# Patient Record
Sex: Male | Born: 1940
Health system: Southern US, Community
[De-identification: ages and names within clinical notes are randomized; demographics above are authoritative.]

## PROBLEM LIST (undated history)

## (undated) DIAGNOSIS — C61 Malignant neoplasm of prostate: Secondary | ICD-10-CM

## (undated) DIAGNOSIS — D494 Neoplasm of unspecified behavior of bladder: Secondary | ICD-10-CM

## (undated) DIAGNOSIS — K219 Gastro-esophageal reflux disease without esophagitis: Secondary | ICD-10-CM

## (undated) DIAGNOSIS — R42 Dizziness and giddiness: Secondary | ICD-10-CM

## (undated) DIAGNOSIS — C801 Malignant (primary) neoplasm, unspecified: Secondary | ICD-10-CM

## (undated) DIAGNOSIS — Z87442 Personal history of urinary calculi: Secondary | ICD-10-CM

## (undated) DIAGNOSIS — C439 Malignant melanoma of skin, unspecified: Secondary | ICD-10-CM

## (undated) DIAGNOSIS — N200 Calculus of kidney: Secondary | ICD-10-CM

## (undated) DIAGNOSIS — M1990 Unspecified osteoarthritis, unspecified site: Secondary | ICD-10-CM

## (undated) DIAGNOSIS — I5189 Other ill-defined heart diseases: Secondary | ICD-10-CM

## (undated) DIAGNOSIS — L570 Actinic keratosis: Secondary | ICD-10-CM

## (undated) DIAGNOSIS — I00 Rheumatic fever without heart involvement: Secondary | ICD-10-CM

## (undated) DIAGNOSIS — I2510 Atherosclerotic heart disease of native coronary artery without angina pectoris: Secondary | ICD-10-CM

## (undated) DIAGNOSIS — Z86018 Personal history of other benign neoplasm: Secondary | ICD-10-CM

## (undated) DIAGNOSIS — I10 Essential (primary) hypertension: Secondary | ICD-10-CM

## (undated) DIAGNOSIS — G4730 Sleep apnea, unspecified: Secondary | ICD-10-CM

## (undated) DIAGNOSIS — K5790 Diverticulosis of intestine, part unspecified, without perforation or abscess without bleeding: Secondary | ICD-10-CM

## (undated) DIAGNOSIS — Z9889 Other specified postprocedural states: Secondary | ICD-10-CM

## (undated) DIAGNOSIS — G4733 Obstructive sleep apnea (adult) (pediatric): Secondary | ICD-10-CM

## (undated) DIAGNOSIS — Z7982 Long term (current) use of aspirin: Secondary | ICD-10-CM

## (undated) DIAGNOSIS — I700 Atherosclerosis of aorta: Secondary | ICD-10-CM

## (undated) DIAGNOSIS — R531 Weakness: Secondary | ICD-10-CM

## (undated) DIAGNOSIS — E785 Hyperlipidemia, unspecified: Secondary | ICD-10-CM

## (undated) HISTORY — PX: MELANOMA EXCISION: SHX5266

## (undated) HISTORY — PX: CHOLECYSTECTOMY: SHX55

## (undated) HISTORY — DX: Actinic keratosis: L57.0

---

## 2001-12-21 HISTORY — PX: PROSTATECTOMY: SHX69

## 2003-12-22 HISTORY — PX: BRAIN SURGERY: SHX531

## 2004-02-19 DIAGNOSIS — Z86018 Personal history of other benign neoplasm: Secondary | ICD-10-CM

## 2004-02-19 HISTORY — PX: CRANIOTOMY: SHX93

## 2004-02-19 HISTORY — DX: Personal history of other benign neoplasm: Z86.018

## 2004-06-13 HISTORY — PX: CRANIOTOMY FOR REPAIR DURAL / CSF LEAK: SUR338

## 2006-11-10 ENCOUNTER — Emergency Department: Payer: Self-pay | Admitting: Emergency Medicine

## 2008-07-10 ENCOUNTER — Emergency Department: Payer: Self-pay | Admitting: Emergency Medicine

## 2008-08-23 DIAGNOSIS — D239 Other benign neoplasm of skin, unspecified: Secondary | ICD-10-CM

## 2008-08-23 HISTORY — DX: Other benign neoplasm of skin, unspecified: D23.9

## 2009-08-21 DIAGNOSIS — C4491 Basal cell carcinoma of skin, unspecified: Secondary | ICD-10-CM

## 2009-08-21 HISTORY — DX: Basal cell carcinoma of skin, unspecified: C44.91

## 2009-10-19 ENCOUNTER — Emergency Department: Payer: Self-pay | Admitting: Emergency Medicine

## 2009-11-11 ENCOUNTER — Emergency Department: Payer: Self-pay | Admitting: Emergency Medicine

## 2009-11-21 ENCOUNTER — Ambulatory Visit: Payer: Self-pay | Admitting: Surgery

## 2010-12-21 HISTORY — PX: BLADDER SURGERY: SHX569

## 2011-01-02 ENCOUNTER — Emergency Department: Payer: Self-pay | Admitting: Unknown Physician Specialty

## 2011-02-02 ENCOUNTER — Ambulatory Visit: Payer: Self-pay | Admitting: Urology

## 2011-02-10 ENCOUNTER — Ambulatory Visit: Payer: Self-pay | Admitting: Urology

## 2011-06-08 ENCOUNTER — Ambulatory Visit: Payer: Self-pay | Admitting: Urology

## 2012-10-18 ENCOUNTER — Ambulatory Visit: Payer: Self-pay | Admitting: Surgery

## 2012-11-01 ENCOUNTER — Ambulatory Visit: Payer: Self-pay | Admitting: Surgery

## 2013-04-10 ENCOUNTER — Ambulatory Visit: Payer: Self-pay | Admitting: Gastroenterology

## 2014-02-14 ENCOUNTER — Emergency Department: Payer: Self-pay | Admitting: Emergency Medicine

## 2014-02-14 LAB — CBC
HCT: 47.2 % (ref 40.0–52.0)
HGB: 16.1 g/dL (ref 13.0–18.0)
MCH: 30.1 pg (ref 26.0–34.0)
MCHC: 34.1 g/dL (ref 32.0–36.0)
MCV: 88 fL (ref 80–100)
Platelet: 351 10*3/uL (ref 150–440)
RBC: 5.33 10*6/uL (ref 4.40–5.90)
RDW: 14 % (ref 11.5–14.5)
WBC: 17.6 10*3/uL — ABNORMAL HIGH (ref 3.8–10.6)

## 2014-02-14 LAB — COMPREHENSIVE METABOLIC PANEL
ALBUMIN: 3.6 g/dL (ref 3.4–5.0)
ALK PHOS: 60 U/L
Anion Gap: 5 — ABNORMAL LOW (ref 7–16)
BUN: 24 mg/dL — ABNORMAL HIGH (ref 7–18)
Bilirubin,Total: 0.3 mg/dL (ref 0.2–1.0)
CHLORIDE: 105 mmol/L (ref 98–107)
CREATININE: 1.25 mg/dL (ref 0.60–1.30)
Calcium, Total: 8.8 mg/dL (ref 8.5–10.1)
Co2: 29 mmol/L (ref 21–32)
EGFR (African American): >60
GFR CALC NON AF AMER: 58 — AB
Glucose: 100 mg/dL — ABNORMAL HIGH (ref 65–99)
Osmolality: 282 (ref 275–301)
Potassium: 3.4 mmol/L — ABNORMAL LOW (ref 3.5–5.1)
SGOT(AST): 32 U/L (ref 15–37)
SGPT (ALT): 40 U/L (ref 12–78)
SODIUM: 139 mmol/L (ref 136–145)
TOTAL PROTEIN: 8 g/dL (ref 6.4–8.2)

## 2014-02-14 LAB — URINALYSIS, COMPLETE: Specific Gravity: 1.026 (ref 1.003–1.030)

## 2015-01-25 DIAGNOSIS — H40003 Preglaucoma, unspecified, bilateral: Secondary | ICD-10-CM | POA: Diagnosis not present

## 2015-02-25 DIAGNOSIS — Z8546 Personal history of malignant neoplasm of prostate: Secondary | ICD-10-CM | POA: Diagnosis not present

## 2015-02-25 DIAGNOSIS — D333 Benign neoplasm of cranial nerves: Secondary | ICD-10-CM | POA: Diagnosis not present

## 2015-02-25 DIAGNOSIS — I10 Essential (primary) hypertension: Secondary | ICD-10-CM | POA: Diagnosis not present

## 2015-03-04 DIAGNOSIS — C61 Malignant neoplasm of prostate: Secondary | ICD-10-CM | POA: Diagnosis not present

## 2015-03-04 DIAGNOSIS — Z85820 Personal history of malignant melanoma of skin: Secondary | ICD-10-CM | POA: Insufficient documentation

## 2015-03-04 DIAGNOSIS — D333 Benign neoplasm of cranial nerves: Secondary | ICD-10-CM | POA: Diagnosis not present

## 2015-03-04 DIAGNOSIS — I10 Essential (primary) hypertension: Secondary | ICD-10-CM | POA: Diagnosis not present

## 2015-03-08 DIAGNOSIS — E042 Nontoxic multinodular goiter: Secondary | ICD-10-CM | POA: Diagnosis not present

## 2015-04-09 NOTE — Op Note (Signed)
PATIENT NAME:  Gregory [name], Gregory [name] MR#:  [id number] DATE OF BIRTH:  10/24/1941  DATE OF PROCEDURE:  11/01/2012  PREOPERATIVE DIAGNOSIS: Umbilical hernia.   POSTOPERATIVE DIAGNOSIS: Umbilical hernia.   PROCEDURE: Umbilical hernia repair.   SURGEON: Rochel Brome, M.D.   ANESTHESIA: General.   INDICATIONS: This 74 year old male has bulging at the navel and the patient was concerned about possible incarceration. He had physical findings of diastases recti, but also a localized hernia which is just above and slightly to the left of the umbilicus. This was some 3 cm in dimension. It appeared large enough to admit small bowel and repair was recommended for definitive treatment.   DESCRIPTION OF PROCEDURE: The patient was placed on the operating table in the supine position under general anesthesia. The periumbilical skin was clipped and then prepared with ChloraPrep and draped in a sterile manner. A transversely oriented suprapubic incision was made some 3.5 cm in length, carried down through subcutaneous tissues to encounter an umbilical hernia sac which was dissected free from surrounding structures and dissected down into a fascial ring defect. The sac itself was some 4 cm in dimension and was dissected free from the fascial ring defect and was inverted. The properitoneal fat was dissected away from the fascia circumferentially. The fascia itself was somewhat thin. There was a small defect just below this which was dissected so that some properitoneal fat was removed from a smaller defect. Next, an Atrium mesh was cut to create an oval shape of some 2 x 3 cm and was placed into the properitoneal plane, oriented transversely and sutured to the fascia below the smaller defect, closing the smaller defect with 0 Surgilon suture. Also, was sutured to the fascia on the cephalad side and did use a tiny pledget of the same mesh. Next, the repair was carried out further with a transversely oriented suture line of  interrupted 0 Surgilon figure-of-eight sutures incorporating each suture into the mesh. The repair looked good. Hemostasis was intact. Subcutaneous tissues were closed with running 4-0 chromic. The skin was closed with running 5-0 Monocryl   subcuticular suture and Dermabond. The patient tolerated surgery satisfactorily and was then prepared for transfer to the recovery room.   ____________________________ Lenna Sciara. Rochel Brome, MD jws:ap D: 11/01/2012 10:44:45 ET T: 11/01/2012 11:37:34 ET JOB#: [id number]  cc: Loreli Dollar, MD, <Dictator> Loreli Dollar MD ELECTRONICALLY SIGNED 11/02/2012 19:57

## 2015-04-27 DIAGNOSIS — R1032 Left lower quadrant pain: Secondary | ICD-10-CM | POA: Diagnosis not present

## 2015-05-02 DIAGNOSIS — R310 Gross hematuria: Secondary | ICD-10-CM | POA: Diagnosis not present

## 2015-05-02 DIAGNOSIS — C679 Malignant neoplasm of bladder, unspecified: Secondary | ICD-10-CM | POA: Diagnosis not present

## 2015-05-23 DIAGNOSIS — E042 Nontoxic multinodular goiter: Secondary | ICD-10-CM | POA: Diagnosis not present

## 2015-06-11 DIAGNOSIS — E042 Nontoxic multinodular goiter: Secondary | ICD-10-CM | POA: Diagnosis not present

## 2015-06-12 ENCOUNTER — Other Ambulatory Visit: Payer: Self-pay

## 2015-06-12 DIAGNOSIS — N3281 Overactive bladder: Secondary | ICD-10-CM

## 2015-06-12 MED ORDER — FESOTERODINE FUMARATE ER 8 MG PO TB24: 8.0000 mg | ORAL_TABLET | Freq: Every day | ORAL | Status: DC

## 2015-06-12 NOTE — Progress Notes (Signed)
Pt called stating he attempted to request a refill of toviaz 8mg  via pharmacy. Pharmacy told him they received he was no longer our pt. Per our records pt is a current pt. Per Mikey Kirschner 8mg  could be reordered. Medication was sent to pharmacy. Pt made aware. Cw,lpn

## 2015-06-13 ENCOUNTER — Other Ambulatory Visit: Payer: Self-pay | Admitting: *Deleted

## 2015-06-13 DIAGNOSIS — L570 Actinic keratosis: Secondary | ICD-10-CM | POA: Diagnosis not present

## 2015-06-13 DIAGNOSIS — Z85820 Personal history of malignant melanoma of skin: Secondary | ICD-10-CM | POA: Diagnosis not present

## 2015-06-13 DIAGNOSIS — D1800 Hemangioma unspecified site: Secondary | ICD-10-CM | POA: Diagnosis not present

## 2015-06-13 DIAGNOSIS — D179 Benign lipomatous neoplasm, unspecified: Secondary | ICD-10-CM | POA: Diagnosis not present

## 2015-06-13 DIAGNOSIS — N3281 Overactive bladder: Secondary | ICD-10-CM

## 2015-06-13 DIAGNOSIS — Z85828 Personal history of other malignant neoplasm of skin: Secondary | ICD-10-CM | POA: Diagnosis not present

## 2015-06-13 DIAGNOSIS — L821 Other seborrheic keratosis: Secondary | ICD-10-CM | POA: Diagnosis not present

## 2015-06-13 DIAGNOSIS — D485 Neoplasm of uncertain behavior of skin: Secondary | ICD-10-CM | POA: Diagnosis not present

## 2015-06-13 DIAGNOSIS — L578 Other skin changes due to chronic exposure to nonionizing radiation: Secondary | ICD-10-CM | POA: Diagnosis not present

## 2015-06-13 MED ORDER — FESOTERODINE FUMARATE ER 8 MG PO TB24: 8.0000 mg | ORAL_TABLET | Freq: Every day | ORAL | Status: DC

## 2015-09-27 DIAGNOSIS — Z23 Encounter for immunization: Secondary | ICD-10-CM | POA: Diagnosis not present

## 2015-10-14 DIAGNOSIS — I10 Essential (primary) hypertension: Secondary | ICD-10-CM | POA: Diagnosis not present

## 2015-10-14 DIAGNOSIS — D333 Benign neoplasm of cranial nerves: Secondary | ICD-10-CM | POA: Diagnosis not present

## 2015-10-14 DIAGNOSIS — Z125 Encounter for screening for malignant neoplasm of prostate: Secondary | ICD-10-CM | POA: Diagnosis not present

## 2015-10-14 DIAGNOSIS — T7840XA Allergy, unspecified, initial encounter: Secondary | ICD-10-CM | POA: Diagnosis not present

## 2015-10-21 DIAGNOSIS — I10 Essential (primary) hypertension: Secondary | ICD-10-CM | POA: Diagnosis not present

## 2015-10-21 DIAGNOSIS — T7840XA Allergy, unspecified, initial encounter: Secondary | ICD-10-CM | POA: Diagnosis not present

## 2015-10-21 DIAGNOSIS — Z0000 Encounter for general adult medical examination without abnormal findings: Secondary | ICD-10-CM | POA: Diagnosis not present

## 2015-10-21 DIAGNOSIS — C61 Malignant neoplasm of prostate: Secondary | ICD-10-CM | POA: Diagnosis not present

## 2015-10-21 DIAGNOSIS — J342 Deviated nasal septum: Secondary | ICD-10-CM | POA: Diagnosis not present

## 2015-10-21 DIAGNOSIS — M1612 Unilateral primary osteoarthritis, left hip: Secondary | ICD-10-CM | POA: Diagnosis not present

## 2015-12-04 DIAGNOSIS — Z85820 Personal history of malignant melanoma of skin: Secondary | ICD-10-CM | POA: Diagnosis not present

## 2015-12-04 DIAGNOSIS — L578 Other skin changes due to chronic exposure to nonionizing radiation: Secondary | ICD-10-CM | POA: Diagnosis not present

## 2015-12-04 DIAGNOSIS — D1800 Hemangioma unspecified site: Secondary | ICD-10-CM | POA: Diagnosis not present

## 2015-12-04 DIAGNOSIS — Z85828 Personal history of other malignant neoplasm of skin: Secondary | ICD-10-CM | POA: Diagnosis not present

## 2015-12-04 DIAGNOSIS — L570 Actinic keratosis: Secondary | ICD-10-CM | POA: Diagnosis not present

## 2015-12-04 DIAGNOSIS — Z1283 Encounter for screening for malignant neoplasm of skin: Secondary | ICD-10-CM | POA: Diagnosis not present

## 2015-12-04 DIAGNOSIS — L820 Inflamed seborrheic keratosis: Secondary | ICD-10-CM | POA: Diagnosis not present

## 2015-12-04 DIAGNOSIS — D485 Neoplasm of uncertain behavior of skin: Secondary | ICD-10-CM | POA: Diagnosis not present

## 2015-12-17 DIAGNOSIS — H04123 Dry eye syndrome of bilateral lacrimal glands: Secondary | ICD-10-CM | POA: Diagnosis not present

## 2015-12-24 DIAGNOSIS — H04123 Dry eye syndrome of bilateral lacrimal glands: Secondary | ICD-10-CM | POA: Diagnosis not present

## 2015-12-25 DIAGNOSIS — E042 Nontoxic multinodular goiter: Secondary | ICD-10-CM | POA: Diagnosis not present

## 2016-01-01 DIAGNOSIS — E042 Nontoxic multinodular goiter: Secondary | ICD-10-CM | POA: Diagnosis not present

## 2016-01-09 ENCOUNTER — Other Ambulatory Visit: Payer: Self-pay | Admitting: Urology

## 2016-01-09 DIAGNOSIS — N3281 Overactive bladder: Secondary | ICD-10-CM

## 2016-02-22 ENCOUNTER — Observation Stay
Admission: EM | Admit: 2016-02-22 | Discharge: 2016-02-25 | Disposition: A | Discharge status: Home / Self Care | Payer: Commercial Managed Care - HMO | Attending: Urology | Admitting: Urology

## 2016-02-22 ENCOUNTER — Encounter: Payer: Self-pay | Admitting: Emergency Medicine

## 2016-02-22 ENCOUNTER — Emergency Department: Payer: Commercial Managed Care - HMO

## 2016-02-22 DIAGNOSIS — D72829 Elevated white blood cell count, unspecified: Secondary | ICD-10-CM | POA: Insufficient documentation

## 2016-02-22 DIAGNOSIS — Z1639 Resistance to other specified antimicrobial drug: Secondary | ICD-10-CM | POA: Diagnosis not present

## 2016-02-22 DIAGNOSIS — B964 Proteus (mirabilis) (morganii) as the cause of diseases classified elsewhere: Secondary | ICD-10-CM | POA: Insufficient documentation

## 2016-02-22 DIAGNOSIS — K7689 Other specified diseases of liver: Secondary | ICD-10-CM | POA: Insufficient documentation

## 2016-02-22 DIAGNOSIS — N202 Calculus of kidney with calculus of ureter: Secondary | ICD-10-CM | POA: Diagnosis not present

## 2016-02-22 DIAGNOSIS — Z79899 Other long term (current) drug therapy: Secondary | ICD-10-CM | POA: Insufficient documentation

## 2016-02-22 DIAGNOSIS — Z888 Allergy status to other drugs, medicaments and biological substances status: Secondary | ICD-10-CM | POA: Insufficient documentation

## 2016-02-22 DIAGNOSIS — Z87442 Personal history of urinary calculi: Secondary | ICD-10-CM | POA: Diagnosis not present

## 2016-02-22 DIAGNOSIS — N200 Calculus of kidney: Secondary | ICD-10-CM | POA: Insufficient documentation

## 2016-02-22 DIAGNOSIS — R109 Unspecified abdominal pain: Secondary | ICD-10-CM | POA: Diagnosis not present

## 2016-02-22 DIAGNOSIS — K5730 Diverticulosis of large intestine without perforation or abscess without bleeding: Secondary | ICD-10-CM | POA: Diagnosis not present

## 2016-02-22 DIAGNOSIS — I10 Essential (primary) hypertension: Secondary | ICD-10-CM | POA: Diagnosis not present

## 2016-02-22 DIAGNOSIS — N179 Acute kidney failure, unspecified: Secondary | ICD-10-CM | POA: Diagnosis not present

## 2016-02-22 DIAGNOSIS — R509 Fever, unspecified: Secondary | ICD-10-CM | POA: Insufficient documentation

## 2016-02-22 DIAGNOSIS — N390 Urinary tract infection, site not specified: Secondary | ICD-10-CM | POA: Insufficient documentation

## 2016-02-22 DIAGNOSIS — Z91041 Radiographic dye allergy status: Secondary | ICD-10-CM | POA: Insufficient documentation

## 2016-02-22 DIAGNOSIS — K429 Umbilical hernia without obstruction or gangrene: Secondary | ICD-10-CM | POA: Insufficient documentation

## 2016-02-22 DIAGNOSIS — Z8546 Personal history of malignant neoplasm of prostate: Secondary | ICD-10-CM | POA: Insufficient documentation

## 2016-02-22 DIAGNOSIS — N201 Calculus of ureter: Secondary | ICD-10-CM | POA: Diagnosis present

## 2016-02-22 HISTORY — DX: Calculus of kidney: N20.0

## 2016-02-22 HISTORY — DX: Essential (primary) hypertension: I10

## 2016-02-22 LAB — CBC WITH DIFFERENTIAL/PLATELET
Basophils Absolute: 0.1 10*3/uL (ref 0–0.1)
Basophils Relative: 1 %
EOS ABS: 0.1 10*3/uL (ref 0–0.7)
EOS PCT: 1 %
HCT: 48.4 % (ref 40.0–52.0)
Hemoglobin: 16.4 g/dL (ref 13.0–18.0)
LYMPHS ABS: 1.6 10*3/uL (ref 1.0–3.6)
Lymphocytes Relative: 11 %
MCH: 30.4 pg (ref 26.0–34.0)
MCHC: 33.9 g/dL (ref 32.0–36.0)
MCV: 89.5 fL (ref 80.0–100.0)
MONO ABS: 0.9 10*3/uL (ref 0.2–1.0)
Monocytes Relative: 6 %
Neutro Abs: 11.5 10*3/uL — ABNORMAL HIGH (ref 1.4–6.5)
Neutrophils Relative %: 81 %
PLATELETS: 237 10*3/uL (ref 150–440)
RBC: 5.41 MIL/uL (ref 4.40–5.90)
RDW: 14 % (ref 11.5–14.5)
WBC: 14.1 10*3/uL — AB (ref 3.8–10.6)

## 2016-02-22 LAB — URINALYSIS COMPLETE WITH MICROSCOPIC (ARMC ONLY)
Bilirubin Urine: NEGATIVE
Glucose, UA: NEGATIVE mg/dL
Ketones, ur: NEGATIVE mg/dL
Nitrite: NEGATIVE
PH: 7 (ref 5.0–8.0)
Protein, ur: 100 mg/dL — AB
SPECIFIC GRAVITY, URINE: 1.017 (ref 1.005–1.030)

## 2016-02-22 LAB — BASIC METABOLIC PANEL
Anion gap: 8 (ref 5–15)
BUN: 23 mg/dL — AB (ref 6–20)
CALCIUM: 9.1 mg/dL (ref 8.9–10.3)
CO2: 28 mmol/L (ref 22–32)
CREATININE: 1.56 mg/dL — AB (ref 0.61–1.24)
Chloride: 104 mmol/L (ref 101–111)
GFR calc Af Amer: 49 mL/min — ABNORMAL LOW (ref >60)
GFR, EST NON AFRICAN AMERICAN: 42 mL/min — AB (ref >60)
GLUCOSE: 155 mg/dL — AB (ref 65–99)
Potassium: 3.7 mmol/L (ref 3.5–5.1)
SODIUM: 140 mmol/L (ref 135–145)

## 2016-02-22 MED ORDER — OXYCODONE HCL 5 MG PO TABS
5.0000 mg | ORAL_TABLET | ORAL | Status: DC | PRN
  Administered 2016-02-22: 10 mg via ORAL
  Filled 2016-02-22: qty 2

## 2016-02-22 MED ORDER — FESOTERODINE FUMARATE ER 4 MG PO TB24
8.0000 mg | ORAL_TABLET | Freq: Every day | ORAL | Status: DC
  Administered 2016-02-24 – 2016-02-25 (×2): 8 mg via ORAL
  Filled 2016-02-22: qty 2
  Filled 2016-02-22: qty 1
  Filled 2016-02-22: qty 2

## 2016-02-22 MED ORDER — ATENOLOL 25 MG PO TABS
25.0000 mg | ORAL_TABLET | Freq: Every day | ORAL | Status: DC
  Administered 2016-02-23 – 2016-02-25 (×3): 25 mg via ORAL
  Filled 2016-02-22 (×3): qty 1

## 2016-02-22 MED ORDER — MORPHINE SULFATE (PF) 4 MG/ML IV SOLN
6.0000 mg | Freq: Once | INTRAVENOUS | Status: AC
  Administered 2016-02-22: 6 mg via INTRAVENOUS
  Filled 2016-02-22: qty 1

## 2016-02-22 MED ORDER — LOSARTAN POTASSIUM-HCTZ 100-25 MG PO TABS: 1.0000 | ORAL_TABLET | ORAL | Status: DC

## 2016-02-22 MED ORDER — MORPHINE SULFATE (PF) 2 MG/ML IV SOLN
2.0000 mg | Freq: Once | INTRAVENOUS | Status: AC
  Administered 2016-02-22: 2 mg via INTRAVENOUS
  Filled 2016-02-22: qty 1

## 2016-02-22 MED ORDER — ONDANSETRON HCL 4 MG/2ML IJ SOLN
4.0000 mg | INTRAMUSCULAR | Status: DC | PRN
  Administered 2016-02-22 – 2016-02-23 (×3): 4 mg via INTRAVENOUS
  Filled 2016-02-22 (×3): qty 2

## 2016-02-22 MED ORDER — HYDROMORPHONE HCL 1 MG/ML IJ SOLN
0.5000 mg | INTRAMUSCULAR | Status: DC | PRN
  Administered 2016-02-22 (×2): 0.5 mg via INTRAVENOUS
  Filled 2016-02-22 (×2): qty 1

## 2016-02-22 MED ORDER — DOCUSATE SODIUM 100 MG PO CAPS
100.0000 mg | ORAL_CAPSULE | Freq: Two times a day (BID) | ORAL | Status: DC
  Administered 2016-02-22 – 2016-02-25 (×5): 100 mg via ORAL
  Filled 2016-02-22 (×5): qty 1

## 2016-02-22 MED ORDER — DEXTROSE 5 % IV SOLN
1.0000 g | Freq: Once | INTRAVENOUS | Status: AC
  Administered 2016-02-22: 1 g via INTRAVENOUS
  Filled 2016-02-22: qty 10

## 2016-02-22 MED ORDER — ONDANSETRON HCL 4 MG/2ML IJ SOLN
4.0000 mg | Freq: Once | INTRAMUSCULAR | Status: AC
  Administered 2016-02-22 (×2): 4 mg via INTRAVENOUS
  Filled 2016-02-22: qty 2

## 2016-02-22 MED ORDER — LACTATED RINGERS IV SOLN
INTRAVENOUS | Status: DC
  Administered 2016-02-22 – 2016-02-25 (×8): via INTRAVENOUS

## 2016-02-22 MED ORDER — ONDANSETRON HCL 4 MG/2ML IJ SOLN
INTRAMUSCULAR | Status: AC
  Administered 2016-02-22: 4 mg via INTRAVENOUS
  Filled 2016-02-22: qty 2

## 2016-02-22 MED ORDER — HYDROCHLOROTHIAZIDE 25 MG PO TABS
25.0000 mg | ORAL_TABLET | ORAL | Status: DC
  Administered 2016-02-24 – 2016-02-25 (×2): 25 mg via ORAL
  Filled 2016-02-22 (×3): qty 1

## 2016-02-22 MED ORDER — SODIUM CHLORIDE 0.9 % IV BOLUS (SEPSIS)
1000.0000 mL | Freq: Once | INTRAVENOUS | Status: AC
Start: 2016-02-22 — End: 2016-02-22
  Administered 2016-02-22: 1000 mL via INTRAVENOUS

## 2016-02-22 MED ORDER — TAMSULOSIN HCL 0.4 MG PO CAPS
0.4000 mg | ORAL_CAPSULE | Freq: Every day | ORAL | Status: DC
  Administered 2016-02-22 – 2016-02-25 (×3): 0.4 mg via ORAL
  Filled 2016-02-22 (×3): qty 1

## 2016-02-22 MED ORDER — LOSARTAN POTASSIUM 50 MG PO TABS
100.0000 mg | ORAL_TABLET | ORAL | Status: DC
  Administered 2016-02-24 – 2016-02-25 (×2): 100 mg via ORAL
  Filled 2016-02-22 (×3): qty 2

## 2016-02-22 MED ORDER — MORPHINE SULFATE (PF) 4 MG/ML IV SOLN
INTRAVENOUS | Status: AC
  Administered 2016-02-22: 6 mg via INTRAVENOUS
  Filled 2016-02-22: qty 1

## 2016-02-22 NOTE — H&P (Addendum)
Gregory [name] is an 75 y.o. male.   Chief Complaint: left ureteral stone HPI:   75 y.o. male with a history of kidney stones who presents with flank pain.  The pain started at 10 am and progressed.  This was located at the left flank and then he developed some radiation of the pain anteriorly.  He states his pain intensity worsened and had associated nausea but no vomiting.  He denies any fever or rigors at home. He has had 6 stones in the past and has not required intervention for these. His last stone episode was 15 to 20 years ago.  He has a history of prostate cancer treated in 2003 with radical prostatectomy. No radiation.  He also has a history of a benign bladder lesion noted in 2011.  He is not sure what it was specifically.    Past Medical History  Diagnosis Date  . Kidney stones   . Hypertension     History reviewed. No pertinent past surgical history.  History reviewed. No pertinent family history. Social History:  reports that he has never smoked. He does not have any smokeless tobacco history on file. He reports that he does not drink alcohol. His drug history is not on file.  Allergies:  Allergies  Allergen Reactions  . Iodine Anaphylaxis  . Other     Other reaction(s): Other (See Comments) Uncoded Allergy. Allergen: SHELLFISH, Other Reaction: FACIAL/THROAT SWELLING    Medications Prior to Admission  Medication Sig Dispense Refill  . atenolol (TENORMIN) 25 MG tablet Take 25 mg by mouth daily.    . fluorometholone (FML) 0.1 % ophthalmic suspension Place 1 drop into both eyes daily as needed. For dry/itchy eyes.    Marland Kitchen losartan-hydrochlorothiazide (HYZAAR) 100-25 MG tablet Take 1 tablet by mouth every morning.    . TOVIAZ 8 MG TB24 tablet TAKE ONE TABLET BY MOUTH EVERY DAY 30 each 3    Results for orders placed or performed during the hospital encounter of 02/22/16 (from the past 48 hour(s))  Urinalysis complete, with microscopic ([facility] only)     Status: Abnormal    Collection Time: 02/22/16 12:00 PM  Result Value Ref Range   Color, Urine YELLOW (A) YELLOW   APPearance CLOUDY (A) CLEAR   Glucose, UA NEGATIVE NEGATIVE mg/dL   Bilirubin Urine NEGATIVE NEGATIVE   Ketones, ur NEGATIVE NEGATIVE mg/dL   Specific Gravity, Urine 1.017 1.005 - 1.030   Hgb urine dipstick 3+ (A) NEGATIVE   pH 7.0 5.0 - 8.0   Protein, ur 100 (A) NEGATIVE mg/dL   Nitrite NEGATIVE NEGATIVE   Leukocytes, UA 3+ (A) NEGATIVE   RBC / HPF TOO NUMEROUS TO COUNT 0 - 5 RBC/hpf   WBC, UA TOO NUMEROUS TO COUNT 0 - 5 WBC/hpf   Bacteria, UA MANY (A) NONE SEEN   Squamous Epithelial / LPF 0-5 (A) NONE SEEN   WBC Clumps PRESENT    Mucous PRESENT    Hyaline Casts, UA PRESENT   CBC with Differential     Status: Abnormal   Collection Time: 02/22/16 12:00 PM  Result Value Ref Range   WBC 14.1 (H) 3.8 - 10.6 K/uL   RBC 5.41 4.40 - 5.90 MIL/uL   Hemoglobin 16.4 13.0 - 18.0 g/dL   HCT 48.4 40.0 - 52.0 %   MCV 89.5 80.0 - 100.0 fL   MCH 30.4 26.0 - 34.0 pg   MCHC 33.9 32.0 - 36.0 g/dL   RDW 14.0 11.5 - 14.5 %  Platelets 237 150 - 440 K/uL   Neutrophils Relative % 81 %   Neutro Abs 11.5 (H) 1.4 - 6.5 K/uL   Lymphocytes Relative 11 %   Lymphs Abs 1.6 1.0 - 3.6 K/uL   Monocytes Relative 6 %   Monocytes Absolute 0.9 0.2 - 1.0 K/uL   Eosinophils Relative 1 %   Eosinophils Absolute 0.1 0 - 0.7 K/uL   Basophils Relative 1 %   Basophils Absolute 0.1 0 - 0.1 K/uL  Basic metabolic panel     Status: Abnormal   Collection Time: 02/22/16 12:00 PM  Result Value Ref Range   Sodium 140 135 - 145 mmol/L   Potassium 3.7 3.5 - 5.1 mmol/L   Chloride 104 101 - 111 mmol/L   CO2 28 22 - 32 mmol/L   Glucose, Bld 155 (H) 65 - 99 mg/dL   BUN 23 (H) 6 - 20 mg/dL   Creatinine, Ser 1.56 (H) 0.61 - 1.24 mg/dL   Calcium 9.1 8.9 - 10.3 mg/dL   GFR calc non Af Amer 42 (L) >60 mL/min   GFR calc Af Amer 49 (L) >60 mL/min    Comment: (NOTE) The eGFR has been calculated using the CKD EPI equation. This  calculation has not been validated in all clinical situations. eGFR's persistently <60 mL/min signify possible Chronic Kidney Disease.    Anion gap 8 5 - 15   Ct Renal Stone Study  02/22/2016  CLINICAL DATA:  Acute left-sided flank pain since 10 o'clock this morning. EXAM: CT ABDOMEN AND PELVIS WITHOUT CONTRAST TECHNIQUE: Multidetector CT imaging of the abdomen and pelvis was performed following the standard protocol without IV contrast. COMPARISON:  02/14/2014; 07/10/2008 FINDINGS: The lack of intravenous contrast limits the ability to evaluate solid abdominal organs. There is a punctate (approximately 0.4 cm) stone within the superior aspect of the left ureter (representative axial image 44, series 2, coronal image 80, series 5) which results an very mild upstream ureterectasis, pelvicaliectasis and slightly asymmetric left-sided perinephric stranding. Note is made of an additional punctate (approximately 3 mm) nonobstructing stone within the interpolar aspect of the left kidney (image 34, series 2). No definite right-sided renal stones. No stones are seen along the expected course of the right ureter. Note is made of a right-sided extrarenal pelvis. No evidence of right-sided urinary obstruction. _____________________________________________________________ Normal hepatic contour. Approximately 4 cm hypo attenuating lesion within the dome of the left lobe of the liver (image 11, series 2) as well as the approximately 1.2 cm hypo attenuating lesion within the dome of the right lobe of the liver (image 12) and an approximately 0.8 cm hypoattenuating lesion also within the right lobe of the liver (image 15, series 2) are incompletely characterized without intravenous contrast though appear morphologically unchanged since 01/2014 examination and thus favored to represent hepatic cysts. Post cholecystectomy. No ascites. Normal noncontrast appearance of the bilateral adrenal glands, pancreas and spleen. Scattered  colonic diverticulosis without evidence of diverticulitis on this noncontrast examination. Normal noncontrast appearance of the terminal ileum and appendix. No pneumoperitoneum, pneumatosis or portal venous gas. Scattered atherosclerotic plaque within a normal caliber abdominal aorta. Post prostatectomy. Normal noncontrast appearance of the urinary bladder. No free fluid in the pelvic cul-de-sac. Limited visualization of lower thorax demonstrates minimal dependent subpleural ground-glass atelectasis, right greater than left. No focal airspace opacities. No pleural effusion. Normal heart size.  No pericardial effusion. Mild (< 25%) compression deformity involving the superior endplate of the P10 vertebral body is grossly unchanged since the  01/2014 examination. No acute or aggressive osseous abnormalities. Stigmata of DISH within the caudal aspect of the thoracic spine. Small (approximately 2.0 x 2.5 cm mesenteric fat containing periumbilical hernia appears grossly unchanged since the 01/2014 examination. Regional soft tissues appear otherwise normal. IMPRESSION: 1. Punctate (approximately 4 mm) stone within the superior aspect of the left ureter results in very mild upstream ureterectasis and pelvicaliectasis. 2. Additional punctate (approximately 3 mm) nonobstructing left-sided renal stone. 3. No evidence of right-sided nephrolithiasis urinary obstruction. 4. Colonic diverticulosis without evidence of diverticulitis. Electronically Signed   By: Sandi Mariscal M.D.   On: 02/22/2016 13:08    Review of Systems  Constitutional: Negative for fever, chills, weight loss and malaise/fatigue.  HENT: Negative for ear pain and hearing loss.   Eyes: Negative for blurred vision.  Respiratory: Negative for cough, hemoptysis, wheezing and stridor.   Cardiovascular: Negative for chest pain, palpitations and orthopnea.  Gastrointestinal: Positive for nausea. Negative for heartburn, vomiting, abdominal pain and diarrhea.   Genitourinary: Positive for urgency, frequency and flank pain. Negative for dysuria and hematuria.  Musculoskeletal: Negative for myalgias, back pain and neck pain.  Skin: Negative for itching and rash.  Neurological: Negative for dizziness, seizures and headaches.  Endo/Heme/Allergies: Bruises/bleeds easily.  Psychiatric/Behavioral: Negative for depression and suicidal ideas.    BP 161/80 mmHg  Pulse 108  Temp(Src) 97.7 F (36.5 C) (Oral)  Resp 18  Ht 5' 8"  (1.727 m)  Wt 92.534 kg (204 lb)  BMI 31.03 kg/m2  SpO2 99%   General: well-developed male, in no acute distress HEENT: PERRL, EOMI, OP moist without lesions Neck: supple, trachea midline; no thyromegaly Chest: normal to palpation Lungs: clear bilaterally without wheeze or retractions Cardiac: RRR without murmurs, gallops, rubs; distal pulses 2+ Abd: Soft, nontender, nondistended, positive bowel sounds.  No organomegaly. He has an infrapubic incision that is well healed.   Ext: No clubbing, cyanosis, edema Neuro: CN grossly intact. Moves all extremities Derm: no significant rashes or nodules Lymph: no cervical or supraclavicular nodes    Assessment/Plan  1) Left ureteral stone  75 y.o. male with a 4 mm left ureteral stone in the mid ureter. He has intractable pain and a mildly elevated WBC. His UA is suggestive of a possible infection but has negative nitrite. I discussed with him the options regarding management of an acute stone episode including observation and ureteral stenting.  Given he has now improved pain control and he has not had fevers, he wishes to forego surgical intervention.    We discussed the parameters that would require that we proceed to the OR for ureteral stenting and that includes fevers, chills, intractable pain, vomiting.  We will monitor him overnight and make a final decision tomorrow. If his WBC is improved and his pain is controlled then will proceed with discharge and close outpatient  followup.   Will start flomax, will strain urine  2) AKI -   AKI likely due to dehydration will treat with IV fluids.  3) Hepatic Cysts  He has incidental cysts in his liver and will have his PCP follow up on these.    I spent >60 minutes in face to face discussion with the patient and his wife.    Pau Banh J Madden-Fuentes 02/22/2016, 5:23 PM

## 2016-02-22 NOTE — ED Provider Notes (Signed)
[facility] Emergency Department Provider Note    ____________________________________________  Time seen: ~1215  I have reviewed the triage vital signs and the nursing notes.   HISTORY  Chief Complaint Flank Pain   History limited by: Not Limited   HPI Gregory [name] is a 75 y.o. male who presents to the emergency department today because of concerns for left flank pain. It started suddenly. It started roughly 2 hours ago. The pain is severe. He describes it as sharp. It does not radiate. He did have some associated nausea and vomiting. He states he has a history of kidney stones and this reminds him of previous kidney stone pain. He did not notice any change in urination however. No change in defecation. No fevers.     Past Medical History  Diagnosis Date  . Kidney stones   . Hypertension     There are no active problems to display for this patient.   History reviewed. No pertinent past surgical history.  Current Outpatient Rx  Name  Route  Sig  Dispense  Refill  . TOVIAZ 8 MG TB24 tablet      TAKE ONE TABLET BY MOUTH EVERY DAY   30 each   3     Allergies Iodine  History reviewed. No pertinent family history.  Social History Social History  Substance Use Topics  . Smoking status: Never Smoker   . Smokeless tobacco: None  . Alcohol Use: No    Review of Systems  Constitutional: Negative for fever. Cardiovascular: Negative for chest pain. Respiratory: Negative for shortness of breath. Gastrointestinal: Positive for left flank pain, nausea and vomiting Genitourinary: Negative for dysuria. Musculoskeletal: Negative for back pain. Skin: Negative for rash. Neurological: Negative for headaches, focal weakness or numbness.  10-point ROS otherwise negative.  ____________________________________________   PHYSICAL EXAM:  VITAL SIGNS: ED Triage Vitals  Enc Vitals Group     BP 02/22/16 1151 177/95 mmHg     Pulse Rate  02/22/16 1151 68     Resp 02/22/16 1151 20     Temp 02/22/16 1151 98.2 F (36.8 C)     Temp Source 02/22/16 1151 Oral     SpO2 02/22/16 1151 100 %     Weight 02/22/16 1151 204 lb (92.534 kg)     Height 02/22/16 1151 5\' 8"  (1.727 m)     Head Cir --      Peak Flow --      Pain Score 02/22/16 1151 7   Constitutional: Alert and oriented. Appears uncomfortable. Eyes: Conjunctivae are normal. PERRL. Normal extraocular movements. ENT   Head: Normocephalic and atraumatic.   Nose: No congestion/rhinnorhea.   Mouth/Throat: Mucous membranes are moist.   Neck: No stridor. Hematological/Lymphatic/Immunilogical: No cervical lymphadenopathy. Cardiovascular: Normal rate, regular rhythm.  No murmurs, rubs, or gallops. Respiratory: Normal respiratory effort without tachypnea nor retractions. Breath sounds are clear and equal bilaterally. No wheezes/rales/rhonchi. Gastrointestinal: Soft and nontender. No distention.  Genitourinary: Deferred Musculoskeletal: Normal range of motion in all extremities. No joint effusions.  No lower extremity tenderness nor edema. Neurologic:  Normal speech and language. No gross focal neurologic deficits are appreciated.  Skin:  Skin is warm, dry and intact. No rash noted. Psychiatric: Mood and affect are normal. Speech and behavior are normal. Patient exhibits appropriate insight and judgment.  ____________________________________________    LABS (pertinent positives/negatives)  Labs Reviewed  URINALYSIS COMPLETEWITH MICROSCOPIC ([place] ONLY) - Abnormal; Notable for the following:    Color, Urine YELLOW (*)  APPearance CLOUDY (*)    Hgb urine dipstick 3+ (*)    Protein, ur 100 (*)    Leukocytes, UA 3+ (*)    Bacteria, UA MANY (*)    Squamous Epithelial / LPF 0-5 (*)    All other components within normal limits  CBC WITH DIFFERENTIAL/PLATELET - Abnormal; Notable for the following:    WBC 14.1 (*)    Neutro Abs 11.5 (*)    All other components  within normal limits  BASIC METABOLIC PANEL - Abnormal; Notable for the following:    Glucose, Bld 155 (*)    BUN 23 (*)    Creatinine, Ser 1.56 (*)    GFR calc non Af Amer 42 (*)    GFR calc Af Amer 49 (*)    All other components within normal limits     ____________________________________________   EKG  None  ____________________________________________    RADIOLOGY  CT renal IMPRESSION: 1. Punctate (approximately 4 mm) stone within the superior aspect of the left ureter results in very mild upstream ureterectasis and pelvicaliectasis. 2. Additional punctate (approximately 3 mm) nonobstructing left-sided renal stone. 3. No evidence of right-sided nephrolithiasis urinary obstruction. 4. Colonic diverticulosis without evidence of diverticulitis.   ____________________________________________   PROCEDURES  Procedure(s) performed: None  Critical Care performed: No  ____________________________________________   INITIAL IMPRESSION / ASSESSMENT AND PLAN / ED COURSE  Pertinent labs & imaging results that were available during my care of the patient were reviewed by me and considered in my medical decision making (see chart for details).  Patient presented to the emergency department today because of concerns for left flank pain. Urine was concerning for both too numerous to count red and white blood cells. Because of this a CT renal study was obtained which did show a kidney stone in the left ureter. Given my concern for infections patient was written for antibiotics. Additionally I discussed with urology who will plan on admitting patient for further care.  ____________________________________________   FINAL CLINICAL IMPRESSION(S) / ED DIAGNOSES  Final diagnoses:  Left flank pain  Kidney stone     Nance Pear, MD 02/22/16 1559

## 2016-02-22 NOTE — Care Management Obs Status (Signed)
[place] NOTIFICATION   Patient Details  Name: Gregory [name] MRN: [id number] Date of Birth: Nov 26, 1941   Medicare Observation Status Notification Given:  Yes (ureteral stone)    Ival Bible, RN 02/22/2016, 2:28 PM

## 2016-02-22 NOTE — ED Notes (Signed)
Pt to ed with c/o acute onset of left flank pain that started about 1 1/2 hours ago.  Hx of kidney stones.

## 2016-02-23 ENCOUNTER — Encounter
Admission: EM | Disposition: A | Discharge status: Home / Self Care | Payer: Self-pay | Source: Home / Self Care | Attending: Emergency Medicine

## 2016-02-23 ENCOUNTER — Encounter: Payer: Self-pay | Admitting: Anesthesiology

## 2016-02-23 ENCOUNTER — Observation Stay: Payer: Commercial Managed Care - HMO | Admitting: Anesthesiology

## 2016-02-23 DIAGNOSIS — N201 Calculus of ureter: Secondary | ICD-10-CM | POA: Diagnosis not present

## 2016-02-23 DIAGNOSIS — I10 Essential (primary) hypertension: Secondary | ICD-10-CM | POA: Diagnosis not present

## 2016-02-23 HISTORY — PX: CYSTOSCOPY WITH STENT PLACEMENT: SHX5790

## 2016-02-23 LAB — BASIC METABOLIC PANEL
ANION GAP: 10 (ref 5–15)
BUN: 27 mg/dL — ABNORMAL HIGH (ref 6–20)
CALCIUM: 8.3 mg/dL — AB (ref 8.9–10.3)
CHLORIDE: 103 mmol/L (ref 101–111)
CO2: 27 mmol/L (ref 22–32)
CREATININE: 2.09 mg/dL — AB (ref 0.61–1.24)
GFR calc Af Amer: 34 mL/min — ABNORMAL LOW (ref >60)
GFR calc non Af Amer: 30 mL/min — ABNORMAL LOW (ref >60)
GLUCOSE: 124 mg/dL — AB (ref 65–99)
Potassium: 3.4 mmol/L — ABNORMAL LOW (ref 3.5–5.1)
Sodium: 140 mmol/L (ref 135–145)

## 2016-02-23 LAB — CBC
HCT: 41.7 % (ref 40.0–52.0)
HEMOGLOBIN: 14.4 g/dL (ref 13.0–18.0)
MCH: 30.9 pg (ref 26.0–34.0)
MCHC: 34.7 g/dL (ref 32.0–36.0)
MCV: 89.2 fL (ref 80.0–100.0)
Platelets: 141 10*3/uL — ABNORMAL LOW (ref 150–440)
RBC: 4.67 MIL/uL (ref 4.40–5.90)
RDW: 14.2 % (ref 11.5–14.5)
WBC: 19 10*3/uL — ABNORMAL HIGH (ref 3.8–10.6)

## 2016-02-23 SURGERY — REMOVAL, STENT, URETER, CYSTOSCOPIC
Anesthesia: General | Laterality: Left

## 2016-02-23 SURGERY — CYSTOSCOPY, WITH STENT INSERTION
Anesthesia: General | Laterality: Left | Wound class: Clean Contaminated

## 2016-02-23 SURGERY — CYSTOSCOPY, WITH STENT INSERTION
Anesthesia: Choice | Laterality: Left

## 2016-02-23 MED ORDER — OXYCODONE HCL 5 MG PO TABS: 5.0000 mg | ORAL_TABLET | Freq: Once | ORAL | Status: DC | PRN

## 2016-02-23 MED ORDER — FENTANYL CITRATE (PF) 100 MCG/2ML IJ SOLN: 25.0000 ug | INTRAMUSCULAR | Status: DC | PRN

## 2016-02-23 MED ORDER — ONDANSETRON HCL 4 MG/2ML IJ SOLN
INTRAMUSCULAR | Status: DC | PRN
  Administered 2016-02-23: 4 mg via INTRAVENOUS

## 2016-02-23 MED ORDER — DEXTROSE 5 % IV SOLN
1.0000 g | INTRAVENOUS | Status: DC
  Administered 2016-02-23 – 2016-02-24 (×2): 1 g via INTRAVENOUS
  Filled 2016-02-23 (×3): qty 10

## 2016-02-23 MED ORDER — PHENYLEPHRINE HCL 10 MG/ML IJ SOLN
INTRAMUSCULAR | Status: DC | PRN
  Administered 2016-02-23: 200 ug via INTRAVENOUS
  Administered 2016-02-23 (×2): 100 ug via INTRAVENOUS
  Administered 2016-02-23: 150 ug via INTRAVENOUS

## 2016-02-23 MED ORDER — CEFAZOLIN SODIUM 1 G IJ SOLR
INTRAMUSCULAR | Status: DC | PRN
  Administered 2016-02-23: 2 g via INTRAMUSCULAR

## 2016-02-23 MED ORDER — OXYCODONE HCL 5 MG/5ML PO SOLN: 5.0000 mg | Freq: Once | ORAL | Status: DC | PRN

## 2016-02-23 MED ORDER — DEXAMETHASONE SODIUM PHOSPHATE 10 MG/ML IJ SOLN
INTRAMUSCULAR | Status: DC | PRN
  Administered 2016-02-23: 10 mg via INTRAVENOUS

## 2016-02-23 SURGICAL SUPPLY — 23 items
BAG DRAIN CYSTO-URO LG1000N (MISCELLANEOUS) ×3 IMPLANT
BAG URO DRAIN 2000ML W/SPOUT (MISCELLANEOUS) ×3 IMPLANT
CATH FOL 2WAY LX 16X5 (CATHETERS) ×3 IMPLANT
CATH URETL 5X70 OPEN END (CATHETERS) ×3 IMPLANT
CONRAY 43 FOR UROLOGY 50M (MISCELLANEOUS) IMPLANT
GLOVE BIO SURGEON STRL SZ7 (GLOVE) ×12 IMPLANT
GLOVE BIO SURGEON STRL SZ7.5 (GLOVE) IMPLANT
GOWN STRL REUS W/ TWL LRG LVL4 (GOWN DISPOSABLE) ×2 IMPLANT
GOWN STRL REUS W/TWL LRG LVL4 (GOWN DISPOSABLE) ×4
GUIDEWIRE STR ZIPWIRE 035X150 (MISCELLANEOUS) ×3 IMPLANT
IV NS 1000ML (IV SOLUTION) ×4
IV NS 1000ML BAXH (IV SOLUTION) ×2 IMPLANT
KIT RM TURNOVER CYSTO AR (KITS) ×3 IMPLANT
PACK CYSTO AR (MISCELLANEOUS) ×3 IMPLANT
PREP PVP WINGED SPONGE (MISCELLANEOUS) IMPLANT
SET CYSTO W/LG BORE CLAMP LF (SET/KITS/TRAYS/PACK) ×3 IMPLANT
SOL .9 NS 3000ML IRR  AL (IV SOLUTION)
SOL .9 NS 3000ML IRR UROMATIC (IV SOLUTION) IMPLANT
SOL PREP PVP 2OZ (MISCELLANEOUS) ×3
SOLUTION PREP PVP 2OZ (MISCELLANEOUS) ×1 IMPLANT
STENT URET 6FRX24 CONTOUR (STENTS) IMPLANT
STENT URET 6FRX26 CONTOUR (STENTS) ×3 IMPLANT
WATER STERILE IRR 1000ML POUR (IV SOLUTION) ×3 IMPLANT

## 2016-02-23 NOTE — Anesthesia Procedure Notes (Signed)
Procedure Name: LMA Insertion Date/Time: 02/23/2016 11:01 AM Performed by: Jonna Clark Pre-anesthesia Checklist: Patient identified, Patient being monitored, Timeout performed, Emergency Drugs available and Suction available Patient Re-evaluated:Patient Re-evaluated prior to inductionOxygen Delivery Method: Circle system utilized Preoxygenation: Pre-oxygenation with 100% oxygen Intubation Type: IV induction Ventilation: Mask ventilation without difficulty LMA: LMA inserted LMA Size: 4.0 Tube type: Oral Number of attempts: 1 Placement Confirmation: positive ETCO2 and breath sounds checked- equal and bilateral Tube secured with: Tape Dental Injury: Teeth and Oropharynx as per pre-operative assessment

## 2016-02-23 NOTE — Transfer of Care (Signed)
Immediate Anesthesia Transfer of Care Note  Patient: Gregory [name]  Procedure(s) Performed: Procedure(s): CYSTOSCOPY WITH STENT PLACEMENT (Left)  Patient Location: PACU  Anesthesia Type:General  Level of Consciousness: patient cooperative and lethargic  Airway & Oxygen Therapy: Patient Spontanous Breathing and Patient connected to face mask oxygen  Post-op Assessment: Report given to RN and Post -op Vital signs reviewed and stable  Post vital signs: Reviewed and stable  Last Vitals:  Filed Vitals:   02/23/16 1136 02/23/16 1138  BP: 90/57 90/57  Pulse: 81   Temp:  36.9 C  Resp: 11 16    Complications: No apparent anesthesia complications

## 2016-02-23 NOTE — Op Note (Signed)
PREOPERATIVE DIAGNOSIS:  Left Ureteral stone   POSTOPERATIVE DIAGNOSIS:  Left ureteral stone   SURGERY DATE: 02/23/2016  PROCEDURE:  1) Cystourethroscopy  2) Left ureteral stent placement 3) Fluoroscopy   ATTENDING SURGEON:  . Delmer Islam Madden-Fuentes, MD   ANESTHESIA:  * No anesthesia type entered *   ESTIMATED BLOOD LOSS:  minimal  SPECIMENS:  None.   DRAINS:  A 6-French x 26 cm  double-J ureteral stent.   INDICATIONS FOR PROCEDURE:   This is a 75 y.o. male who was admitted yesterday with a left ureteral stone and pain.  His WBC was borderline and thus was kept for pain control and monitoring. This AM, his blood work revealed a rise in creatinine and rise in WBC.  The decision was made to proceed to the OR for stent placement  We discussed the risks associated with surgery including bleeding, infection, damage to surrounding structures, need for secondary procedures. He is aware there is a risk of heart attack, stroke, DVTs, and even death.     DESCRIPTION OF PROCEDURE IN DETAIL:  After informed consent was obtained the patient was brought to the operative theater and placed on the operating room table in the supine position.  Anesthesia was induced.  The patient was then placed in the dorsal lithotomy position. The patient was prepped and draped in the usual sterile fashion.  The patient was given appropriate antibiotics within 1 hour at the start of the case.  Bilateral sequential compression devices were placed on his legs for DVT prophylaxis.  A surgical time-out occurred with all in the room in agreement with the surgery to be performed and the site of surgery.     A 23-French rigid cystoscope was placed in the patient's bladder via his urethra.  The urethra was noted to be normal with no stricture or scarring.  Upon entry into the bladder it was evaluated, and found to be normal.  The patient's left ureteral orifice was identified, and a 0.038 Sensor wire was used to cannulate  it.  A 5 french open ended ureteral catheter was then advanced into the ureter over the wire. The wire was removed and an urine sample collected.  A sensor guidewire was then advanced through the ureteral catheter and placed up the ureter and advanced to the level of the renal pelvis under fluoroscopic guidance.    Next a 6-French x 26 cm double-J ureteral stent was placed over the wire and advanced to the level of the renal pelvis under fluoroscopic guidance.  When the stent was noted to be within the correct position the Sensor wire was removed, and a good coil was seen within the renal pelvis by fluoroscopy as well as within the bladder both by fluoroscopy and by direct vision with the use of the cystoscope.  The patient's bladder was then drained, the cystoscope was removed.  The patient tolerated the procedure well, and postoperatively was transported to the PACU in stable condition.

## 2016-02-23 NOTE — Progress Notes (Signed)
Subjective: Gregory [name] is a 75 y.o. male patient admitted for left ureteral stone.  His UA was positive for a possible infection and thus he was admitted for observation.  He did well overnight without fevers. He's been urinating. His major complaint is dry mouth and being hungry. He was kept NPO in case he needed to go to the operating room today for a left ureteral stent placement.   Objective: Filed Vitals:   02/22/16 2051 02/23/16 0616  BP: 112/69 123/63  Pulse: 98 99  Temp: 97.8 F (36.6 C) 98.1 F (36.7 C)  Resp: 16 16    General: well-developed male, in no acute distress HEENT: Atraumatic, moist membranes Neck: supple, trachea midline Lungs: moving air without restriction and without use of accessory muscles Cardiac: Regular rate Abd: Soft, nontender, nondistended Ext: No edema Neuro: CN grossly intact. Moves all extremities   Lab Results  Component Value Date   CREATININE 2.09* 02/23/2016   CREATININE 1.56* 02/22/2016   CREATININE 1.25 02/14/2014   CBC Latest Ref Rng 02/23/2016 02/22/2016 02/14/2014  WBC 3.8 - 10.6 K/uL 19.0(H) 14.1(H) 17.6(H)  Hemoglobin 13.0 - 18.0 g/dL 14.4 16.4 16.1  Hematocrit 40.0 - 52.0 % 41.7 48.4 47.2  Platelets 150 - 440 K/uL 141(L) 237 351    Allergies: Allergies  Allergen Reactions  . Iodine Anaphylaxis  . Other     Other reaction(s): Other (See Comments) Uncoded Allergy. Allergen: SHELLFISH, Other Reaction: FACIAL/THROAT SWELLING     Assessment/Plan: Left ureteral stone.   He has had a rise in his creatinine and his WBC is now 19k. We discussed at length that this is concerning for infection behind his ureteral stone.  Thus, we will need to proceed with a left ureteral stent placement. I have discussed this at length his him and his wife. They both understand the importance of proceeding to ensure that he does not develop worsening infection and possible sepsis.  We discussed the risks associated with surgery including  bleeding, infection, damage to surrounding structures, need for secondary procedures. He is aware there is a risk of heart attack, stroke, DVTs, and even death.    I have obtained informed consent from him.    Aviva Signs, MD 02/23/2016

## 2016-02-23 NOTE — Anesthesia Postprocedure Evaluation (Signed)
Anesthesia Post Note  Patient: Terrie Kollin Shultis  Procedure(s) Performed: Procedure(s) (LRB): CYSTOSCOPY WITH STENT PLACEMENT (Left)  Patient location during evaluation: PACU Anesthesia Type: General Level of consciousness: awake and alert Pain management: pain level controlled Vital Signs Assessment: post-procedure vital signs reviewed and stable Respiratory status: spontaneous breathing, nonlabored ventilation, respiratory function stable and patient connected to nasal cannula oxygen Cardiovascular status: blood pressure returned to baseline and stable Postop Assessment: no signs of nausea or vomiting Anesthetic complications: no    Last Vitals:  Filed Vitals:   02/23/16 1206 02/23/16 1217  BP:    Pulse: 80 80  Temp:  36.8 C  Resp: 12 12    Last Pain:  Filed Vitals:   02/23/16 1226  PainSc: 0-No pain                 Precious Haws Raziah Funnell

## 2016-02-23 NOTE — OR Nursing (Signed)
Urine appears bloody no clotts noted

## 2016-02-23 NOTE — Anesthesia Preprocedure Evaluation (Signed)
Anesthesia Evaluation  Patient identified by MRN, date of birth, ID band Patient awake    Reviewed: Allergy & Precautions, H&P , NPO status , Patient's Chart, lab work & pertinent test results  History of Anesthesia Complications Negative for: history of anesthetic complications  Airway Mallampati: III  TM Distance: >3 FB Neck ROM: limited    Dental  (+) Poor Dentition, Chipped   Pulmonary neg pulmonary ROS, neg shortness of breath,    Pulmonary exam normal breath sounds clear to auscultation       Cardiovascular Exercise Tolerance: Good hypertension, (-) angina(-) Past MI and (-) DOE Normal cardiovascular exam Rhythm:regular Rate:Normal     Neuro/Psych negative neurological ROS  negative psych ROS   GI/Hepatic negative GI ROS, Neg liver ROS, neg GERD  ,  Endo/Other  negative endocrine ROS  Renal/GU Renal diseasenegative Renal ROS  negative genitourinary   Musculoskeletal   Abdominal   Peds  Hematology negative hematology ROS (+)   Anesthesia Other Findings Past Medical History:   Kidney stones                                                Hypertension                                                History reviewed. No pertinent surgical history.  BMI    Body Mass Index   31.02 kg/m 2    Signs and symptoms suggestive of sleep apnea    Reproductive/Obstetrics negative OB ROS                             Anesthesia Physical Anesthesia Plan  ASA: III  Anesthesia Plan: General LMA   Post-op Pain Management:    Induction:   Airway Management Planned:   Additional Equipment:   Intra-op Plan:   Post-operative Plan:   Informed Consent: I have reviewed the patients History and Physical, chart, labs and discussed the procedure including the risks, benefits and alternatives for the proposed anesthesia with the patient or authorized representative who has indicated his/her  understanding and acceptance.   Dental Advisory Given  Plan Discussed with: Anesthesiologist, CRNA and Surgeon  Anesthesia Plan Comments:         Anesthesia Quick Evaluation

## 2016-02-23 NOTE — Brief Op Note (Signed)
02/22/2016 - 02/23/2016  11:31 AM  PATIENT:  Gregory [name]  75 y.o. male  PRE-OPERATIVE DIAGNOSIS:  left ureteral stone  POST-OPERATIVE DIAGNOSIS: left ureteral stone  PROCEDURE:  Procedure(s): CYSTOSCOPY WITH STENT PLACEMENT (Left)  SURGEON:  Surgeon(s) and Role:    * Dereck Leep, MD - Primary  PHYSICIAN ASSISTANT:   ANESTHESIA:   general  EBL:  Total I/O In: 627 [I.V.:627] Out: -   BLOOD ADMINISTERED:none  DRAINS: Urinary Catheter (Foley)    SPECIMEN:  Source of Specimen:  urine  DISPOSITION OF SPECIMEN:  N/A  COUNTS:  YES  TOURNIQUET:  * No tourniquets in log *  DICTATION: .Dragon Dictation  PLAN OF CARE: Admit to inpatient   PATIENT DISPOSITION:  PACU - hemodynamically stable.   Delay start of Pharmacological VTE agent (>24hrs) due to surgical blood loss or risk of bleeding: no

## 2016-02-24 ENCOUNTER — Encounter: Payer: Self-pay | Admitting: Urology

## 2016-02-24 DIAGNOSIS — N200 Calculus of kidney: Secondary | ICD-10-CM | POA: Diagnosis not present

## 2016-02-24 DIAGNOSIS — N201 Calculus of ureter: Secondary | ICD-10-CM | POA: Diagnosis not present

## 2016-02-24 DIAGNOSIS — R109 Unspecified abdominal pain: Secondary | ICD-10-CM

## 2016-02-24 LAB — URINE CULTURE: Culture: NO GROWTH

## 2016-02-24 LAB — CBC
HCT: 37.4 % — ABNORMAL LOW (ref 40.0–52.0)
HEMOGLOBIN: 12.7 g/dL — AB (ref 13.0–18.0)
MCH: 30.3 pg (ref 26.0–34.0)
MCHC: 34 g/dL (ref 32.0–36.0)
MCV: 89.2 fL (ref 80.0–100.0)
PLATELETS: 149 10*3/uL — AB (ref 150–440)
RBC: 4.19 MIL/uL — AB (ref 4.40–5.90)
RDW: 14.9 % — ABNORMAL HIGH (ref 11.5–14.5)
WBC: 25.2 10*3/uL — ABNORMAL HIGH (ref 3.8–10.6)

## 2016-02-24 LAB — BASIC METABOLIC PANEL
Anion gap: 8 (ref 5–15)
BUN: 33 mg/dL — AB (ref 6–20)
CHLORIDE: 103 mmol/L (ref 101–111)
CO2: 26 mmol/L (ref 22–32)
Calcium: 7.9 mg/dL — ABNORMAL LOW (ref 8.9–10.3)
Creatinine, Ser: 1.84 mg/dL — ABNORMAL HIGH (ref 0.61–1.24)
GFR calc Af Amer: 40 mL/min — ABNORMAL LOW (ref >60)
GFR calc non Af Amer: 35 mL/min — ABNORMAL LOW (ref >60)
GLUCOSE: 120 mg/dL — AB (ref 65–99)
POTASSIUM: 3.6 mmol/L (ref 3.5–5.1)
Sodium: 137 mmol/L (ref 135–145)

## 2016-02-24 MED ORDER — TAMSULOSIN HCL 0.4 MG PO CAPS: 0.4000 mg | ORAL_CAPSULE | Freq: Every day | ORAL | Status: DC

## 2016-02-24 MED ORDER — OXYCODONE HCL 5 MG PO TABS: 5.0000 mg | ORAL_TABLET | ORAL | Status: DC | PRN

## 2016-02-24 MED ORDER — DOCUSATE SODIUM 100 MG PO CAPS: 100.0000 mg | ORAL_CAPSULE | Freq: Two times a day (BID) | ORAL | Status: DC

## 2016-02-24 MED ORDER — AMOXICILLIN-POT CLAVULANATE 875-125 MG PO TABS: 1.0000 | ORAL_TABLET | Freq: Two times a day (BID) | ORAL | Status: DC

## 2016-02-24 NOTE — Progress Notes (Signed)
Urology Follow Up  Subjective: No fevers, chills.  WBC rising.  UCx growing proteus sensitive to ceftriaxone.  Foley remains in place.  No fevers.  Tolerating diet.    Anti-infectives: Anti-infectives    Start     Dose/Rate Route Frequency Ordered Stop   02/23/16 1600  cefTRIAXone (ROCEPHIN) 1 g in dextrose 5 % 50 mL IVPB     1 g 100 mL/hr over 30 Minutes Intravenous Every 24 hours 02/23/16 1236     02/22/16 1230  cefTRIAXone (ROCEPHIN) 1 g in dextrose 5 % 50 mL IVPB     1 g 100 mL/hr over 30 Minutes Intravenous  Once 02/22/16 1229 02/22/16 1503      Current Facility-Administered Medications  Medication Dose Route Frequency Provider Last Rate Last Dose  . atenolol (TENORMIN) tablet 25 mg  25 mg Oral Daily Ramiro J Madden-Fuentes, MD   25 mg at 02/24/16 1005  . cefTRIAXone (ROCEPHIN) 1 g in dextrose 5 % 50 mL IVPB  1 g Intravenous Q24H Ramiro J Madden-Fuentes, MD   1 g at 02/23/16 1622  . docusate sodium (COLACE) capsule 100 mg  100 mg Oral BID Delmer Islam Madden-Fuentes, MD   100 mg at 02/24/16 1005  . fesoterodine (TOVIAZ) tablet 8 mg  8 mg Oral Daily Ramiro J Madden-Fuentes, MD   8 mg at 02/24/16 1005  . hydrochlorothiazide (HYDRODIURIL) tablet 25 mg  25 mg Oral Q24H Charlett Nose, RPH   25 mg at 02/24/16 [id number]  . HYDROmorphone (DILAUDID) injection 0.5 mg  0.5 mg Intravenous Q3H PRN Delmer Islam Madden-Fuentes, MD   0.5 mg at 02/22/16 2138  . lactated ringers infusion   Intravenous Continuous Delmer Islam Madden-Fuentes, MD 125 mL/hr at 02/24/16 1005    . losartan (COZAAR) tablet 100 mg  100 mg Oral Q24H Charlett Nose, RPH   100 mg at 02/24/16 [id number]  . ondansetron (ZOFRAN) injection 4 mg  4 mg Intravenous Q4H PRN Delmer Islam Madden-Fuentes, MD   4 mg at 02/23/16 0936  . oxyCODONE (Oxy IR/ROXICODONE) immediate release tablet 5-10 mg  5-10 mg Oral Q4H PRN Delmer Islam Madden-Fuentes, MD   10 mg at 02/22/16 1922  . tamsulosin (FLOMAX) capsule 0.4 mg  0.4 mg Oral Daily Ramiro J Madden-Fuentes, MD    0.4 mg at 02/24/16 1005     Objective: Vital signs in last 24 hours: Temp:  [97.6 F (36.4 C)-98.8 F (37.1 C)] 98.8 F (37.1 C) (03/06 1256) Pulse Rate:  [65-80] 73 (03/06 1256) Resp:  [15-20] 15 (03/06 1256) BP: (104-125)/(58-63) 125/63 mmHg (03/06 1256) SpO2:  [93 %-95 %] 95 % (03/06 1256)  Intake/Output from previous day: 03/05 0701 - 03/06 0700 In: 2119.6 [P.O.:240; I.V.:1829.6; IV Piggyback:50] Out: 1180 [Urine:1180] Intake/Output this shift: Total I/O In: 1124 [I.V.:1124] Out: 600 [Urine:600]   Physical Exam  Constitutional: He is oriented to person, place, and time and well-developed, well-nourished, and in no distress.  HENT:  Head: Normocephalic and atraumatic.  Neck: Normal range of motion. Neck supple.  Cardiovascular: Normal rate.   Pulmonary/Chest: Effort normal. No respiratory distress.  Abdominal: Soft. Bowel sounds are normal. He exhibits no distension. There is no tenderness.  Genitourinary: Penis normal.  Foley in place draining clear yellow urine  Musculoskeletal: Normal range of motion. He exhibits no edema.  Neurological: He is alert and oriented to person, place, and time.  Skin: Skin is warm and dry.  Psychiatric: Affect normal.    Lab Results:   Recent Labs  02/23/16 0536 02/24/16 0540  WBC 19.0* 25.2*  HGB 14.4 12.7*  HCT 41.7 37.4*  PLT 141* 149*   BMET  Recent Labs  02/23/16 0536 02/24/16 0540  NA 140 137  K 3.4* 3.6  CL 103 103  CO2 27 26  GLUCOSE 124* 120*  BUN 27* 33*  CREATININE 2.09* 1.84*  CALCIUM 8.3* 7.9*    Studies/Results:   2d ago    Specimen Description URINE, RANDOM   Special Requests NONE   Culture >=100,000 COLONIES/mL PROTEUS MIRABILIS   Report Status 02/24/2016 FINAL   Organism ID, Bacteria PROTEUS MIRABILIS   Resulting Agency [facility]    Culture & Susceptibility      PROTEUS MIRABILIS     Antibiotic Sensitivity Microscan Status    AMPICILLIN Sensitive <=2 SENSITIVE Final     Method: MIC    AMPICILLIN/SULBACTAM Sensitive <=2 SENSITIVE Final    Method: MIC    CEFAZOLIN Sensitive <=4 SENSITIVE Final    Method: MIC    CEFTRIAXONE Sensitive <=1 SENSITIVE Final    Method: MIC    CIPROFLOXACIN Sensitive <=0.25 SENSITIVE Final    Method: MIC    GENTAMICIN Sensitive <=1 SENSITIVE Final    Method: MIC    IMIPENEM Sensitive 2 SENSITIVE Final    Method: MIC    NITROFURANTOIN Resistant 128 RESISTANT Final    Method: MIC    PIP/TAZO Sensitive <=4 SENSITIVE Final    Method: MIC    TRIMETH/SULFA Sensitive <=20 SENSITIVE Final    Method: MIC            Assessment: s/p Procedure(s): CYSTOSCOPY WITH STENT PLACEMENT  Plan: Continue ceftriaxone D/c Foley Recheck WBC in AM Regular diet  Likely d/c home in AM      [place] 02/24/2016

## 2016-02-24 NOTE — Discharge Instructions (Signed)
You have a ureteral stent in place.  This is a tube that extends from your kidney to your bladder.  This may cause urinary bleeding, burning with urination, and urinary frequency.  Please call our office or present to the ED if you develop fevers >101 or pain which is not able to be controlled with oral pain medications.  You may be given either Flomax and/ or ditropan to help with bladder spasms and stent pain in addition to pain medications.    Your stent will eventually need to be removed and cannot remain in place.  You will need to follow up to discuss definitive stone procedure, stent removal.    [facility] [address], [place],  [address] [phone]

## 2016-02-24 NOTE — Progress Notes (Signed)
A/O.  VSS.  No complaints of pain.  Foley was removed this afternoon and patient has voided since then.  Tolerating his diet well.  WBC actually went up.  Continuing with the IV abx.  We have encouraged the patient to ambulate out in the hallway, but he keeps putting it off.  He states he has been ambulating in the room.  Plan os for discharge tomorrow.

## 2016-02-25 DIAGNOSIS — R109 Unspecified abdominal pain: Secondary | ICD-10-CM | POA: Diagnosis not present

## 2016-02-25 DIAGNOSIS — N201 Calculus of ureter: Secondary | ICD-10-CM | POA: Diagnosis not present

## 2016-02-25 DIAGNOSIS — N200 Calculus of kidney: Secondary | ICD-10-CM | POA: Diagnosis not present

## 2016-02-25 LAB — CBC
HEMATOCRIT: 39 % — AB (ref 40.0–52.0)
HEMOGLOBIN: 13.2 g/dL (ref 13.0–18.0)
MCH: 30.2 pg (ref 26.0–34.0)
MCHC: 33.8 g/dL (ref 32.0–36.0)
MCV: 89.4 fL (ref 80.0–100.0)
Platelets: 140 10*3/uL — ABNORMAL LOW (ref 150–440)
RBC: 4.36 MIL/uL — ABNORMAL LOW (ref 4.40–5.90)
RDW: 14.6 % — ABNORMAL HIGH (ref 11.5–14.5)
WBC: 13.9 10*3/uL — ABNORMAL HIGH (ref 3.8–10.6)

## 2016-02-25 NOTE — Discharge Summary (Signed)
Date of admission: 02/22/2016  Date of discharge: 02/25/2016  Admission diagnosis: Left ureteral stone                                       Fevers                                       Leukocytosis                                       UTI  Discharge diagnosis: same as above  Secondary diagnoses:  Patient Active Problem List   Diagnosis Date Noted  . Left ureteral stone 02/22/2016  . Kidney stone   . Left flank pain     History and Physical: For full details, please see admission history and physical. Briefly, Gregory [name] is a 75 y.o. year old patient with obstructing left ureteral stone who is admitted for IV antibiotics and observation. Unfortunately, his white count continued to rise therefore is taken to the operating room for left ureteral stent placement.Marland Kitchen   Hospital Course: Following stent placement, patient remained on IV antibiotics. His white count rose on postoperative day 1 to 25.2 and was kept overnight for continued observation. His Foley catheter was removed. His white count trended downwards on the severity to knees discharged in stable condition.   Laboratory values:   Recent Labs  02/23/16 0536 02/24/16 0540 02/25/16 0438  WBC 19.0* 25.2* 13.9*  HGB 14.4 12.7* 13.2  HCT 41.7 37.4* 39.0*    Recent Labs  02/22/16 1200 02/23/16 0536 02/24/16 0540  NA 140 140 137  K 3.7 3.4* 3.6  CL 104 103 103  CO2 28 27 26   GLUCOSE 155* 124* 120*  BUN 23* 27* 33*  CREATININE 1.56* 2.09* 1.84*  CALCIUM 9.1 8.3* 7.9*   No results for input(s): LABPT, INR in the last 72 hours. No results for input(s): LABURIN in the last 72 hours. Results for orders placed or performed during the hospital encounter of 02/22/16  Urine culture     Status: None   Collection Time: 02/22/16 12:00 PM  Result Value Ref Range Status   Specimen Description URINE, RANDOM  Final   Special Requests NONE  Final   Culture >=100,000 COLONIES/mL PROTEUS MIRABILIS  Final   Report Status  02/24/2016 FINAL  Final   Organism ID, Bacteria PROTEUS MIRABILIS  Final      Susceptibility   Proteus mirabilis - MIC*    AMPICILLIN <=2 SENSITIVE Sensitive     CEFAZOLIN <=4 SENSITIVE Sensitive     CEFTRIAXONE <=1 SENSITIVE Sensitive     CIPROFLOXACIN <=0.25 SENSITIVE Sensitive     GENTAMICIN <=1 SENSITIVE Sensitive     IMIPENEM 2 SENSITIVE Sensitive     NITROFURANTOIN 128 RESISTANT Resistant     TRIMETH/SULFA <=20 SENSITIVE Sensitive     AMPICILLIN/SULBACTAM <=2 SENSITIVE Sensitive     PIP/TAZO <=4 SENSITIVE Sensitive     * >=100,000 COLONIES/mL PROTEUS MIRABILIS  Urine culture     Status: None   Collection Time: 02/23/16 11:12 AM  Result Value Ref Range Status   Specimen Description URINE, RANDOM  Final   Special Requests NONE  Final   Culture NO GROWTH 1 DAY  Final   Report Status 02/24/2016 FINAL  Final   Filed Vitals:   02/24/16 2032 02/25/16 0630  BP: 144/74 140/72  Pulse: 76 83  Temp: 98.8 F (37.1 C) 98.7 F (37.1 C)  Resp: 18 17    Physical Exam  Constitutional: He is oriented to person, place, and time. He appears well-developed and well-nourished.  HENT:  Head: Normocephalic and atraumatic.  Neck: Normal range of motion. Neck supple.  Cardiovascular: Normal rate.   Abdominal: Soft. Bowel sounds are normal. He exhibits no distension. There is no tenderness.  Genitourinary: Penis normal.  Musculoskeletal: Normal range of motion.  Neurological: He is alert and oriented to person, place, and time.  Skin: Skin is warm and dry.     Disposition: Home  Discharge instruction:  You have a ureteral stent in place.  This is a tube that extends from your kidney to your bladder.  This may cause urinary bleeding, burning with urination, and urinary frequency.  Please call our office or present to the ED if you develop fevers >101 or pain which is not able to be controlled with oral pain medications.  You may be given either Flomax and/ or ditropan to help with bladder  spasms and stent pain in addition to pain medications.    [facility] [address], [place], [place] [address] [phone]   Discharge medications:   Medication List    TAKE these medications        amoxicillin-clavulanate 875-125 MG tablet  Commonly known as:  AUGMENTIN  Take 1 tablet by mouth 2 (two) times daily.     atenolol 25 MG tablet  Commonly known as:  TENORMIN  Take 25 mg by mouth daily.     docusate sodium 100 MG capsule  Commonly known as:  COLACE  Take 1 capsule (100 mg total) by mouth 2 (two) times daily.     fluorometholone 0.1 % ophthalmic suspension  Commonly known as:  FML  Place 1 drop into both eyes daily as needed. For dry/itchy eyes.     losartan-hydrochlorothiazide 100-25 MG tablet  Commonly known as:  HYZAAR  Take 1 tablet by mouth every morning.     oxyCODONE 5 MG immediate release tablet  Commonly known as:  Oxy IR/ROXICODONE  Take 1-2 tablets (5-10 mg total) by mouth every 4 (four) hours as needed (1 tablet for pain 4-7/10, 2 tablets for pain 8-10/10).     tamsulosin 0.4 MG Caps capsule  Commonly known as:  FLOMAX  Take 1 capsule (0.4 mg total) by mouth daily.     TOVIAZ 8 MG Tb24 tablet  Generic drug:  fesoterodine  TAKE ONE TABLET BY MOUTH EVERY DAY        Followup:      Follow-up Information    Follow up with Hollice Espy, MD In 1 week.   Specialty:  Urology   Contact information:   [address] [place] [address] [phone]

## 2016-02-25 NOTE — Progress Notes (Signed)
Patient discharge teaching given, including activity, diet, follow-up appoints, and medications. Patient verbalized understanding of all discharge instructions. IV access was d/c'd. Vitals are stable. Skin is intact except as charted in most recent assessments. Pt to be escorted out by volunteer, to be driven home by family.  Farrie Sann E Hobbs  

## 2016-03-06 ENCOUNTER — Telehealth: Payer: Self-pay | Admitting: Radiology

## 2016-03-06 ENCOUNTER — Ambulatory Visit (INDEPENDENT_AMBULATORY_CARE_PROVIDER_SITE_OTHER): Payer: Commercial Managed Care - HMO | Admitting: Urology

## 2016-03-06 ENCOUNTER — Encounter: Payer: Self-pay | Admitting: Urology

## 2016-03-06 VITALS — BP 155/87 | HR 76 | Temp 97.7°F | Resp 16 | Ht 68.0 in | Wt 201.0 lb

## 2016-03-06 DIAGNOSIS — Z8546 Personal history of malignant neoplasm of prostate: Secondary | ICD-10-CM | POA: Diagnosis not present

## 2016-03-06 DIAGNOSIS — N393 Stress incontinence (female) (male): Secondary | ICD-10-CM

## 2016-03-06 DIAGNOSIS — Z01818 Encounter for other preprocedural examination: Secondary | ICD-10-CM | POA: Diagnosis not present

## 2016-03-06 DIAGNOSIS — N201 Calculus of ureter: Secondary | ICD-10-CM | POA: Diagnosis not present

## 2016-03-06 NOTE — Telephone Encounter (Signed)
Notified pt of surgery scheduled 03/16/16, pre-admit testing appt on 3/20 @9 :00 and to call Friday prior to surgery for arrival to [facility]. Pt voices understanding.

## 2016-03-06 NOTE — Progress Notes (Deleted)
03/06/2016 4:01 PM   Gregory [name] 03/12/1941 [id number]  Referring provider: Tracie Harrier, MD [address] [facility] [place], [place] [address]  Chief Complaint  Patient presents with  . Pre-op Exam  . Hospitalization Follow-up    HPI: ***     PMH: Past Medical History  Diagnosis Date  . Kidney stones   . Hypertension     Surgical History: Past Surgical History  Procedure Laterality Date  . Cystoscopy with stent placement Left 02/23/2016    Procedure: CYSTOSCOPY WITH STENT PLACEMENT;  Surgeon: Dereck Leep, MD;  Location: [facility] ORS;  Service: Urology;  Laterality: Left;  . Cholecystectomy    . Prostatectomy    . Melanoma excision      Home Medications:    Medication List       This list is accurate as of: 03/06/16  4:01 PM.  Always use your most recent med list.               atenolol 25 MG tablet  Commonly known as:  TENORMIN  Take 25 mg by mouth daily.     docusate sodium 100 MG capsule  Commonly known as:  COLACE  Take 1 capsule (100 mg total) by mouth 2 (two) times daily.     fluorometholone 0.1 % ophthalmic suspension  Commonly known as:  FML  Place 1 drop into both eyes daily as needed. For dry/itchy eyes.     losartan-hydrochlorothiazide 100-25 MG tablet  Commonly known as:  HYZAAR  Take 1 tablet by mouth every morning.     oxyCODONE 5 MG immediate release tablet  Commonly known as:  Oxy IR/ROXICODONE  Take 1-2 tablets (5-10 mg total) by mouth every 4 (four) hours as needed (1 tablet for pain 4-7/10, 2 tablets for pain 8-10/10).     tamsulosin 0.4 MG Caps capsule  Commonly known as:  FLOMAX  Take 1 capsule (0.4 mg total) by mouth daily.     TOVIAZ 8 MG Tb24 tablet  Generic drug:  fesoterodine  TAKE ONE TABLET BY MOUTH EVERY DAY        Allergies:  Allergies  Allergen Reactions  . Iodine Anaphylaxis  . Other     Other reaction(s): Other (See Comments) Uncoded Allergy. Allergen: SHELLFISH,  Other Reaction: FACIAL/THROAT SWELLING    Family History: History reviewed. No pertinent family history.  Social History:  reports that he has never smoked. He has never used smokeless tobacco. He reports that he does not drink alcohol or use illicit drugs.  ROS: UROLOGY Frequent Urination?: Yes Hard to postpone urination?: Yes Burning/pain with urination?: No Get up at night to urinate?: Yes Leakage of urine?: No Urine stream starts and stops?: No Trouble starting stream?: No Do you have to strain to urinate?: No Blood in urine?: No Urinary tract infection?: No Sexually transmitted disease?: No Injury to kidneys or bladder?: No Painful intercourse?: No Weak stream?: No Erection problems?: No Penile pain?: No  Gastrointestinal Nausea?: No Vomiting?: No Indigestion/heartburn?: No Diarrhea?: No Constipation?: No  Constitutional Fever: No Night sweats?: No Weight loss?: No Fatigue?: No  Skin Skin rash/lesions?: No Itching?: No  Eyes Blurred vision?: No Double vision?: No  Ears/Nose/Throat Sore throat?: No Sinus problems?: No  Hematologic/Lymphatic Swollen glands?: No Easy bruising?: No  Cardiovascular Leg swelling?: No Chest pain?: No  Respiratory Cough?: No Shortness of breath?: No  Endocrine Excessive thirst?: No  Musculoskeletal Back pain?: No Joint pain?: No  Neurological Headaches?: No Dizziness?: No  Psychologic Depression?: No Anxiety?: No  Physical Exam: BP 155/87 mmHg  Pulse 76  Temp(Src) 97.7 F (36.5 C) (Oral)  Resp 16  Ht 5\' 8"  (1.727 m)  Wt 201 lb (91.173 kg)  BMI 30.57 kg/m2  SpO2 98%  Constitutional:  Alert and oriented, No acute distress. HEENT: [place] AT, moist mucus membranes.  Trachea midline, no masses. Cardiovascular: No clubbing, cyanosis, or edema. Respiratory: Normal respiratory effort, no increased work of breathing. GI: Abdomen is soft, nontender, nondistended, no abdominal masses GU: No CVA tenderness.  *** Skin: No rashes, bruises or suspicious lesions. Lymph: No cervical or inguinal adenopathy. Neurologic: Grossly intact, no focal deficits, moving all 4 extremities. Psychiatric: Normal mood and affect.  Laboratory Data: Lab Results  Component Value Date   WBC 13.9* 02/25/2016   HGB 13.2 02/25/2016   HCT 39.0* 02/25/2016   MCV 89.4 02/25/2016   PLT 140* 02/25/2016    Lab Results  Component Value Date   CREATININE 1.84* 02/24/2016    No results found for: PSA  No results found for: TESTOSTERONE  No results found for: HGBA1C  Urinalysis    Component Value Date/Time   COLORURINE YELLOW* 02/22/2016 1200   COLORURINE RED 02/14/2014 1933   APPEARANCEUR CLOUDY* 02/22/2016 1200   APPEARANCEUR BLOODY 02/14/2014 1933   LABSPEC 1.017 02/22/2016 1200   LABSPEC 1.026 02/14/2014 1933   PHURINE 7.0 02/22/2016 1200   PHURINE see comment 02/14/2014 1933   GLUCOSEU NEGATIVE 02/22/2016 1200   GLUCOSEU see comment 02/14/2014 1933   HGBUR 3+* 02/22/2016 1200   HGBUR see comment 02/14/2014 1933   BILIRUBINUR NEGATIVE 02/22/2016 1200   BILIRUBINUR see comment 02/14/2014 1933   KETONESUR NEGATIVE 02/22/2016 1200   KETONESUR see comment 02/14/2014 1933   PROTEINUR 100* 02/22/2016 1200   PROTEINUR see comment 02/14/2014 1933   NITRITE NEGATIVE 02/22/2016 1200   NITRITE SEE COMMENT 02/14/2014 1933   LEUKOCYTESUR 3+* 02/22/2016 1200   LEUKOCYTESUR see comment 02/14/2014 1933    Pertinent Imaging: ***  Assessment & Plan:  ***  1. Pre-operative exam *** - Urinalysis, Complete - Urine culture   No Follow-up on file.  Hollice Espy, MD  [facility] [address], [place], [place] [address] [phone]

## 2016-03-06 NOTE — Progress Notes (Signed)
03/06/2016 4:01 PM   Gregory [name] [birth year] [id number]  Referring provider: Tracie Harrier, MD [address] [facility] [place], [place] [address]  Chief Complaint  Patient presents with  . Pre-op Exam  . Hospitalization Follow-up    HPI:  75 year old male recently discharged from the hospital following left ureteral stent placement for an obstructing ureteral stone. He was initially admitted for medical expulsive therapy, however, his white count continued to rise and therefore ultimately underwent stent placement in the setting of urinary tract infection. He has since completed his course of antibiotics and is feeling much better. He returns to the office today to discuss definitive management of his stone.  He also has multiple other urologic issues addressed today as below.  Left ureteral stone  4 mm left midureteral stone, leukocytosis, positive urine culture growing Proteus sensitive to most antibiotics except nitrofurantoin. S/p ureteral stent on 02/23/16.   3 mm L nonobstructing stone, no stones on right. 6 other previous remote stone episodes which has not required surgical intervention. His last episode was approximately 20 years ago.  History of benign bladder growth  TURBT in 2012, PUNLMP Cystoscopy annually, last 11 months ago  History of prostate cancer S/p open radical prostatectomy 2013 PSA 09/2015 0.01, stable  SUI Rare leakage  PMH: Past Medical History  Diagnosis Date  . Kidney stones   . Hypertension     Surgical History: Past Surgical History  Procedure Laterality Date  . Cystoscopy with stent placement Left 02/23/2016    Procedure: CYSTOSCOPY WITH STENT PLACEMENT;  Surgeon: Dereck Leep, MD;  Location: [facility] ORS;  Service: Urology;  Laterality: Left;  . Cholecystectomy    . Prostatectomy    . Melanoma excision      Home Medications:    Medication List       This list is accurate as of: 03/06/16  4:01 PM.   Always use your most recent med list.               atenolol 25 MG tablet  Commonly known as:  TENORMIN  Take 25 mg by mouth daily.     docusate sodium 100 MG capsule  Commonly known as:  COLACE  Take 1 capsule (100 mg total) by mouth 2 (two) times daily.     fluorometholone 0.1 % ophthalmic suspension  Commonly known as:  FML  Place 1 drop into both eyes daily as needed. For dry/itchy eyes.     losartan-hydrochlorothiazide 100-25 MG tablet  Commonly known as:  HYZAAR  Take 1 tablet by mouth every morning.     oxyCODONE 5 MG immediate release tablet  Commonly known as:  Oxy IR/ROXICODONE  Take 1-2 tablets (5-10 mg total) by mouth every 4 (four) hours as needed (1 tablet for pain 4-7/10, 2 tablets for pain 8-10/10).     tamsulosin 0.4 MG Caps capsule  Commonly known as:  FLOMAX  Take 1 capsule (0.4 mg total) by mouth daily.     TOVIAZ 8 MG Tb24 tablet  Generic drug:  fesoterodine  TAKE ONE TABLET BY MOUTH EVERY DAY        Allergies:  Allergies  Allergen Reactions  . Iodine Anaphylaxis  . Other     Other reaction(s): Other (See Comments) Uncoded Allergy. Allergen: SHELLFISH, Other Reaction: FACIAL/THROAT SWELLING    Family History: History reviewed. No pertinent family history.  Social History:  reports that he has never smoked. He has never used smokeless tobacco. He reports that  he does not drink alcohol or use illicit drugs.  ROS: UROLOGY Frequent Urination?: Yes Hard to postpone urination?: Yes Burning/pain with urination?: No Get up at night to urinate?: Yes Leakage of urine?: No Urine stream starts and stops?: No Trouble starting stream?: No Do you have to strain to urinate?: No Blood in urine?: No Urinary tract infection?: No Sexually transmitted disease?: No Injury to kidneys or bladder?: No Painful intercourse?: No Weak stream?: No Erection problems?: No Penile pain?: No  Gastrointestinal Nausea?: No Vomiting?: No Indigestion/heartburn?:  No Diarrhea?: No Constipation?: No  Constitutional Fever: No Night sweats?: No Weight loss?: No Fatigue?: No  Skin Skin rash/lesions?: No Itching?: No  Eyes Blurred vision?: No Double vision?: No  Ears/Nose/Throat Sore throat?: No Sinus problems?: No  Hematologic/Lymphatic Swollen glands?: No Easy bruising?: No  Cardiovascular Leg swelling?: No Chest pain?: No  Respiratory Cough?: No Shortness of breath?: No  Endocrine Excessive thirst?: No  Musculoskeletal Back pain?: No Joint pain?: No  Neurological Headaches?: No Dizziness?: No  Psychologic Depression?: No Anxiety?: No  Physical Exam: BP 155/87 mmHg  Pulse 76  Temp(Src) 97.7 F (36.5 C) (Oral)  Resp 16  Ht 5\' 8"  (1.727 m)  Wt 201 lb (91.173 kg)  BMI 30.57 kg/m2  SpO2 98%  Constitutional:  Alert and oriented, No acute distress. HEENT: [place] AT, moist mucus membranes.  Trachea midline, no masses. Cardiovascular: No clubbing, cyanosis, or edema. RRR. Respiratory: Normal respiratory effort, no increased work of breathing.  CTAB. GI: Abdomen is soft, nontender, nondistended, no abdominal masses GU: No CVA tenderness.  Skin: No rashes, bruises or suspicious lesions. Neurologic: Grossly intact, no focal deficits, moving all 4 extremities. Psychiatric: Normal mood and affect.  Laboratory Data: Lab Results  Component Value Date   WBC 13.9* 02/25/2016   HGB 13.2 02/25/2016   HCT 39.0* 02/25/2016   MCV 89.4 02/25/2016   PLT 140* 02/25/2016    Lab Results  Component Value Date   CREATININE 1.84* 02/24/2016    Urinalysis Results for orders placed or performed in visit on 03/06/16  Urine culture  Result Value Ref Range   Urine Culture, Routine Final report    Urine Culture result 1 No growth   Microscopic Examination  Result Value Ref Range   WBC, UA 0-5 0 -  5 /hpf   RBC, UA >30 (A) 0 -  2 /hpf   Epithelial Cells (non renal) 0-10 0 - 10 /hpf   Bacteria, UA None seen None seen/Few    Urinalysis, Complete  Result Value Ref Range   Specific Gravity, UA 1.025 1.005 - 1.030   pH, UA 5.5 5.0 - 7.5   Color, UA Yellow Yellow   Appearance Ur Cloudy (A) Clear   Leukocytes, UA Trace (A) Negative   Protein, UA Trace (A) Negative/Trace   Glucose, UA Negative Negative   Ketones, UA Negative Negative   RBC, UA 3+ (A) Negative   Bilirubin, UA Negative Negative   Urobilinogen, Ur 0.2 0.2 - 1.0 mg/dL   Nitrite, UA Negative Negative   Microscopic Examination See below:     Pertinent Imaging:    Study Result     CLINICAL DATA: Acute left-sided flank pain since 10 o'clock this morning.  EXAM: CT ABDOMEN AND PELVIS WITHOUT CONTRAST  TECHNIQUE: Multidetector CT imaging of the abdomen and pelvis was performed following the standard protocol without IV contrast.  COMPARISON: 02/14/2014; 07/10/2008  FINDINGS: The lack of intravenous contrast limits the ability to evaluate solid abdominal organs.  There is a punctate (approximately 0.4 cm) stone within the superior aspect of the left ureter (representative axial image 44, series 2, coronal image 80, series 5) which results an very mild upstream ureterectasis, pelvicaliectasis and slightly asymmetric left-sided perinephric stranding.  Note is made of an additional punctate (approximately 3 mm) nonobstructing stone within the interpolar aspect of the left kidney (image 34, series 2).  No definite right-sided renal stones. No stones are seen along the expected course of the right ureter. Note is made of a right-sided extrarenal pelvis. No evidence of right-sided urinary obstruction.  _____________________________________________________________  Normal hepatic contour. Approximately 4 cm hypo attenuating lesion within the dome of the left lobe of the liver (image 11, series 2) as well as the approximately 1.2 cm hypo attenuating lesion within the dome of the right lobe of the liver (image 12) and  an approximately 0.8 cm hypoattenuating lesion also within the right lobe of the liver (image 15, series 2) are incompletely characterized without intravenous contrast though appear morphologically unchanged since 01/2014 examination and thus favored to represent hepatic cysts. Post cholecystectomy. No ascites.  Normal noncontrast appearance of the bilateral adrenal glands, pancreas and spleen.  Scattered colonic diverticulosis without evidence of diverticulitis on this noncontrast examination. Normal noncontrast appearance of the terminal ileum and appendix. No pneumoperitoneum, pneumatosis or portal venous gas.  Scattered atherosclerotic plaque within a normal caliber abdominal aorta. Post prostatectomy. Normal noncontrast appearance of the urinary bladder. No free fluid in the pelvic cul-de-sac.  Limited visualization of lower thorax demonstrates minimal dependent subpleural ground-glass atelectasis, right greater than left. No focal airspace opacities. No pleural effusion.  Normal heart size. No pericardial effusion.  Mild (< 25%) compression deformity involving the superior endplate of the [id number] vertebral body is grossly unchanged since the 01/2014 examination. No acute or aggressive osseous abnormalities. Stigmata of DISH within the caudal aspect of the thoracic spine.  Small (approximately 2.0 x 2.5 cm mesenteric fat containing periumbilical hernia appears grossly unchanged since the 01/2014 examination. Regional soft tissues appear otherwise normal.  IMPRESSION: 1. Punctate (approximately 4 mm) stone within the superior aspect of the left ureter results in very mild upstream ureterectasis and pelvicaliectasis. 2. Additional punctate (approximately 3 mm) nonobstructing left-sided renal stone. 3. No evidence of right-sided nephrolithiasis urinary obstruction. 4. Colonic diverticulosis without evidence of diverticulitis.   Electronically Signed  By: Sandi Mariscal M.D.  On: 02/22/2016 13:08     Assessment & Plan:    1. Left ureteral stone Discussed alternatives including left ESWL versus left ureteroscopy. Given the presence of a small nonobstructing stone in addition to his ureteral calculus, recommend ureteroscopy to treat both at once. We discussed the risks and benefits of both including bleeding, infection, damage to surrounding structures, efficacy with need for possible further intervention, and need for temporary ureteral stent. - Urinalysis, Complete - Urine culture  2. History of prostate cancer PSA remains stable at 0.01 as of October 2016. Continue to follow annually.  3. SUI (stress urinary incontinence), male Mild, minimal bother.  Schedule L URS, LL, stent exchange.    Hollice Espy, MD  [place] [address] [place], [place] [address] [phone]  I spent 30 min with this patient of which greater than 50% was spent in counseling and coordination of care with the patient.   Imaging was reviewed personally with the patient and all of his questions were answered in detail.  His wife was also present today at our discussion.

## 2016-03-07 LAB — URINALYSIS, COMPLETE
Bilirubin, UA: NEGATIVE
GLUCOSE, UA: NEGATIVE
Ketones, UA: NEGATIVE
Nitrite, UA: NEGATIVE
Specific Gravity, UA: 1.025 (ref 1.005–1.030)
UUROB: 0.2 mg/dL (ref 0.2–1.0)
pH, UA: 5.5 (ref 5.0–7.5)

## 2016-03-07 LAB — MICROSCOPIC EXAMINATION: BACTERIA UA: NONE SEEN

## 2016-03-08 LAB — URINE CULTURE: Organism ID, Bacteria: NO GROWTH

## 2016-03-09 ENCOUNTER — Encounter
Admission: RE | Admit: 2016-03-09 | Discharge: 2016-03-09 | Disposition: A | Discharge status: Home / Self Care | Payer: Commercial Managed Care - HMO | Source: Ambulatory Visit | Attending: Urology | Admitting: Urology

## 2016-03-09 DIAGNOSIS — I10 Essential (primary) hypertension: Secondary | ICD-10-CM | POA: Diagnosis not present

## 2016-03-09 DIAGNOSIS — Z01812 Encounter for preprocedural laboratory examination: Secondary | ICD-10-CM | POA: Insufficient documentation

## 2016-03-09 DIAGNOSIS — Z01810 Encounter for preprocedural cardiovascular examination: Secondary | ICD-10-CM | POA: Diagnosis not present

## 2016-03-09 DIAGNOSIS — N200 Calculus of kidney: Secondary | ICD-10-CM | POA: Diagnosis not present

## 2016-03-09 HISTORY — DX: Dizziness and giddiness: R42

## 2016-03-09 HISTORY — DX: Malignant (primary) neoplasm, unspecified: C80.1

## 2016-03-09 LAB — CBC
HCT: 43.4 % (ref 40.0–52.0)
HEMOGLOBIN: 14.8 g/dL (ref 13.0–18.0)
MCH: 30 pg (ref 26.0–34.0)
MCHC: 34.2 g/dL (ref 32.0–36.0)
MCV: 87.7 fL (ref 80.0–100.0)
Platelets: 277 10*3/uL (ref 150–440)
RBC: 4.95 MIL/uL (ref 4.40–5.90)
RDW: 14.4 % (ref 11.5–14.5)
WBC: 10.3 10*3/uL (ref 3.8–10.6)

## 2016-03-09 NOTE — Patient Instructions (Signed)
  Your procedure is scheduled [id number] 27, 2017 (Monday) Report to Day Surgery.[facility]) Second Floor To find out your arrival time please call [phone] between 1PM - 3PM on March 13, 2016 (Friday).  Remember: Instructions that are not followed completely may result in serious medical risk, up to and including death, or upon the discretion of your surgeon and anesthesiologist your surgery may need to be rescheduled.    __x__ 1. Do not eat food or drink liquids after midnight. No gum chewing or hard candies.     ____ 2. No Alcohol for 24 hours before or after surgery.   ____ 3. Bring all medications with you on the day of surgery if instructed.    __x__ 4. Notify your doctor if there is any change in your medical condition     (cold, fever, infections).     Do not wear jewelry, make-up, hairpins, clips or nail polish.  Do not wear lotions, powders, or perfumes. You may wear deodorant.  Do not shave 48 hours prior to surgery. Men may shave face and neck.  Do not bring valuables to the hospital.    [facility] is not responsible for any belongings or valuables.               Contacts, dentures or bridgework may not be worn into surgery.  Leave your suitcase in the car. After surgery it may be brought to your room.  For patients admitted to the hospital, discharge time is determined by your                treatment team.   Patients discharged the day of surgery will not be allowed to drive home.   Please read over the following fact sheets that you were given:   Surgical Site Infection Prevention   ____ Take these medicines the morning of surgery with A SIP OF WATER:    1. Atenolol  2.   3.   4.  5.  6.  ____ Fleet Enema (as directed)   ____ Use CHG Soap as directed  ____ Use inhalers on the day of surgery  ____ Stop metformin 2 days prior to surgery    ____ Take 1/2 of usual insulin dose the night before surgery and none on the morning of surgery.   __x__ Stop  Coumadin/Plavix/aspirin on (N/A)  __x__ Stop Anti-inflammatories on (NO NSAIDS) TYLENOL OK TO TAKE FOR PAIN IF NEEDED   ____ Stop supplements until after surgery.    ____ Bring C-Pap to the hospital.

## 2016-03-16 ENCOUNTER — Encounter
Admission: RE | Disposition: A | Discharge status: Home / Self Care | Payer: Self-pay | Source: Ambulatory Visit | Attending: Urology

## 2016-03-16 ENCOUNTER — Encounter: Payer: Self-pay | Admitting: *Deleted

## 2016-03-16 ENCOUNTER — Ambulatory Visit: Payer: Commercial Managed Care - HMO | Admitting: Anesthesiology

## 2016-03-16 ENCOUNTER — Ambulatory Visit
Admission: RE | Admit: 2016-03-16 | Discharge: 2016-03-16 | Disposition: A | Discharge status: Home / Self Care | Payer: Commercial Managed Care - HMO | Source: Ambulatory Visit | Attending: Urology | Admitting: Urology

## 2016-03-16 DIAGNOSIS — K5730 Diverticulosis of large intestine without perforation or abscess without bleeding: Secondary | ICD-10-CM | POA: Diagnosis not present

## 2016-03-16 DIAGNOSIS — N202 Calculus of kidney with calculus of ureter: Secondary | ICD-10-CM | POA: Diagnosis not present

## 2016-03-16 DIAGNOSIS — R109 Unspecified abdominal pain: Secondary | ICD-10-CM | POA: Diagnosis not present

## 2016-03-16 DIAGNOSIS — Z87440 Personal history of urinary (tract) infections: Secondary | ICD-10-CM | POA: Diagnosis not present

## 2016-03-16 DIAGNOSIS — Z9049 Acquired absence of other specified parts of digestive tract: Secondary | ICD-10-CM | POA: Diagnosis not present

## 2016-03-16 DIAGNOSIS — I10 Essential (primary) hypertension: Secondary | ICD-10-CM | POA: Diagnosis not present

## 2016-03-16 DIAGNOSIS — Z91041 Radiographic dye allergy status: Secondary | ICD-10-CM | POA: Diagnosis not present

## 2016-03-16 DIAGNOSIS — N390 Urinary tract infection, site not specified: Secondary | ICD-10-CM | POA: Diagnosis not present

## 2016-03-16 DIAGNOSIS — B964 Proteus (mirabilis) (morganii) as the cause of diseases classified elsewhere: Secondary | ICD-10-CM | POA: Insufficient documentation

## 2016-03-16 DIAGNOSIS — Z9079 Acquired absence of other genital organ(s): Secondary | ICD-10-CM | POA: Diagnosis not present

## 2016-03-16 DIAGNOSIS — D72829 Elevated white blood cell count, unspecified: Secondary | ICD-10-CM | POA: Insufficient documentation

## 2016-03-16 DIAGNOSIS — N201 Calculus of ureter: Secondary | ICD-10-CM

## 2016-03-16 DIAGNOSIS — Z87442 Personal history of urinary calculi: Secondary | ICD-10-CM | POA: Insufficient documentation

## 2016-03-16 DIAGNOSIS — Z888 Allergy status to other drugs, medicaments and biological substances status: Secondary | ICD-10-CM | POA: Diagnosis not present

## 2016-03-16 DIAGNOSIS — N200 Calculus of kidney: Secondary | ICD-10-CM | POA: Diagnosis not present

## 2016-03-16 DIAGNOSIS — Z79899 Other long term (current) drug therapy: Secondary | ICD-10-CM | POA: Diagnosis not present

## 2016-03-16 DIAGNOSIS — N393 Stress incontinence (female) (male): Secondary | ICD-10-CM | POA: Insufficient documentation

## 2016-03-16 DIAGNOSIS — Z8546 Personal history of malignant neoplasm of prostate: Secondary | ICD-10-CM | POA: Diagnosis not present

## 2016-03-16 HISTORY — PX: CYSTOSCOPY WITH RETROGRADE URETHROGRAM: SHX6309

## 2016-03-16 HISTORY — PX: CYSTOSCOPY WITH STENT PLACEMENT: SHX5790

## 2016-03-16 HISTORY — PX: STONE EXTRACTION WITH BASKET: SHX5318

## 2016-03-16 SURGERY — CYSTOSCOPY, WITH STENT INSERTION
Anesthesia: General

## 2016-03-16 MED ORDER — EPHEDRINE SULFATE 50 MG/ML IJ SOLN
INTRAMUSCULAR | Status: DC | PRN
  Administered 2016-03-16: 10 mg via INTRAVENOUS

## 2016-03-16 MED ORDER — SUGAMMADEX SODIUM 200 MG/2ML IV SOLN
INTRAVENOUS | Status: DC | PRN
  Administered 2016-03-16: 180 mg via INTRAVENOUS

## 2016-03-16 MED ORDER — FENTANYL CITRATE (PF) 100 MCG/2ML IJ SOLN: 25.0000 ug | INTRAMUSCULAR | Status: DC | PRN

## 2016-03-16 MED ORDER — SODIUM CHLORIDE 0.9 % IV SOLN
1.0000 g | Freq: Once | INTRAVENOUS | Status: AC
  Administered 2016-03-16: 1 g via INTRAVENOUS

## 2016-03-16 MED ORDER — ONDANSETRON HCL 4 MG/2ML IJ SOLN
4.0000 mg | Freq: Once | INTRAMUSCULAR | Status: DC | PRN
Start: 2016-03-16 — End: 2016-03-16

## 2016-03-16 MED ORDER — ROCURONIUM BROMIDE 100 MG/10ML IV SOLN
INTRAVENOUS | Status: DC | PRN
  Administered 2016-03-16: 40 mg via INTRAVENOUS

## 2016-03-16 MED ORDER — SODIUM CHLORIDE 0.9 % IV SOLN
INTRAVENOUS | Status: AC
  Administered 2016-03-16: 1 g via INTRAVENOUS
  Filled 2016-03-16: qty 1000

## 2016-03-16 MED ORDER — FAMOTIDINE 20 MG PO TABS
ORAL_TABLET | ORAL | Status: AC
  Administered 2016-03-16: 20 mg via ORAL
  Filled 2016-03-16: qty 1

## 2016-03-16 MED ORDER — FAMOTIDINE 20 MG PO TABS
20.0000 mg | ORAL_TABLET | Freq: Once | ORAL | Status: AC
  Administered 2016-03-16: 20 mg via ORAL

## 2016-03-16 MED ORDER — PROPOFOL 10 MG/ML IV BOLUS
INTRAVENOUS | Status: DC | PRN
  Administered 2016-03-16: 130 mg via INTRAVENOUS
  Administered 2016-03-16: 50 mg via INTRAVENOUS

## 2016-03-16 MED ORDER — OXYCODONE HCL 5 MG PO TABS: 5.0000 mg | ORAL_TABLET | ORAL | Status: DC | PRN

## 2016-03-16 MED ORDER — LIDOCAINE HCL (CARDIAC) 20 MG/ML IV SOLN
INTRAVENOUS | Status: DC | PRN
  Administered 2016-03-16: 100 mg via INTRAVENOUS

## 2016-03-16 MED ORDER — DEXAMETHASONE SODIUM PHOSPHATE 10 MG/ML IJ SOLN
INTRAMUSCULAR | Status: DC | PRN
  Administered 2016-03-16: 10 mg via INTRAVENOUS

## 2016-03-16 MED ORDER — LACTATED RINGERS IV SOLN
INTRAVENOUS | Status: DC
  Administered 2016-03-16: 12:00:00 via INTRAVENOUS

## 2016-03-16 MED ORDER — IOTHALAMATE MEGLUMINE 43 % IV SOLN
INTRAVENOUS | Status: DC | PRN
  Administered 2016-03-16: 15 mL

## 2016-03-16 MED ORDER — ONDANSETRON HCL 4 MG/2ML IJ SOLN
INTRAMUSCULAR | Status: DC | PRN
  Administered 2016-03-16: 4 mg via INTRAVENOUS

## 2016-03-16 MED ORDER — GENTAMICIN IN SALINE 1.6-0.9 MG/ML-% IV SOLN
80.0000 mg | Freq: Once | INTRAVENOUS | Status: AC
  Administered 2016-03-16: 80 mg via INTRAVENOUS
  Filled 2016-03-16: qty 50

## 2016-03-16 SURGICAL SUPPLY — 32 items
ADAPTER SCOPE UROLOK II (MISCELLANEOUS) IMPLANT
BAG DRAIN CYSTO-URO LG1000N (MISCELLANEOUS) ×5 IMPLANT
BASKET ZERO TIP 1.9FR (BASKET) ×5 IMPLANT
CATH FOL 2WAY LX 16X5 (CATHETERS) ×5 IMPLANT
CATH URETL 5X70 OPEN END (CATHETERS) ×5 IMPLANT
CNTNR SPEC 2.5X3XGRAD LEK (MISCELLANEOUS) ×3
CONRAY 43 FOR UROLOGY 50M (MISCELLANEOUS) ×5 IMPLANT
CONT SPEC 4OZ STER OR WHT (MISCELLANEOUS) ×2
CONTAINER SPEC 2.5X3XGRAD LEK (MISCELLANEOUS) ×3 IMPLANT
DRAPE UTILITY 15X26 TOWEL STRL (DRAPES) ×5 IMPLANT
GLOVE BIO SURGEON STRL SZ 6.5 (GLOVE) ×4 IMPLANT
GLOVE BIO SURGEONS STRL SZ 6.5 (GLOVE) ×1
GOWN STRL REUS W/ TWL LRG LVL4 (GOWN DISPOSABLE) ×6 IMPLANT
GOWN STRL REUS W/TWL LRG LVL4 (GOWN DISPOSABLE) ×4
HOLDER FOLEY CATH W/STRAP (MISCELLANEOUS) ×5 IMPLANT
INTRODUCER DILATOR DOUBLE (INTRODUCER) ×5 IMPLANT
KIT RM TURNOVER CYSTO AR (KITS) ×5 IMPLANT
LASER FIBER 200M SMARTSCOPE (Laser) IMPLANT
PACK CYSTO AR (MISCELLANEOUS) ×5 IMPLANT
PREP PVP WINGED SPONGE (MISCELLANEOUS) IMPLANT
PUMP SINGLE ACTION SAP (PUMP) IMPLANT
SENSORWIRE 0.038 NOT ANGLED (WIRE) ×15
SET CYSTO W/LG BORE CLAMP LF (SET/KITS/TRAYS/PACK) ×5 IMPLANT
SHEATH URETERAL 12FRX35CM (MISCELLANEOUS) IMPLANT
SOL .9 NS 3000ML IRR  AL (IV SOLUTION) ×2
SOL .9 NS 3000ML IRR UROMATIC (IV SOLUTION) ×3 IMPLANT
STENT URET 6FRX24 CONTOUR (STENTS) IMPLANT
STENT URET 6FRX26 CONTOUR (STENTS) ×5 IMPLANT
SURGILUBE 2OZ TUBE FLIPTOP (MISCELLANEOUS) ×5 IMPLANT
SYRINGE IRR TOOMEY STRL 70CC (SYRINGE) ×5 IMPLANT
WATER STERILE IRR 1000ML POUR (IV SOLUTION) ×5 IMPLANT
WIRE SENSOR 0.038 NOT ANGLED (WIRE) ×9 IMPLANT

## 2016-03-16 NOTE — Anesthesia Procedure Notes (Signed)
Procedure Name: Intubation Date/Time: 03/16/2016 1:12 PM Performed by: Alda Berthold Pre-anesthesia Checklist: Patient identified, Patient being monitored, Timeout performed, Emergency Drugs available and Suction available Patient Re-evaluated:Patient Re-evaluated prior to inductionOxygen Delivery Method: Circle system utilized Preoxygenation: Pre-oxygenation with 100% oxygen Intubation Type: IV induction Ventilation: Mask ventilation without difficulty Laryngoscope Size: Mac, 4 and McGraph Grade View: Grade III Tube type: Oral Tube size: 6.5 mm Number of attempts: 1 Placement Confirmation: ETT inserted through vocal cords under direct vision,  positive ETCO2 and breath sounds checked- equal and bilateral Secured at: 22 cm Tube secured with: Tape Dental Injury: Teeth and Oropharynx as per pre-operative assessment  Difficulty Due To: Difficult Airway- due to anterior larynx and Difficulty was unanticipated Comments: DVL x2 with Mac 4 blade, Able to only visualize epiglottis. DVL x1 with McGrath size 4 and passed 6.5 ETT with relative ease.

## 2016-03-16 NOTE — Discharge Instructions (Signed)
You have a ureteral stent in place.  This is a tube that extends from your kidney to your bladder.  This may cause urinary bleeding, burning with urination, and urinary frequency.  Please call our office or present to the ED if you develop fevers >101 or pain which is not able to be controlled with oral pain medications.  You may be given either Flomax and/ or ditropan to help with bladder spasms and stent pain in addition to pain medications.   ° °[facility] °[address], [address] °[place], [place] [address] °[phone] ° ° ° °AMBULATORY SURGERY  °DISCHARGE INSTRUCTIONS ° ° °1) The drugs that you were given will stay in your system until tomorrow so for the next 24 hours you should not: ° °A) Drive an automobile °B) Make any legal decisions °C) Drink any alcoholic beverage ° ° °2) You may resume regular meals tomorrow.  Today it is better to start with liquids and gradually work up to solid foods. ° °You may eat anything you prefer, but it is better to start with liquids, then soup and crackers, and gradually work up to solid foods. ° ° °3) Please notify your doctor immediately if you have any unusual bleeding, trouble breathing, redness and pain at the surgery site, drainage, fever, or pain not relieved by medication. ° ° ° °4) Additional Instructions: ° ° ° ° ° ° ° °Please contact your physician with any problems or Same Day Surgery at [phone], Monday through Friday 6 am to 4 pm, or [place] [facility] [place] Main number at [phone]. °

## 2016-03-16 NOTE — Op Note (Signed)
Date of procedure: 03/16/2016  Preoperative diagnosis:  1. Left ureteral stone 2. Left kidney stone 3. History of UTI/ pyelonephritis   Postoperative diagnosis:  1. Left kidney stones 2. History of UTI/ pyelonephritis  Procedure: 1. Cystoscopy 2. Left ureteroscopy 3. Left basket extraction of stone 4. Left ureteral stent exchange 5. Left retrograde pyelogram  Surgeon: Hollice Espy, MD  Anesthesia: General  Complications: None  Intraoperative findings: No ureteral stone identified.  The left upper pole stone extracted via basket.  EBL: Minimal  Specimens: Kidney stone  Drains: 6 x 26 French double-J ureteral stent on left  Indication: Gregory [name] is a 75 y.o. patient with .  After reviewing the management options for treatment, he elected to proceed with the above surgical procedure(s). We have discussed the potential benefits and risks of the procedure, side effects of the proposed treatment, the likelihood of the patient achieving the goals of the procedure, and any potential problems that might occur during the procedure or recuperation. Informed consent has been obtained.  Description of procedure:  The patient was taken to the operating room and general anesthesia was induced.  The patient was placed in the dorsal lithotomy position, prepped and draped in the usual sterile fashion, and preoperative antibiotics were administered. A preoperative time-out was performed.   At this point in time, a rigid 21 [place] scope was advanced per urethra into the bladder. Attention was turned to the left ureteral orifice from which a ureteral stent was seen emanating. The distal coil of the stent was grasped and brought to level of the urethral meatus. The stent was then cannulated using a sensor wire up to level of the kidney and the stent was removed leaving the wire place. The wire was snapped in place as a safety wire. A semirigid ureteroscope was then advanced alongside the  wire up to the level of the iliacs.  No stones were identified in the ureter was normal. A second wire was introduced through the scope up to level of the kidney and the scope was removed.  Of note, no obvious ureteral catheter indications were noted. Next, a flexible ureteroscope was advanced over the working wire up to the level of the kidney without difficulty. The wire was removed and at this point time, a formal pyeloscopy was performed. Each and every calyx was strictly visualized. A small approximately 3 mm stone was identified within the upper pole. This was seemingly adherent to the overlying mucosa and dislodged using the tip of the basket. Given its small size and spherical nature, the stone did not require fragmentation. The stone was grasped using 1.9 [place] to plus nitinol basket and the scope and stone was able to be extracted bringing down the length of the ureter without difficulty. The stone was then passed off the field as stone specimen. A dual lumen catheter was then used just within the distal ureter under fluoroscopic guidance to introduce a second wire again up to level of the kidney. The flexible ureteroscope was then advanced again easily up to level of the kidney. A second formal pyeloscopy was performed and there are no residual stones identified. There were no stones within the renal pelvis either. The scope was then backed to level of the UPJ and a retrograde pyelogram was performed. This revealed a full left collecting system presumably from the irrigation from ureteroscopy but no obvious filling defects. The roadmap which was created confirmed that each never calyx had been directly visualized. Finally, the scope was  backed down the length of the ureter. Within the proximal ureter, there was one small flap of mucosa and I traversed this area many times carefully inspecting the area to ensure that the stone was not embedded at this location. Finally, I was satisfied that there was no  residual stone fragment at this location. The scope was then backed down the entire length of the ureter inspecting along the way. There is no ureteral trauma other than that small mucosal flap within the proximal ureter and no additional stone fragments identified. Finally, the safety wire was backloaded over a rigid cystoscope and a 6 x 26 French double-J ureteral stent was advanced over the wire up to the level of the renal pelvis. The wire was partially withdrawn and the proximal coil hooked over the upper pole calyx. The wire was then fully withdrawn and a full coil was noted within the bladder. The string was not left in place. The bladder was drained and the scope was removed. The patient was cleaned and dried, repositioned the supine position, and taken to the PACU in stable condition  Plan: Patient will return to the office in 1 week for cystoscopy, stent removal.   Hollice Espy, M.D.

## 2016-03-16 NOTE — Interval H&P Note (Signed)
History and Physical Interval Note:  03/16/2016 12:46 PM  Gregory [name]  has presented today for surgery, with the diagnosis of LEFT URETERAL STONE  The various methods of treatment have been discussed with the patient and family. After consideration of risks, benefits and other options for treatment, the patient has consented to  Procedure(s): URETEROSCOPY WITH HOLMIUM LASER LITHOTRIPSY (N/A) CYSTOSCOPY WITH STENT PLACEMENT/EXCHANGE (Left) as a surgical intervention .  The patient's history has been reviewed, patient examined, no change in status, stable for surgery.  I have reviewed the patient's chart and labs.  Questions were answered to the patient's satisfaction.     Hollice Espy

## 2016-03-16 NOTE — H&P (View-Only) (Signed)
03/06/2016 4:01 PM   Gregory [name] Apr 30, 1941 [id number]  Referring provider: Tracie Harrier, MD [address] [facility] [place], [place] [address]  Chief Complaint  Patient presents with  . Pre-op Exam  . Hospitalization Follow-up    HPI:  75 year old male recently discharged from the hospital following left ureteral stent placement for an obstructing ureteral stone. He was initially admitted for medical expulsive therapy, however, his white count continued to rise and therefore ultimately underwent stent placement in the setting of urinary tract infection. He has since completed his course of antibiotics and is feeling much better. He returns to the office today to discuss definitive management of his stone.  He also has multiple other urologic issues addressed today as below.  Left ureteral stone  4 mm left midureteral stone, leukocytosis, positive urine culture growing Proteus sensitive to most antibiotics except nitrofurantoin. S/p ureteral stent on 02/23/16.   3 mm L nonobstructing stone, no stones on right. 6 other previous remote stone episodes which has not required surgical intervention. His last episode was approximately 20 years ago.  History of benign bladder growth  TURBT in 2012, PUNLMP Cystoscopy annually, last 11 months ago  History of prostate cancer S/p open radical prostatectomy 2013 PSA 09/2015 0.01, stable  SUI Rare leakage  PMH: Past Medical History  Diagnosis Date  . Kidney stones   . Hypertension     Surgical History: Past Surgical History  Procedure Laterality Date  . Cystoscopy with stent placement Left 02/23/2016    Procedure: CYSTOSCOPY WITH STENT PLACEMENT;  Surgeon: Dereck Leep, MD;  Location: [facility] ORS;  Service: Urology;  Laterality: Left;  . Cholecystectomy    . Prostatectomy    . Melanoma excision      Home Medications:    Medication List       This list is accurate as of: 03/06/16  4:01 PM.   Always use your most recent med list.               atenolol 25 MG tablet  Commonly known as:  TENORMIN  Take 25 mg by mouth daily.     docusate sodium 100 MG capsule  Commonly known as:  COLACE  Take 1 capsule (100 mg total) by mouth 2 (two) times daily.     fluorometholone 0.1 % ophthalmic suspension  Commonly known as:  FML  Place 1 drop into both eyes daily as needed. For dry/itchy eyes.     losartan-hydrochlorothiazide 100-25 MG tablet  Commonly known as:  HYZAAR  Take 1 tablet by mouth every morning.     oxyCODONE 5 MG immediate release tablet  Commonly known as:  Oxy IR/ROXICODONE  Take 1-2 tablets (5-10 mg total) by mouth every 4 (four) hours as needed (1 tablet for pain 4-7/10, 2 tablets for pain 8-10/10).     tamsulosin 0.4 MG Caps capsule  Commonly known as:  FLOMAX  Take 1 capsule (0.4 mg total) by mouth daily.     TOVIAZ 8 MG Tb24 tablet  Generic drug:  fesoterodine  TAKE ONE TABLET BY MOUTH EVERY DAY        Allergies:  Allergies  Allergen Reactions  . Iodine Anaphylaxis  . Other     Other reaction(s): Other (See Comments) Uncoded Allergy. Allergen: SHELLFISH, Other Reaction: FACIAL/THROAT SWELLING    Family History: History reviewed. No pertinent family history.  Social History:  reports that he has never smoked. He has never used smokeless tobacco. He reports that  he does not drink alcohol or use illicit drugs.  ROS: UROLOGY Frequent Urination?: Yes Hard to postpone urination?: Yes Burning/pain with urination?: No Get up at night to urinate?: Yes Leakage of urine?: No Urine stream starts and stops?: No Trouble starting stream?: No Do you have to strain to urinate?: No Blood in urine?: No Urinary tract infection?: No Sexually transmitted disease?: No Injury to kidneys or bladder?: No Painful intercourse?: No Weak stream?: No Erection problems?: No Penile pain?: No  Gastrointestinal Nausea?: No Vomiting?: No Indigestion/heartburn?:  No Diarrhea?: No Constipation?: No  Constitutional Fever: No Night sweats?: No Weight loss?: No Fatigue?: No  Skin Skin rash/lesions?: No Itching?: No  Eyes Blurred vision?: No Double vision?: No  Ears/Nose/Throat Sore throat?: No Sinus problems?: No  Hematologic/Lymphatic Swollen glands?: No Easy bruising?: No  Cardiovascular Leg swelling?: No Chest pain?: No  Respiratory Cough?: No Shortness of breath?: No  Endocrine Excessive thirst?: No  Musculoskeletal Back pain?: No Joint pain?: No  Neurological Headaches?: No Dizziness?: No  Psychologic Depression?: No Anxiety?: No  Physical Exam: BP 155/87 mmHg  Pulse 76  Temp(Src) 97.7 F (36.5 C) (Oral)  Resp 16  Ht 5\' 8"  (1.727 m)  Wt 201 lb (91.173 kg)  BMI 30.57 kg/m2  SpO2 98%  Constitutional:  Alert and oriented, No acute distress. HEENT: [place] AT, moist mucus membranes.  Trachea midline, no masses. Cardiovascular: No clubbing, cyanosis, or edema. RRR. Respiratory: Normal respiratory effort, no increased work of breathing.  CTAB. GI: Abdomen is soft, nontender, nondistended, no abdominal masses GU: No CVA tenderness.  Skin: No rashes, bruises or suspicious lesions. Neurologic: Grossly intact, no focal deficits, moving all 4 extremities. Psychiatric: Normal mood and affect.  Laboratory Data: Lab Results  Component Value Date   WBC 13.9* 02/25/2016   HGB 13.2 02/25/2016   HCT 39.0* 02/25/2016   MCV 89.4 02/25/2016   PLT 140* 02/25/2016    Lab Results  Component Value Date   CREATININE 1.84* 02/24/2016    Urinalysis Results for orders placed or performed in visit on 03/06/16  Urine culture  Result Value Ref Range   Urine Culture, Routine Final report    Urine Culture result 1 No growth   Microscopic Examination  Result Value Ref Range   WBC, UA 0-5 0 -  5 /hpf   RBC, UA >30 (A) 0 -  2 /hpf   Epithelial Cells (non renal) 0-10 0 - 10 /hpf   Bacteria, UA None seen None seen/Few    Urinalysis, Complete  Result Value Ref Range   Specific Gravity, UA 1.025 1.005 - 1.030   pH, UA 5.5 5.0 - 7.5   Color, UA Yellow Yellow   Appearance Ur Cloudy (A) Clear   Leukocytes, UA Trace (A) Negative   Protein, UA Trace (A) Negative/Trace   Glucose, UA Negative Negative   Ketones, UA Negative Negative   RBC, UA 3+ (A) Negative   Bilirubin, UA Negative Negative   Urobilinogen, Ur 0.2 0.2 - 1.0 mg/dL   Nitrite, UA Negative Negative   Microscopic Examination See below:     Pertinent Imaging:    Study Result     CLINICAL DATA: Acute left-sided flank pain since 10 o'clock this morning.  EXAM: CT ABDOMEN AND PELVIS WITHOUT CONTRAST  TECHNIQUE: Multidetector CT imaging of the abdomen and pelvis was performed following the standard protocol without IV contrast.  COMPARISON: 02/14/2014; 07/10/2008  FINDINGS: The lack of intravenous contrast limits the ability to evaluate solid abdominal organs.  There is a punctate (approximately 0.4 cm) stone within the superior aspect of the left ureter (representative axial image 44, series 2, coronal image 80, series 5) which results an very mild upstream ureterectasis, pelvicaliectasis and slightly asymmetric left-sided perinephric stranding.  Note is made of an additional punctate (approximately 3 mm) nonobstructing stone within the interpolar aspect of the left kidney (image 34, series 2).  No definite right-sided renal stones. No stones are seen along the expected course of the right ureter. Note is made of a right-sided extrarenal pelvis. No evidence of right-sided urinary obstruction.  _____________________________________________________________  Normal hepatic contour. Approximately 4 cm hypo attenuating lesion within the dome of the left lobe of the liver (image 11, series 2) as well as the approximately 1.2 cm hypo attenuating lesion within the dome of the right lobe of the liver (image 12) and  an approximately 0.8 cm hypoattenuating lesion also within the right lobe of the liver (image 15, series 2) are incompletely characterized without intravenous contrast though appear morphologically unchanged since 01/2014 examination and thus favored to represent hepatic cysts. Post cholecystectomy. No ascites.  Normal noncontrast appearance of the bilateral adrenal glands, pancreas and spleen.  Scattered colonic diverticulosis without evidence of diverticulitis on this noncontrast examination. Normal noncontrast appearance of the terminal ileum and appendix. No pneumoperitoneum, pneumatosis or portal venous gas.  Scattered atherosclerotic plaque within a normal caliber abdominal aorta. Post prostatectomy. Normal noncontrast appearance of the urinary bladder. No free fluid in the pelvic cul-de-sac.  Limited visualization of lower thorax demonstrates minimal dependent subpleural ground-glass atelectasis, right greater than left. No focal airspace opacities. No pleural effusion.  Normal heart size. No pericardial effusion.  Mild (< 25%) compression deformity involving the superior endplate of the [id number] vertebral body is grossly unchanged since the 01/2014 examination. No acute or aggressive osseous abnormalities. Stigmata of DISH within the caudal aspect of the thoracic spine.  Small (approximately 2.0 x 2.5 cm mesenteric fat containing periumbilical hernia appears grossly unchanged since the 01/2014 examination. Regional soft tissues appear otherwise normal.  IMPRESSION: 1. Punctate (approximately 4 mm) stone within the superior aspect of the left ureter results in very mild upstream ureterectasis and pelvicaliectasis. 2. Additional punctate (approximately 3 mm) nonobstructing left-sided renal stone. 3. No evidence of right-sided nephrolithiasis urinary obstruction. 4. Colonic diverticulosis without evidence of diverticulitis.   Electronically Signed  By: Sandi Mariscal M.D.  On: 02/22/2016 13:08     Assessment & Plan:    1. Left ureteral stone Discussed alternatives including left ESWL versus left ureteroscopy. Given the presence of a small nonobstructing stone in addition to his ureteral calculus, recommend ureteroscopy to treat both at once. We discussed the risks and benefits of both including bleeding, infection, damage to surrounding structures, efficacy with need for possible further intervention, and need for temporary ureteral stent. - Urinalysis, Complete - Urine culture  2. History of prostate cancer PSA remains stable at 0.01 as of October 2016. Continue to follow annually.  3. SUI (stress urinary incontinence), male Mild, minimal bother.  Schedule L URS, LL, stent exchange.    Hollice Espy, MD  [facility] [address] [place], [place] [address] [phone]  I spent 30 min with this patient of which greater than 50% was spent in counseling and coordination of care with the patient.   Imaging was reviewed personally with the patient and all of his questions were answered in detail.  His wife was also present today at our discussion.

## 2016-03-16 NOTE — Transfer of Care (Signed)
Immediate Anesthesia Transfer of Care Note  Patient: Gregory [name]  Procedure(s) Performed: Procedure(s): CYSTOSCOPY WITH STENT PLACEMENT/EXCHANGE (Left) CYSTOSCOPY WITH RETROGRADE URETHROGRAM STONE EXTRACTION WITH BASKET  Patient Location: PACU  Anesthesia Type:General  Level of Consciousness: awake, alert , oriented and patient cooperative  Airway & Oxygen Therapy: Patient Spontanous Breathing and Patient connected to nasal cannula oxygen  Post-op Assessment: Report given to RN, Post -op Vital signs reviewed and stable and Patient moving all extremities  Post vital signs: Reviewed and stable  Last Vitals:  Filed Vitals:   03/16/16 1144 03/16/16 1408  BP: 159/97 139/78  Pulse: 76 71  Temp: 36.5 C 36.3 C  Resp: 16 12    Complications: No apparent anesthesia complications

## 2016-03-16 NOTE — Anesthesia Preprocedure Evaluation (Signed)
Anesthesia Evaluation  Patient identified by MRN, date of birth, ID band Patient awake    Reviewed: Allergy & Precautions, H&P , NPO status , Patient's Chart, lab work & pertinent test results, reviewed documented beta blocker date and time   History of Anesthesia Complications Negative for: history of anesthetic complications  Airway Mallampati: III  TM Distance: >3 FB Neck ROM: limited    Dental  (+) Poor Dentition, Chipped   Pulmonary neg pulmonary ROS, neg shortness of breath,    Pulmonary exam normal breath sounds clear to auscultation       Cardiovascular Exercise Tolerance: Good hypertension, Pt. on medications and Pt. 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 negative neurological ROS  negative psych ROS   GI/Hepatic negative GI ROS, Neg liver ROS, neg GERD  ,  Endo/Other  negative endocrine ROS  Renal/GU Renal diseasenegative Renal ROSstones Bladder dysfunction  Hx of bladder surgery negative genitourinary   Musculoskeletal negative musculoskeletal ROS (+)   Abdominal   Peds negative pediatric ROS (+)  Hematology negative hematology ROS (+)   Anesthesia Other Findings CA prostate  Reproductive/Obstetrics negative OB ROS                             Anesthesia Physical  Anesthesia Plan  ASA: III  Anesthesia Plan: General   Post-op Pain Management:    Induction: Intravenous  Airway Management Planned: Oral ETT and LMA  Additional Equipment:   Intra-op Plan:   Post-operative Plan: Extubation in OR  Informed Consent: I have reviewed the patients History and Physical, chart, labs and discussed the procedure including the risks, benefits and alternatives for the proposed anesthesia with the patient or authorized representative who has  indicated his/her understanding and acceptance.   Dental advisory given  Plan Discussed with: CRNA and Surgeon  Anesthesia Plan Comments:        Anesthesia Quick Evaluation

## 2016-03-17 ENCOUNTER — Encounter: Payer: Self-pay | Admitting: Urology

## 2016-03-17 ENCOUNTER — Telehealth: Payer: Self-pay

## 2016-03-17 NOTE — Telephone Encounter (Signed)
Sent message back via my chart

## 2016-03-17 NOTE — Anesthesia Postprocedure Evaluation (Signed)
Anesthesia Post Note  Patient: Gregory [name]  Procedure(s) Performed: Procedure(s) (LRB): CYSTOSCOPY WITH STENT PLACEMENT/EXCHANGE (Left) CYSTOSCOPY WITH RETROGRADE URETHROGRAM STONE EXTRACTION WITH BASKET  Patient location during evaluation: PACU Anesthesia Type: General Level of consciousness: awake and alert and oriented Pain management: pain level controlled Vital Signs Assessment: post-procedure vital signs reviewed and stable Respiratory status: spontaneous breathing Cardiovascular status: blood pressure returned to baseline Anesthetic complications: no    Last Vitals:  Filed Vitals:   03/16/16 1457 03/16/16 1512  BP: 149/87 153/82  Pulse: 69 72  Temp:  35.1 C  Resp: 12 14    Last Pain:  Filed Vitals:   03/17/16 0833  PainSc: 0-No pain                 Markavious Micco

## 2016-03-17 NOTE — Telephone Encounter (Signed)
No abx.  Hollice Espy, MD

## 2016-03-17 NOTE — Telephone Encounter (Signed)
You performed procedure on me yesterday (0000000) and I was uncertain if I should be taking any antibiotic(s) as none were prescribed.     Sent via [employer]

## 2016-03-23 DIAGNOSIS — H2513 Age-related nuclear cataract, bilateral: Secondary | ICD-10-CM | POA: Diagnosis not present

## 2016-03-23 LAB — STONE ANALYSIS
Ca Oxalate,Monohydr.: 97 %
Ca phos cry stone ql IR: 3 %
STONE WEIGHT KSTONE: 22 mg

## 2016-03-24 ENCOUNTER — Encounter: Payer: Self-pay | Admitting: Urology

## 2016-03-24 ENCOUNTER — Ambulatory Visit (INDEPENDENT_AMBULATORY_CARE_PROVIDER_SITE_OTHER): Payer: Commercial Managed Care - HMO | Admitting: Urology

## 2016-03-24 VITALS — BP 167/65 | HR 78 | Ht 68.0 in | Wt 200.0 lb

## 2016-03-24 DIAGNOSIS — I10 Essential (primary) hypertension: Secondary | ICD-10-CM | POA: Insufficient documentation

## 2016-03-24 DIAGNOSIS — D333 Benign neoplasm of cranial nerves: Secondary | ICD-10-CM | POA: Insufficient documentation

## 2016-03-24 DIAGNOSIS — Z87448 Personal history of other diseases of urinary system: Secondary | ICD-10-CM | POA: Diagnosis not present

## 2016-03-24 DIAGNOSIS — N201 Calculus of ureter: Secondary | ICD-10-CM

## 2016-03-24 DIAGNOSIS — Z86018 Personal history of other benign neoplasm: Secondary | ICD-10-CM

## 2016-03-24 DIAGNOSIS — Z8546 Personal history of malignant neoplasm of prostate: Secondary | ICD-10-CM | POA: Diagnosis not present

## 2016-03-24 DIAGNOSIS — T7840XA Allergy, unspecified, initial encounter: Secondary | ICD-10-CM | POA: Insufficient documentation

## 2016-03-24 MED ORDER — CIPROFLOXACIN HCL 500 MG PO TABS
500.0000 mg | ORAL_TABLET | Freq: Once | ORAL | Status: AC
  Administered 2016-03-24: 500 mg via ORAL

## 2016-03-24 MED ORDER — LIDOCAINE HCL 2 % EX GEL
1.0000 "application " | Freq: Once | CUTANEOUS | Status: AC
  Administered 2016-03-24: 1 via URETHRAL

## 2016-03-24 NOTE — Progress Notes (Signed)
   03/24/2016  HPI: 75 year old male who underwent left ureteroscopy and basket extraction of stone on 03/16/2016. He returns today for cystoscopy, stent removal.   Cystoscopy/ Stent removal procedure  Patient identification was confirmed, informed consent was obtained, and patient was prepped using Betadine solution.  Lidocaine jelly was administered per urethral meatus.    Preoperative abx where received prior to procedure.    Procedure: - Flexible cystoscope introduced, without any difficulty.   - Thorough search of the bladder revealed:    normal urethral meatus  Stent seen emanating from left ureteral orifice, grasped with stent graspers, and removed in entirety.     Post-Procedure: - Patient tolerated the procedure well  Plan:  1. Left kidney stones S/p URS, stent removal F/u in 4 weeks with RUS prior  2. History of benign bladder growth  TURBT in 2012, PUNLMP Cystoscopy annually, last in OR on 02/2016, NED Due for cysto 02/2017  3. History of prostate cancer S/p open radical prostatectomy 2013 PSA 09/2015 0.01, stable Repeat in 6 months

## 2016-03-25 LAB — MICROSCOPIC EXAMINATION
BACTERIA UA: NONE SEEN
RBC, UA: >30 /hpf — AB (ref 0–?)
WBC, UA: NONE SEEN /hpf (ref 0–?)

## 2016-03-25 LAB — URINALYSIS, COMPLETE
Bilirubin, UA: NEGATIVE
Glucose, UA: NEGATIVE
Ketones, UA: NEGATIVE
LEUKOCYTES UA: NEGATIVE
Nitrite, UA: NEGATIVE
PH UA: 5 (ref 5.0–7.5)
Protein, UA: NEGATIVE
SPEC GRAV UA: 1.025 (ref 1.005–1.030)
Urobilinogen, Ur: 0.2 mg/dL (ref 0.2–1.0)

## 2016-03-26 DIAGNOSIS — J069 Acute upper respiratory infection, unspecified: Secondary | ICD-10-CM | POA: Diagnosis not present

## 2016-04-13 DIAGNOSIS — J342 Deviated nasal septum: Secondary | ICD-10-CM | POA: Diagnosis not present

## 2016-04-13 DIAGNOSIS — T7840XA Allergy, unspecified, initial encounter: Secondary | ICD-10-CM | POA: Diagnosis not present

## 2016-04-13 DIAGNOSIS — C61 Malignant neoplasm of prostate: Secondary | ICD-10-CM | POA: Diagnosis not present

## 2016-04-13 DIAGNOSIS — M1612 Unilateral primary osteoarthritis, left hip: Secondary | ICD-10-CM | POA: Diagnosis not present

## 2016-04-13 DIAGNOSIS — I10 Essential (primary) hypertension: Secondary | ICD-10-CM | POA: Diagnosis not present

## 2016-04-15 ENCOUNTER — Ambulatory Visit
Admission: RE | Admit: 2016-04-15 | Discharge: 2016-04-15 | Disposition: A | Discharge status: Home / Self Care | Payer: Commercial Managed Care - HMO | Source: Ambulatory Visit | Attending: Urology | Admitting: Urology

## 2016-04-15 DIAGNOSIS — N201 Calculus of ureter: Secondary | ICD-10-CM | POA: Diagnosis not present

## 2016-04-20 DIAGNOSIS — N200 Calculus of kidney: Secondary | ICD-10-CM | POA: Insufficient documentation

## 2016-04-20 DIAGNOSIS — I10 Essential (primary) hypertension: Secondary | ICD-10-CM | POA: Diagnosis not present

## 2016-04-20 DIAGNOSIS — J309 Allergic rhinitis, unspecified: Secondary | ICD-10-CM | POA: Diagnosis not present

## 2016-04-20 DIAGNOSIS — C61 Malignant neoplasm of prostate: Secondary | ICD-10-CM | POA: Diagnosis not present

## 2016-04-20 DIAGNOSIS — D333 Benign neoplasm of cranial nerves: Secondary | ICD-10-CM | POA: Diagnosis not present

## 2016-04-22 ENCOUNTER — Ambulatory Visit (INDEPENDENT_AMBULATORY_CARE_PROVIDER_SITE_OTHER): Payer: Commercial Managed Care - HMO | Admitting: Urology

## 2016-04-22 ENCOUNTER — Encounter: Payer: Self-pay | Admitting: Urology

## 2016-04-22 VITALS — BP 143/79 | HR 67 | Ht 67.5 in | Wt 198.8 lb

## 2016-04-22 DIAGNOSIS — N3281 Overactive bladder: Secondary | ICD-10-CM

## 2016-04-22 DIAGNOSIS — Z86018 Personal history of other benign neoplasm: Secondary | ICD-10-CM

## 2016-04-22 DIAGNOSIS — Z87448 Personal history of other diseases of urinary system: Secondary | ICD-10-CM

## 2016-04-22 DIAGNOSIS — Z8546 Personal history of malignant neoplasm of prostate: Secondary | ICD-10-CM | POA: Diagnosis not present

## 2016-04-22 DIAGNOSIS — Z87442 Personal history of urinary calculi: Secondary | ICD-10-CM | POA: Diagnosis not present

## 2016-04-22 DIAGNOSIS — N393 Stress incontinence (female) (male): Secondary | ICD-10-CM | POA: Diagnosis not present

## 2016-04-22 MED ORDER — FESOTERODINE FUMARATE ER 8 MG PO TB24: 8.0000 mg | ORAL_TABLET | Freq: Every day | ORAL | Status: DC

## 2016-04-22 NOTE — Progress Notes (Signed)
04/22/2016 3:53 PM   Gregory [name] Dec 12, 1941 [id number]  Referring provider: Tracie Harrier, MD [address] [facility] [place],  [address]  Chief Complaint  Patient presents with  . Follow-up    diagnostic study, Renal Ultrasound    HPI:   A 75 year old with multiple GU issues who returns today for follow-up status post left ureteroscopy, laser lithotripsy on 03/16/2016. His stent was subsequently removed on 03/24/2016. He returns today for follow-up with a negative renal ultrasound.  Left ureteral stone  4 mm left midureteral stone, 3 mm nonobstructing L stone s/p successful URS Follow-up renal ultrasound shows no residual stones or hydronephrosis. Stone analysis 97% calcium oxalate, 3% calcium phosphate He has no urinary complaints, no flank pain, or gross hematuria. 6 other previous remote stone episodes which has not required surgical intervention. His last episode was approximately 20 years ago.  History of benign bladder growth  TURBT in 2012, PUNLMP Cystoscopy annually, last 1 year ago  History of prostate cancer S/p open radical prostatectomy 2013 PSA 09/2015 0.01, stable  SUI Rare leakage, slightly worse since surgery   PMH: Past Medical History  Diagnosis Date  . Kidney stones   . Hypertension   . Cancer [facility])     Prostate  . Cancer ([place])     Melanoma  . 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 Cystoscopy with stent placement Left 02/23/2016    Procedure: CYSTOSCOPY WITH STENT PLACEMENT;  Surgeon: Dereck Leep, MD;  Location: [facility] ORS;  Service: Urology;  Laterality: Left;  . Cholecystectomy    . Prostatectomy  2003    Dr. Madelin Headings, [facility]  . Melanoma excision    . Brain surgery Left 2005    Brian Tumor (stem), [facility]  . Bladder surgery  2012    Dr. Madelin Headings, [facility]  . Cystoscopy with  stent placement Left 03/16/2016    Procedure: CYSTOSCOPY WITH STENT PLACEMENT/EXCHANGE;  Surgeon: Hollice Espy, MD;  Location: [facility] ORS;  Service: Urology;  Laterality: Left;  . Cystoscopy with retrograde urethrogram  03/16/2016    Procedure: CYSTOSCOPY WITH RETROGRADE URETHROGRAM;  Surgeon: Hollice Espy, MD;  Location: [facility] ORS;  Service: Urology;;  . Joaquim Lai extraction with basket  03/16/2016    Procedure: STONE EXTRACTION WITH BASKET;  Surgeon: Hollice Espy, MD;  Location: [facility] ORS;  Service: Urology;;    Home Medications:    Medication List       This list is accurate as of: 04/22/16 11:59 PM.  Always use your most recent med list.               atenolol 25 MG tablet  Commonly known as:  TENORMIN  Take 25 mg by mouth daily.     fesoterodine 8 MG Tb24 tablet  Commonly known as:  TOVIAZ  Take 1 tablet (8 mg total) by mouth daily.     fluorometholone 0.1 % ophthalmic suspension  Commonly known as:  FML  Place 1 drop into both eyes daily as needed. For dry/itchy eyes.     losartan-hydrochlorothiazide 100-25 MG tablet  Commonly known as:  HYZAAR  Take 1 tablet by mouth every morning.     tamsulosin 0.4 MG Caps capsule  Commonly known as:  FLOMAX  Take 1 capsule (0.4 mg total) by mouth daily.        Allergies:  Allergies  Allergen Reactions  . Iodine Anaphylaxis  . Other     Other reaction(s): Other (See Comments) Uncoded Allergy. Allergen: SHELLFISH, Other Reaction: FACIAL/THROAT SWELLING    Family History: History reviewed. No pertinent family history.  Social History:  reports that he has never smoked. He has never used smokeless tobacco. He reports that he does not drink alcohol or use illicit drugs.  ROS: UROLOGY Frequent Urination?: No Hard to postpone urination?: No Burning/pain with urination?: No Get up at night to urinate?: No Leakage of urine?: No Urine stream starts and stops?: No Trouble starting stream?: No Do you have to strain to urinate?:  No Blood in urine?: No Urinary tract infection?: No Sexually transmitted disease?: No Injury to kidneys or bladder?: No Painful intercourse?: No Weak stream?: No Erection problems?: No Penile pain?: No  Gastrointestinal Nausea?: No Vomiting?: No Indigestion/heartburn?: No Diarrhea?: No Constipation?: No  Constitutional Fever: No Night sweats?: No Weight loss?: No Fatigue?: No  Skin Skin rash/lesions?: No Itching?: No  Eyes Blurred vision?: No Double vision?: No  Ears/Nose/Throat Sore throat?: No Sinus problems?: No  Hematologic/Lymphatic Swollen glands?: No Easy bruising?: No  Cardiovascular Leg swelling?: No Chest pain?: No  Respiratory Cough?: No Shortness of breath?: No  Endocrine Excessive thirst?: No  Musculoskeletal Back pain?: No Joint pain?: No  Neurological Headaches?: No Dizziness?: No  Psychologic Depression?: No Anxiety?: No  Physical Exam: BP 143/79 mmHg  Pulse 67  Ht 5' 7.5" (1.715 m)  Wt 198 lb 12.8 oz (90.175 kg)  BMI 30.66 kg/m2  Constitutional:  Alert and oriented, No acute distress. HEENT: [place] AT, moist mucus membranes.  Trachea midline, no masses. Cardiovascular: No clubbing, cyanosis, or edema. Respiratory: Normal respiratory effort, no increased work of breathing. GI: Abdomen is soft, nontender, nondistended, no abdominal masses GU: No CVA tenderness.  Skin: No rashes, bruises or suspicious lesions. Neurologic: Grossly intact, no focal deficits, moving all 4 extremities. Psychiatric: Normal mood and affect.  Laboratory Data: Lab Results  Component Value Date   WBC 10.3 03/09/2016   HGB 14.8 03/09/2016   HCT 43.4 03/09/2016   MCV 87.7 03/09/2016   PLT 277 03/09/2016    Lab Results  Component Value Date   CREATININE 1.84* 02/24/2016    Urinalysis Results for orders placed or performed in visit on 03/24/16  Microscopic Examination  Result Value Ref Range   WBC, UA None seen 0 -  5 /hpf   RBC, UA >30  (A) 0 -  2 /hpf   Epithelial Cells (non renal) 0-10 0 - 10 /hpf   Bacteria, UA None seen None seen/Few  Urinalysis, Complete  Result Value Ref Range   Specific Gravity, UA 1.025 1.005 - 1.030   pH, UA 5.0 5.0 - 7.5   Color, UA Yellow Yellow   Appearance Ur Hazy (A) Clear   Leukocytes, UA Negative Negative   Protein, UA Negative Negative/Trace   Glucose, UA Negative Negative   Ketones, UA Negative Negative   RBC, UA 3+ (A) Negative   Bilirubin, UA Negative Negative   Urobilinogen, Ur 0.2 0.2 - 1.0 mg/dL   Nitrite, UA Negative Negative   Microscopic Examination See below:     Pertinent Imaging: Study Result     CLINICAL DATA: Left ureteric stone on prior CT.  EXAM: RENAL / URINARY TRACT ULTRASOUND COMPLETE  COMPARISON: 02/22/2016  FINDINGS: Right Kidney:  Length: 11.1 cm. Echogenicity within normal limits. No mass or hydronephrosis visualized.  Left Kidney:  Length: 11.8 cm. Resolution  of mild left-sided hydronephrosis. Left nephrolithiasis is not identified.  Bladder:  Appears normal for degree of bladder distention.  IMPRESSION: Resolution of left-sided hydronephrosis. Normal renal ultrasound for age.   Electronically Signed  By: Abigail Miyamoto M.D.  On: 04/15/2016 13:45        Assessment & Plan:    1. History of kidney stones F/u in 1 year with KUB Stone alasysis and KUB reviewed We discussed general stone prevention techniques including drinking plenty water with goal of producing 2.5 L urine daily, increased citric acid intake, avoidance of high oxalate containing foods, and decreased salt intake.  Information about dietary recommendations given today.  - DG Abd 1 View; Future  2. OAB (overactive bladder) - fesoterodine (TOVIAZ) 8 MG TB24 tablet; Take 1 tablet (8 mg total) by mouth daily.  Dispense: 90 each; Refill: 3  3. History of prostate cancer Check annually by PCP, due 09/2016  4. History of benign neoplasm of  bladder Patient wishes to abstain from further cysto given pathology and no recurrence x 5 years  5. SUI (stress urinary incontinence), male Worse following recent intervention in bladder Recommend Kegels Minimal bother  Return in about 1 year (around 04/22/2017) for KUB.  Hollice Espy, MD  [facility] [address], [place], [place] [address] [phone]

## 2016-05-01 ENCOUNTER — Other Ambulatory Visit: Payer: Self-pay | Admitting: Urology

## 2016-06-03 DIAGNOSIS — D170 Benign lipomatous neoplasm of skin and subcutaneous tissue of head, face and neck: Secondary | ICD-10-CM | POA: Diagnosis not present

## 2016-06-03 DIAGNOSIS — Z85820 Personal history of malignant melanoma of skin: Secondary | ICD-10-CM | POA: Diagnosis not present

## 2016-06-03 DIAGNOSIS — D485 Neoplasm of uncertain behavior of skin: Secondary | ICD-10-CM | POA: Diagnosis not present

## 2016-06-03 DIAGNOSIS — D229 Melanocytic nevi, unspecified: Secondary | ICD-10-CM | POA: Diagnosis not present

## 2016-06-03 DIAGNOSIS — D1800 Hemangioma unspecified site: Secondary | ICD-10-CM | POA: Diagnosis not present

## 2016-06-03 DIAGNOSIS — L821 Other seborrheic keratosis: Secondary | ICD-10-CM | POA: Diagnosis not present

## 2016-06-03 DIAGNOSIS — Z1283 Encounter for screening for malignant neoplasm of skin: Secondary | ICD-10-CM | POA: Diagnosis not present

## 2016-06-03 DIAGNOSIS — Z85828 Personal history of other malignant neoplasm of skin: Secondary | ICD-10-CM | POA: Diagnosis not present

## 2016-06-03 DIAGNOSIS — L578 Other skin changes due to chronic exposure to nonionizing radiation: Secondary | ICD-10-CM | POA: Diagnosis not present

## 2016-06-05 ENCOUNTER — Observation Stay
Admission: EM | Admit: 2016-06-05 | Discharge: 2016-06-06 | Disposition: A | Discharge status: Home / Self Care | Payer: Commercial Managed Care - HMO | Attending: Internal Medicine | Admitting: Internal Medicine

## 2016-06-05 ENCOUNTER — Encounter: Payer: Self-pay | Admitting: Emergency Medicine

## 2016-06-05 DIAGNOSIS — R739 Hyperglycemia, unspecified: Secondary | ICD-10-CM | POA: Diagnosis not present

## 2016-06-05 DIAGNOSIS — I959 Hypotension, unspecified: Secondary | ICD-10-CM | POA: Diagnosis not present

## 2016-06-05 DIAGNOSIS — R531 Weakness: Secondary | ICD-10-CM | POA: Diagnosis not present

## 2016-06-05 DIAGNOSIS — Z79899 Other long term (current) drug therapy: Secondary | ICD-10-CM | POA: Insufficient documentation

## 2016-06-05 DIAGNOSIS — Z87442 Personal history of urinary calculi: Secondary | ICD-10-CM | POA: Diagnosis not present

## 2016-06-05 DIAGNOSIS — N179 Acute kidney failure, unspecified: Principal | ICD-10-CM | POA: Insufficient documentation

## 2016-06-05 DIAGNOSIS — R3915 Urgency of urination: Secondary | ICD-10-CM | POA: Diagnosis not present

## 2016-06-05 DIAGNOSIS — D333 Benign neoplasm of cranial nerves: Secondary | ICD-10-CM | POA: Diagnosis not present

## 2016-06-05 DIAGNOSIS — R55 Syncope and collapse: Secondary | ICD-10-CM | POA: Insufficient documentation

## 2016-06-05 DIAGNOSIS — Z8546 Personal history of malignant neoplasm of prostate: Secondary | ICD-10-CM | POA: Diagnosis not present

## 2016-06-05 DIAGNOSIS — Z85820 Personal history of malignant melanoma of skin: Secondary | ICD-10-CM | POA: Insufficient documentation

## 2016-06-05 DIAGNOSIS — R8299 Other abnormal findings in urine: Secondary | ICD-10-CM | POA: Diagnosis not present

## 2016-06-05 DIAGNOSIS — N289 Disorder of kidney and ureter, unspecified: Secondary | ICD-10-CM | POA: Diagnosis not present

## 2016-06-05 DIAGNOSIS — Z91041 Radiographic dye allergy status: Secondary | ICD-10-CM | POA: Diagnosis not present

## 2016-06-05 DIAGNOSIS — I10 Essential (primary) hypertension: Secondary | ICD-10-CM | POA: Insufficient documentation

## 2016-06-05 DIAGNOSIS — Z9049 Acquired absence of other specified parts of digestive tract: Secondary | ICD-10-CM | POA: Insufficient documentation

## 2016-06-05 DIAGNOSIS — N3281 Overactive bladder: Secondary | ICD-10-CM | POA: Insufficient documentation

## 2016-06-05 DIAGNOSIS — Z91013 Allergy to seafood: Secondary | ICD-10-CM | POA: Insufficient documentation

## 2016-06-05 DIAGNOSIS — E860 Dehydration: Secondary | ICD-10-CM | POA: Diagnosis not present

## 2016-06-05 LAB — CBC WITH DIFFERENTIAL/PLATELET
Basophils Absolute: 0.1 10*3/uL (ref 0–0.1)
Basophils Relative: 1 %
EOS ABS: 0.1 10*3/uL (ref 0–0.7)
HCT: 43.4 % (ref 40.0–52.0)
Hemoglobin: 14.8 g/dL (ref 13.0–18.0)
Lymphs Abs: 1.9 10*3/uL (ref 1.0–3.6)
MCH: 29.7 pg (ref 26.0–34.0)
MCHC: 34 g/dL (ref 32.0–36.0)
MCV: 87.4 fL (ref 80.0–100.0)
Monocytes Absolute: 0.5 10*3/uL (ref 0.2–1.0)
Monocytes Relative: 4 %
Neutro Abs: 9.1 10*3/uL — ABNORMAL HIGH (ref 1.4–6.5)
Neutrophils Relative %: 77 %
PLATELETS: 243 10*3/uL (ref 150–440)
RBC: 4.96 MIL/uL (ref 4.40–5.90)
RDW: 15.5 % — AB (ref 11.5–14.5)
WBC: 11.6 10*3/uL — AB (ref 3.8–10.6)

## 2016-06-05 LAB — COMPREHENSIVE METABOLIC PANEL
ALK PHOS: 45 U/L (ref 38–126)
ALT: 19 U/L (ref 17–63)
ANION GAP: 12 (ref 5–15)
AST: 26 U/L (ref 15–41)
Albumin: 4.4 g/dL (ref 3.5–5.0)
BILIRUBIN TOTAL: 1 mg/dL (ref 0.3–1.2)
BUN: 38 mg/dL — AB (ref 6–20)
CALCIUM: 9.5 mg/dL (ref 8.9–10.3)
CO2: 23 mmol/L (ref 22–32)
Chloride: 104 mmol/L (ref 101–111)
Creatinine, Ser: 2.9 mg/dL — ABNORMAL HIGH (ref 0.61–1.24)
GFR calc Af Amer: 23 mL/min — ABNORMAL LOW (ref >60)
GFR, EST NON AFRICAN AMERICAN: 20 mL/min — AB (ref >60)
Glucose, Bld: 171 mg/dL — ABNORMAL HIGH (ref 65–99)
POTASSIUM: 3.2 mmol/L — AB (ref 3.5–5.1)
Sodium: 139 mmol/L (ref 135–145)
TOTAL PROTEIN: 7.3 g/dL (ref 6.5–8.1)

## 2016-06-05 LAB — TSH: TSH: 3.943 u[IU]/mL (ref 0.350–4.500)

## 2016-06-05 LAB — TROPONIN I

## 2016-06-05 MED ORDER — ACETAMINOPHEN 650 MG RE SUPP: 650.0000 mg | Freq: Four times a day (QID) | RECTAL | Status: DC | PRN

## 2016-06-05 MED ORDER — HYPROMELLOSE (GONIOSCOPIC) 2.5 % OP SOLN: 1.0000 [drp] | OPHTHALMIC | Status: DC | PRN

## 2016-06-05 MED ORDER — SODIUM CHLORIDE 0.9 % IV BOLUS (SEPSIS)
1000.0000 mL | Freq: Once | INTRAVENOUS | Status: AC
  Administered 2016-06-05: 1000 mL via INTRAVENOUS

## 2016-06-05 MED ORDER — SODIUM CHLORIDE 0.9% FLUSH: 3.0000 mL | Freq: Two times a day (BID) | INTRAVENOUS | Status: DC

## 2016-06-05 MED ORDER — HEPARIN SODIUM (PORCINE) 5000 UNIT/ML IJ SOLN
5000.0000 [IU] | Freq: Three times a day (TID) | INTRAMUSCULAR | Status: DC
  Administered 2016-06-05 – 2016-06-06 (×2): 5000 [IU] via SUBCUTANEOUS
  Filled 2016-06-05 (×2): qty 1

## 2016-06-05 MED ORDER — ACETAMINOPHEN 325 MG PO TABS: 650.0000 mg | ORAL_TABLET | Freq: Four times a day (QID) | ORAL | Status: DC | PRN

## 2016-06-05 MED ORDER — FESOTERODINE FUMARATE ER 8 MG PO TB24
8.0000 mg | ORAL_TABLET | Freq: Every day | ORAL | Status: DC
  Administered 2016-06-06: 8 mg via ORAL
  Filled 2016-06-05: qty 1

## 2016-06-05 MED ORDER — SODIUM CHLORIDE 0.9 % IV SOLN
INTRAVENOUS | Status: DC
  Administered 2016-06-05: via INTRAVENOUS

## 2016-06-05 MED ORDER — ONDANSETRON HCL 4 MG PO TABS
4.0000 mg | ORAL_TABLET | Freq: Four times a day (QID) | ORAL | Status: DC | PRN
Start: 2016-06-05 — End: 2016-06-06

## 2016-06-05 MED ORDER — POLYVINYL ALCOHOL 1.4 % OP SOLN
1.0000 [drp] | OPHTHALMIC | Status: DC | PRN
  Filled 2016-06-05: qty 15

## 2016-06-05 MED ORDER — ONDANSETRON HCL 4 MG/2ML IJ SOLN: 4.0000 mg | Freq: Four times a day (QID) | INTRAMUSCULAR | Status: DC | PRN

## 2016-06-05 MED ORDER — POTASSIUM CHLORIDE CRYS ER 20 MEQ PO TBCR
40.0000 meq | EXTENDED_RELEASE_TABLET | Freq: Two times a day (BID) | ORAL | Status: AC
  Administered 2016-06-05 – 2016-06-06 (×2): 40 meq via ORAL
  Filled 2016-06-05 (×2): qty 2

## 2016-06-05 NOTE — ED Provider Notes (Signed)
[facility] Emergency Department Provider Note  Time seen: 8:42 PM  I have reviewed the triage vital signs and the nursing notes.   HISTORY  Chief Complaint Near Syncope    HPI Gregory [name] is a 75 y.o. male with a past medical history of hypertension, hyperlipidemia who presents the emergency department for lightheadedness/near syncope. According to the patient around 6:30 PM he began feeling somewhat lightheaded. He went inside to sit down in the air conditioning. A friend states that he put his head down so he put some water on his head and the patient woke back up. He is not sure if the patient completely passed out or not. The patient states a history of syncope in the past. Denies any chest pain and nausea or diaphoresis. Patient believes he got "overheated."     Past Medical History  Diagnosis Date  . Kidney stones   . Hypertension   . Cancer [facility])     Prostate  . Cancer ([place])     Melanoma  . 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 Calculus of kidney 04/20/2016  . Acoustic neuroma ([place]) 03/24/2016  . Allergic state 03/24/2016  . BP (high blood pressure) 03/24/2016  . Left ureteral stone 02/22/2016  . Kidney stone   . Left flank pain   . Malignant neoplasm of prostate ([place]) 03/04/2015  . H/O Malignant melanoma 03/04/2015    Past Surgical History  Procedure Laterality Date  . Cystoscopy with stent placement Left 02/23/2016    Procedure: CYSTOSCOPY WITH STENT PLACEMENT;  Surgeon: Dereck Leep, MD;  Location: [facility] ORS;  Service: Urology;  Laterality: Left;  . Cholecystectomy    . Prostatectomy  2003    Dr. Madelin Headings, [facility]  . Melanoma excision    . Brain surgery Left 2005    Brian Tumor (stem), [facility]  . Bladder surgery  2012    Dr. Madelin Headings, [facility]  . Cystoscopy with stent placement Left 03/16/2016    Procedure:  CYSTOSCOPY WITH STENT PLACEMENT/EXCHANGE;  Surgeon: Hollice Espy, MD;  Location: [facility] ORS;  Service: Urology;  Laterality: Left;  . Cystoscopy with retrograde urethrogram  03/16/2016    Procedure: CYSTOSCOPY WITH RETROGRADE URETHROGRAM;  Surgeon: Hollice Espy, MD;  Location: [facility] ORS;  Service: Urology;;  . Joaquim Lai extraction with basket  03/16/2016    Procedure: STONE EXTRACTION WITH BASKET;  Surgeon: Hollice Espy, MD;  Location: [facility] ORS;  Service: Urology;;    Current Outpatient Rx  Name  Route  Sig  Dispense  Refill  . atenolol (TENORMIN) 25 MG tablet   Oral   Take 25 mg by mouth daily.         . fesoterodine (TOVIAZ) 8 MG TB24 tablet   Oral   Take 1 tablet (8 mg total) by mouth daily.   90 each   3   . fluorometholone (FML) 0.1 % ophthalmic suspension   Both Eyes   Place 1 drop into both eyes daily as needed. For dry/itchy eyes.         Marland Kitchen losartan-hydrochlorothiazide (HYZAAR) 100-25 MG tablet   Oral   Take 1 tablet by mouth every morning.         . tamsulosin (FLOMAX) 0.4 MG CAPS capsule   Oral   Take 1 capsule (0.4 mg total) by mouth daily. Patient not taking: Reported  on 04/22/2016   30 capsule   0     Allergies Iodine and Other  History reviewed. No pertinent family history.  Social History Social History  Substance Use Topics  . Smoking status: Never Smoker   . Smokeless tobacco: Never Used  . Alcohol Use: No    Review of Systems Constitutional: Negative for fever. Cardiovascular: Negative for chest pain. Respiratory: Negative for shortness of breath. Gastrointestinal: Negative for abdominal pain Musculoskeletal: Negative for back pain. Neurological: Negative for headache 10-point ROS otherwise negative.  ____________________________________________   PHYSICAL EXAM:  VITAL SIGNS: ED Triage Vitals  Enc Vitals Group     BP 06/05/16 2019 111/56 mmHg     Pulse Rate 06/05/16 2019 64     Resp 06/05/16 2019 20     Temp 06/05/16 2019 97.7 F  (36.5 C)     Temp Source 06/05/16 2019 Oral     SpO2 06/05/16 2019 99 %     Weight 06/05/16 2019 200 lb (90.719 kg)     Height 06/05/16 2019 5\' 8"  (1.727 m)     Head Cir --      Peak Flow --      Pain Score --      Pain Loc --      Pain Edu? --      Excl. in [place]? --     Constitutional: Alert and oriented. Well appearing and in no distress. Eyes: Normal exam ENT   Head: Normocephalic and atraumatic.   Mouth/Throat: Mucous membranes are moist. Cardiovascular: Normal rate, regular rhythm. No murmur Respiratory: Normal respiratory effort without tachypnea nor retractions. Breath sounds are clear  Gastrointestinal: Soft and nontender. No distention.   Musculoskeletal: Nontender with normal range of motion in all extremities.  Neurologic:  Normal speech and language. No gross focal neurologic deficits Skin:  Skin is warm, dry and intact.  Psychiatric: Mood and affect are normal.   ____________________________________________    EKG  EKG reviewed and interpreted by myself shows normal sinus rhythm at 88 bpm, narrow QRS, normal axis, normal intervals, nonspecific but no concerning ST changes.   INITIAL IMPRESSION / ASSESSMENT AND PLAN / ED COURSE  Pertinent labs & imaging results that were available during my care of the patient were reviewed by me and considered in my medical decision making (see chart for details).  Patient presents after a possible syncopal versus near syncopal episode and. Patient states he largely feels normal besides a feeling of fatigue. Denies chest pain at any time. We will check labs, IV hydrate. EKG is reassuring.  Patient's labs show an elevated creatinine of 2.9. Patient's baseline creatinine is 1.2. Patient denies any new medications. States he does feel dehydrated feels very fatigued in the emergency department. Given his near syncopal/syncopal episode, with acute renal insufficiency billing admit the patient for further treatment and monitoring.  Patient agreeable to plan of care.  ____________________________________________   FINAL CLINICAL IMPRESSION(S) / ED DIAGNOSES  Near-syncope Acute renal insufficiency  Harvest Dark, MD 06/05/16 2146

## 2016-06-05 NOTE — ED Notes (Signed)
Admitting MD at bedside.

## 2016-06-05 NOTE — H&P (Signed)
[place] at [place] NAME: Gregory [name]    MR#:  [id number]  DATE OF BIRTH:  1941-06-01  DATE OF ADMISSION:  06/05/2016  PRIMARY CARE PHYSICIAN: Tracie Harrier, MD   REQUESTING/REFERRING PHYSICIAN: Dr. Marc Morgans in [place]:   Chief Complaint  Patient presents with  . Near Syncope    HISTORY OF PRESENT ILLNESS:  Gregory [name]  is a 75 y.o. 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 Working pretty hard and said it has been very warm, trying to drink plenty of fluids. Said he felt a little overheated today. This afternoon about 6 PM patient went into a shed with air conditioner, began to feel lightheaded and passed out for about 30 seconds. Denied headache or blurry vision, did not have any chest pain or palpitations, denied shortness of breath. Denied any focal neurologic deficit afterwards. No seizure-like activity, no postictal state. He reports decreased urine output today, urine appears dark yellow in color. Denied any dysuria or frequency but does have the urgency due to overactive bladder. Denied recent change to antihypertensives, no recent diarrhea.   In the emergency room is been afebrile most recent temperature 97.7, blood pressure has been soft at 94/65. While he was in route Hospital patient received 500 cc fluid bolus. He's been given another 1 L in the emergency room. CBC was benign with a normal blood cells 11.6. Metabolic panel significant for a mildly decreased potassium of 3.2, elevated BUN of 38 and a creatinine of 2.9. From prior hospitalizations. His baseline creatinine is about 1.5.  Glucose was 171. Urinalysis currently pending. Because of the acute kidney injury and the near-syncopal event the hospitalist called to admit.  PAST MEDICAL HISTORY:   Past Medical History  Diagnosis Date  . Kidney stones   . Hypertension   . Cancer [facility])     Prostate  . Cancer ([place])     Melanoma  . 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 Cystoscopy with stent placement Left 02/23/2016    Procedure: CYSTOSCOPY WITH STENT PLACEMENT;  Surgeon: Dereck Leep, MD;  Location: [facility] ORS;  Service: Urology;  Laterality: Left;  . Cholecystectomy    . Prostatectomy  2003    Dr. Madelin Headings, [facility]  . Melanoma excision    . Brain surgery Left 2005    Brian Tumor (stem), [facility]  . Bladder surgery  2012    Dr. Madelin Headings, [facility]  . Cystoscopy with stent placement Left 03/16/2016    Procedure: CYSTOSCOPY WITH STENT PLACEMENT/EXCHANGE;  Surgeon: Hollice Espy, MD;  Location: [facility] ORS;  Service: Urology;  Laterality: Left;  . Cystoscopy with retrograde urethrogram  03/16/2016    Procedure: CYSTOSCOPY WITH RETROGRADE URETHROGRAM;  Surgeon: Hollice Espy, MD;  Location: [facility] ORS;  Service: Urology;;  . Joaquim Lai extraction with basket  03/16/2016    Procedure: STONE EXTRACTION WITH BASKET;  Surgeon: Hollice Espy, MD;  Location: [facility] ORS;  Service: Urology;;    SOCIAL HISTORY:   Social  History  Substance Use Topics  . Smoking status: Never Smoker   . Smokeless tobacco: Never Used  . Alcohol Use: No    FAMILY HISTORY:  History reviewed. No pertinent family history.  DRUG ALLERGIES:   Allergies  Allergen Reactions  . Iodine Anaphylaxis  . Shellfish Allergy Anaphylaxis    REVIEW OF SYSTEMS:   ROS  MEDICATIONS AT HOME:   Prior to Admission medications   Medication Sig Start Date End Date Taking? Authorizing Provider  atenolol (TENORMIN) 25 MG tablet  Take 25 mg by mouth daily.   Yes Historical Provider, MD  fesoterodine (TOVIAZ) 8 MG TB24 tablet Take 1 tablet (8 mg total) by mouth daily. 04/22/16  Yes Hollice Espy, MD  hydroxypropyl methylcellulose / hypromellose (ISOPTO TEARS / GONIOVISC) 2.5 % ophthalmic solution Place 1 drop into both eyes as needed for dry eyes.   Yes Historical Provider, MD  losartan-hydrochlorothiazide (HYZAAR) 100-25 MG tablet Take 1 tablet by mouth daily.    Yes Historical Provider, MD      VITAL SIGNS:  Blood pressure 94/65, pulse 74, temperature 97.7 F (36.5 C), temperature source Oral, resp. rate 17, height 5\' 8"  (1.727 m), weight 90.719 kg (200 lb), SpO2 98 %.  PHYSICAL EXAMINATION:  Physical Exam  GENERAL:  75 y.o.-year-old patient lying in the bed with no acute distress.  EYES: Pupils equal, round, reactive to light and accommodation. No scleral icterus. Extraocular muscles intact.  HEENT: Head atraumatic, normocephalic. Oropharynx and nasopharynx clear.  NECK:  Supple, no jugular venous distention. No thyroid enlargement, no tenderness.  LUNGS: Normal breath sounds bilaterally, no wheezing, rales,rhonchi or crepitation. No use of accessory muscles of respiration.  CARDIOVASCULAR: S1, S2 normal. No murmurs, rubs, or gallops.  ABDOMEN: Soft, nontender, nondistended. Bowel sounds present. No organomegaly or mass.  EXTREMITIES: No pedal edema, cyanosis, or clubbing.  NEUROLOGIC: Cranial nerves II through XII are intact. Muscle strength 5/5 in all extremities. Sensation intact. Gait not checked. Appears to have some trouble with finger to nose and heel to shin on his left upper and lower extremity due to the prior brainstem tumor. This coordination is delayed. PSYCHIATRIC: The patient is alert and oriented x 3.  SKIN: No obvious rash, lesion, or ulcer.   LABORATORY PANEL:   CBC  Recent Labs Lab 06/05/16 2020  WBC 11.6*  HGB 14.8  HCT 43.4  PLT 243    ------------------------------------------------------------------------------------------------------------------  Chemistries   Recent Labs Lab 06/05/16 2020  NA 139  K 3.2*  CL 104  CO2 23  GLUCOSE 171*  BUN 38*  CREATININE 2.90*  CALCIUM 9.5  AST 26  ALT 19  ALKPHOS 45  BILITOT 1.0   ------------------------------------------------------------------------------------------------------------------  Cardiac Enzymes  Recent Labs Lab 06/05/16 2020  TROPONINI <0.03   ------------------------------------------------------------------------------------------------------------------  RADIOLOGY:  No results found.    IMPRESSION AND PLAN:   59 male admitted to observation for acute kidney injury and near syncopal.  1. Acute kidney injury. Patient stated recently been working outside with hot, felt overheated. He does have elevated BUN and creatinine. Baseline creatinine about 1.5, currently 2.9. He is on an ARB so we'll hold that and any other nephrotoxic medications at this time. In the past has had a bladder outlet obstruction because of acute kidney injury. We'll repeat a renal ultrasound, recently had one done though April 26 and at that time had a left-sided hydronephrosis which had resolved after he had stent placement. The patient IV fluid normal saline 1 25 cc per hour and  repeat her bone panel in the morning  2. Near-syncope. Blood pressure is on arrival to the ER, may be medication related in addition to some dehydration. Will hold his antihypertensives including atenolol 25 mg daily, losartan 100 chlorothiazide combination pill at this time. Continue with IV fluid and restart piecemeal. We'll check echocardiogram, monitor on telemetry.  3. Hyperglycemia. Glucose elevated at 171, check A1c. Continue to monitor.  4. Hypotension. As above and per #2. Provide IV hydration and restart antihypertensives as needed.  5. DVT prophylaxis. Heparin 3 times a day.  Case  discussed with patient's wife at bedside.  All the records are reviewed and case discussed with ED provider. Management plans discussed with the patient, family and they are in agreement.  CODE STATUS: Full code  TOTAL TIME TAKING CARE OF THIS PATIENT: 40 minutes.    Alesia Richards M.D on 06/05/2016 at 10:12 PM  Between 7am to 6pm - Pager - [phone]  After 6pm go to [email or web address] - Proofreader  [employer]  [facility]  Office  [phone]  CC: Primary care physician; Tracie Harrier, MD   Note: This dictation was prepared with Dragon dictation along with smaller phrase technology. Any transcriptional errors that result from this process are unintentional.

## 2016-06-05 NOTE — ED Notes (Addendum)
Patient presents to Emergency Department via EMS with complaints of feeling dizzy.  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  PT reports "tunnel vision" per coworker no LOC or head strike.    Hx of HTN, A&O x4.

## 2016-06-06 ENCOUNTER — Observation Stay: Payer: Commercial Managed Care - HMO

## 2016-06-06 DIAGNOSIS — N179 Acute kidney failure, unspecified: Secondary | ICD-10-CM | POA: Diagnosis not present

## 2016-06-06 DIAGNOSIS — R55 Syncope and collapse: Secondary | ICD-10-CM | POA: Diagnosis not present

## 2016-06-06 LAB — URINALYSIS COMPLETE WITH MICROSCOPIC (ARMC ONLY)
BILIRUBIN URINE: NEGATIVE
GLUCOSE, UA: NEGATIVE mg/dL
Ketones, ur: NEGATIVE mg/dL
Leukocytes, UA: NEGATIVE
Nitrite: NEGATIVE
PH: 5 (ref 5.0–8.0)
Protein, ur: NEGATIVE mg/dL
Specific Gravity, Urine: 1.014 (ref 1.005–1.030)

## 2016-06-06 LAB — HEMOGLOBIN A1C: HEMOGLOBIN A1C: 5.5 % (ref 4.0–6.0)

## 2016-06-06 LAB — BASIC METABOLIC PANEL
ANION GAP: 6 (ref 5–15)
BUN: 33 mg/dL — AB (ref 6–20)
CHLORIDE: 108 mmol/L (ref 101–111)
CO2: 27 mmol/L (ref 22–32)
Calcium: 8.4 mg/dL — ABNORMAL LOW (ref 8.9–10.3)
Creatinine, Ser: 1.95 mg/dL — ABNORMAL HIGH (ref 0.61–1.24)
GFR calc Af Amer: 38 mL/min — ABNORMAL LOW (ref >60)
GFR, EST NON AFRICAN AMERICAN: 32 mL/min — AB (ref >60)
GLUCOSE: 107 mg/dL — AB (ref 65–99)
POTASSIUM: 3.6 mmol/L (ref 3.5–5.1)
Sodium: 141 mmol/L (ref 135–145)

## 2016-06-06 NOTE — Progress Notes (Signed)
Patient admitted without any complication. Instructions explained. Patient in agreement with plan of care. Will continue to monitor and assess.

## 2016-06-06 NOTE — Progress Notes (Signed)
Discharge instructions explained to pt/ verbalized an understanding/ iv and tele removed/ will transport off unit via wheelchair.  

## 2016-06-06 NOTE — Care Management Obs Status (Signed)
[facility] NOTIFICATION   Patient Details  Name: Gregory [name] MRN: [id number] Date of Birth: [birth year]   Medicare Observation Status Notification Given:  No (in house less than 24 hours)    Ival Bible, RN 06/06/2016, 5:08 PM

## 2016-06-06 NOTE — Discharge Summary (Signed)
[place] at [place] NAME: Gregory [name]    MR#:  [id number]  DATE OF BIRTH:  02/11/1941  DATE OF ADMISSION:  06/05/2016 ADMITTING PHYSICIAN: Alesia Richards, MD  DATE OF DISCHARGE: 06/06/2016  PRIMARY CARE PHYSICIAN: Tracie Harrier, MD    ADMISSION DIAGNOSIS:  Acute renal insufficiency [N28.9] AKI (acute kidney injury) ([place]) [N17.9] Syncope, unspecified syncope type [R55]  DISCHARGE DIAGNOSIS:  Syncope, unspecified AKI, prerenal  SECONDARY DIAGNOSIS:   Past Medical History  Diagnosis Date  . Kidney stones   . Hypertension   . Cancer [facility])     Prostate  . Cancer ([place])     Melanoma  . 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 male admitted 06/05/2016 with chief complaint passed out. Please see H&P performed by Alesia Richards, MD for further information. Presented after syncopal episode after working outside in the heat. He was noted to have AKI on admission as well with Cr 2.9. After receiving IV fluid hydration kidney function returned to baseline. No abnormal cardiac activity noted on telemetry.   DISCHARGE CONDITIONS:   Stable/improved  CONSULTS OBTAINED:     DRUG ALLERGIES:   Allergies  Allergen Reactions  . Iodine Anaphylaxis  . Shellfish Allergy Anaphylaxis    DISCHARGE MEDICATIONS:   Current Discharge Medication List    CONTINUE these medications which have NOT CHANGED   Details  atenolol (TENORMIN) 25 MG tablet Take 25 mg by mouth daily.    fesoterodine (TOVIAZ) 8 MG TB24 tablet Take 1 tablet (8 mg total) by mouth daily. Qty: 90 each, Refills: 3   Associated Diagnoses: OAB (overactive bladder)    hydroxypropyl methylcellulose / hypromellose (ISOPTO TEARS / GONIOVISC) 2.5 % ophthalmic solution Place 1 drop into both eyes as needed for dry eyes.    losartan-hydrochlorothiazide (HYZAAR) 100-25 MG tablet Take 1  tablet by mouth daily.          DISCHARGE INSTRUCTIONS:    DIET:  Cardiac diet  DISCHARGE CONDITION:  Good  ACTIVITY:  Activity as tolerated  OXYGEN:  Home Oxygen: No.   Oxygen Delivery: room air  DISCHARGE LOCATION:  home   If you experience worsening of your admission symptoms, develop shortness of breath, life threatening emergency, suicidal or homicidal thoughts you must seek medical attention immediately by calling 911 or calling your MD immediately  if symptoms less severe.  You Must read complete instructions/literature along with all the possible adverse reactions/side effects for all the Medicines you take and that have been prescribed to you. Take any new Medicines after you have completely understood and accpet all the possible adverse reactions/side effects.   Please note  You were cared for by a hospitalist during your hospital stay. If you have any questions about your discharge medications or the care you received while you were in the hospital after you are discharged, you can call the unit and asked to speak with the hospitalist on call if the hospitalist that took care of you is not available. Once you are discharged, your primary care physician will handle any further medical issues. Please note that NO REFILLS for any discharge medications will be authorized once you are discharged, as it is imperative that you return to your primary care physician (or establish a relationship with a primary care physician if you do not have one) for your  aftercare needs so that they can reassess your need for medications and monitor your lab values.    On the day of Discharge:   VITAL SIGNS:  Blood pressure 111/63, pulse 59, temperature 98 F (36.7 C), temperature source Oral, resp. rate 16, height 5\' 8"  (1.727 m), weight 191 lb 12.8 oz (87 kg), SpO2 100 %.  I/O:   Intake/Output Summary (Last 24 hours) at 06/06/16 0833 Last data filed at 06/06/16 0737  Gross per 24  hour  Intake 879.17 ml  Output    350 ml  Net 529.17 ml    PHYSICAL EXAMINATION:  GENERAL:  75 y.o.-year-old patient lying in the bed with no acute distress.  EYES: Pupils equal, round, reactive to light and accommodation. No scleral icterus. Extraocular muscles intact.  HEENT: Head atraumatic, normocephalic. Oropharynx and nasopharynx clear.  NECK:  Supple, no jugular venous distention. No thyroid enlargement, no tenderness.  LUNGS: Normal breath sounds bilaterally, no wheezing, rales,rhonchi or crepitation. No use of accessory muscles of respiration.  CARDIOVASCULAR: S1, S2 normal. No murmurs, rubs, or gallops.  ABDOMEN: Soft, non-tender, non-distended. Bowel sounds present. No organomegaly or mass.  EXTREMITIES: No pedal edema, cyanosis, or clubbing.  NEUROLOGIC: Cranial nerves II through XII are intact. Muscle strength 5/5 in all extremities. Sensation intact. Gait not checked.  PSYCHIATRIC: The patient is alert and oriented x 3.  SKIN: No obvious rash, lesion, or ulcer.   DATA REVIEW:   CBC  Recent Labs Lab 06/05/16 2020  WBC 11.6*  HGB 14.8  HCT 43.4  PLT 243    Chemistries   Recent Labs Lab 06/05/16 2020 06/06/16 0515  NA 139 141  K 3.2* 3.6  CL 104 108  CO2 23 27  GLUCOSE 171* 107*  BUN 38* 33*  CREATININE 2.90* 1.95*  CALCIUM 9.5 8.4*  AST 26  --   ALT 19  --   ALKPHOS 45  --   BILITOT 1.0  --     Cardiac Enzymes  Recent Labs Lab 06/05/16 2020  TROPONINI <0.03    Microbiology Results  Results for orders placed or performed in visit on 03/24/16  Microscopic Examination     Status: Abnormal   Collection Time: 03/24/16  2:21 PM  Result Value Ref Range Status   WBC, UA None seen 0 -  5 /hpf Final   RBC, UA >30 (A) 0 -  2 /hpf Final   Epithelial Cells (non renal) 0-10 0 - 10 /hpf Final   Bacteria, UA None seen None seen/Few Final    RADIOLOGY:  No results found.   Management plans discussed with the patient, family and they are in  agreement.  CODE STATUS:     Code Status Orders        Start     Ordered   06/05/16 2209  Full code   Continuous     06/05/16 2210    Code Status History    Date Active Date Inactive Code Status Order ID Comments User Context   This patient has a current code status but no historical code status.      TOTAL TIME TAKING CARE OF THIS PATIENT: 28 minutes.    Deaira Leckey,  Karenann Cai.D on 06/06/2016 at 8:33 AM  Between 7am to 6pm - Pager - [phone]  After 6pm go to [email or web address] - Technical brewer [place] [facility]  Office  [phone]  CC: Primary care physician; Tracie Harrier, MD

## 2016-08-20 DIAGNOSIS — H40003 Preglaucoma, unspecified, bilateral: Secondary | ICD-10-CM | POA: Diagnosis not present

## 2016-08-27 DIAGNOSIS — D2312 Other benign neoplasm of skin of left eyelid, including canthus: Secondary | ICD-10-CM | POA: Diagnosis not present

## 2016-10-15 DIAGNOSIS — N200 Calculus of kidney: Secondary | ICD-10-CM | POA: Diagnosis not present

## 2016-10-15 DIAGNOSIS — I10 Essential (primary) hypertension: Secondary | ICD-10-CM | POA: Diagnosis not present

## 2016-10-15 DIAGNOSIS — T7840XA Allergy, unspecified, initial encounter: Secondary | ICD-10-CM | POA: Diagnosis not present

## 2016-10-15 DIAGNOSIS — C61 Malignant neoplasm of prostate: Secondary | ICD-10-CM | POA: Diagnosis not present

## 2016-10-15 DIAGNOSIS — D333 Benign neoplasm of cranial nerves: Secondary | ICD-10-CM | POA: Diagnosis not present

## 2016-10-22 DIAGNOSIS — M25552 Pain in left hip: Secondary | ICD-10-CM | POA: Diagnosis not present

## 2016-10-22 DIAGNOSIS — C61 Malignant neoplasm of prostate: Secondary | ICD-10-CM | POA: Diagnosis not present

## 2016-10-22 DIAGNOSIS — Z0000 Encounter for general adult medical examination without abnormal findings: Secondary | ICD-10-CM | POA: Diagnosis not present

## 2016-10-22 DIAGNOSIS — Z23 Encounter for immunization: Secondary | ICD-10-CM | POA: Diagnosis not present

## 2016-10-22 DIAGNOSIS — Z85820 Personal history of malignant melanoma of skin: Secondary | ICD-10-CM | POA: Diagnosis not present

## 2016-10-22 DIAGNOSIS — I10 Essential (primary) hypertension: Secondary | ICD-10-CM | POA: Diagnosis not present

## 2016-10-22 DIAGNOSIS — N200 Calculus of kidney: Secondary | ICD-10-CM | POA: Diagnosis not present

## 2016-10-22 DIAGNOSIS — T7840XA Allergy, unspecified, initial encounter: Secondary | ICD-10-CM | POA: Diagnosis not present

## 2016-12-21 DIAGNOSIS — I214 Non-ST elevation (NSTEMI) myocardial infarction: Secondary | ICD-10-CM

## 2016-12-21 HISTORY — DX: Non-ST elevation (NSTEMI) myocardial infarction: I21.4

## 2016-12-23 DIAGNOSIS — D485 Neoplasm of uncertain behavior of skin: Secondary | ICD-10-CM | POA: Diagnosis not present

## 2016-12-25 DIAGNOSIS — E042 Nontoxic multinodular goiter: Secondary | ICD-10-CM | POA: Diagnosis not present

## 2017-01-01 DIAGNOSIS — E042 Nontoxic multinodular goiter: Secondary | ICD-10-CM | POA: Diagnosis not present

## 2017-02-24 DIAGNOSIS — D485 Neoplasm of uncertain behavior of skin: Secondary | ICD-10-CM | POA: Diagnosis not present

## 2017-02-24 DIAGNOSIS — L821 Other seborrheic keratosis: Secondary | ICD-10-CM | POA: Diagnosis not present

## 2017-02-24 DIAGNOSIS — D210 Benign neoplasm of connective and other soft tissue of head, face and neck: Secondary | ICD-10-CM | POA: Diagnosis not present

## 2017-04-14 DIAGNOSIS — C61 Malignant neoplasm of prostate: Secondary | ICD-10-CM | POA: Diagnosis not present

## 2017-04-14 DIAGNOSIS — Z85820 Personal history of malignant melanoma of skin: Secondary | ICD-10-CM | POA: Diagnosis not present

## 2017-04-14 DIAGNOSIS — I10 Essential (primary) hypertension: Secondary | ICD-10-CM | POA: Diagnosis not present

## 2017-04-14 DIAGNOSIS — T7840XA Allergy, unspecified, initial encounter: Secondary | ICD-10-CM | POA: Diagnosis not present

## 2017-04-14 DIAGNOSIS — M159 Polyosteoarthritis, unspecified: Secondary | ICD-10-CM | POA: Diagnosis not present

## 2017-04-14 DIAGNOSIS — N200 Calculus of kidney: Secondary | ICD-10-CM | POA: Diagnosis not present

## 2017-04-14 DIAGNOSIS — Z0000 Encounter for general adult medical examination without abnormal findings: Secondary | ICD-10-CM | POA: Diagnosis not present

## 2017-04-14 DIAGNOSIS — Z23 Encounter for immunization: Secondary | ICD-10-CM | POA: Diagnosis not present

## 2017-04-21 DIAGNOSIS — T7840XA Allergy, unspecified, initial encounter: Secondary | ICD-10-CM | POA: Diagnosis not present

## 2017-04-21 DIAGNOSIS — D333 Benign neoplasm of cranial nerves: Secondary | ICD-10-CM | POA: Diagnosis not present

## 2017-04-21 DIAGNOSIS — N200 Calculus of kidney: Secondary | ICD-10-CM | POA: Diagnosis not present

## 2017-04-21 DIAGNOSIS — Z85820 Personal history of malignant melanoma of skin: Secondary | ICD-10-CM | POA: Diagnosis not present

## 2017-04-21 DIAGNOSIS — I10 Essential (primary) hypertension: Secondary | ICD-10-CM | POA: Diagnosis not present

## 2017-04-21 DIAGNOSIS — Z0000 Encounter for general adult medical examination without abnormal findings: Secondary | ICD-10-CM | POA: Diagnosis not present

## 2017-04-21 DIAGNOSIS — C61 Malignant neoplasm of prostate: Secondary | ICD-10-CM | POA: Diagnosis not present

## 2017-04-23 ENCOUNTER — Encounter: Payer: Self-pay | Admitting: Urology

## 2017-04-23 ENCOUNTER — Ambulatory Visit
Admission: RE | Admit: 2017-04-23 | Discharge: 2017-04-23 | Disposition: A | Discharge status: Home / Self Care | Payer: Medicare HMO | Source: Ambulatory Visit | Attending: Urology | Admitting: Urology

## 2017-04-23 ENCOUNTER — Ambulatory Visit (INDEPENDENT_AMBULATORY_CARE_PROVIDER_SITE_OTHER): Payer: Medicare HMO | Admitting: Urology

## 2017-04-23 ENCOUNTER — Telehealth: Payer: Self-pay

## 2017-04-23 VITALS — BP 170/79 | HR 81 | Ht 68.0 in | Wt 209.0 lb

## 2017-04-23 DIAGNOSIS — Z8546 Personal history of malignant neoplasm of prostate: Secondary | ICD-10-CM | POA: Diagnosis not present

## 2017-04-23 DIAGNOSIS — Z87442 Personal history of urinary calculi: Secondary | ICD-10-CM | POA: Diagnosis not present

## 2017-04-23 DIAGNOSIS — Z9079 Acquired absence of other genital organ(s): Secondary | ICD-10-CM | POA: Insufficient documentation

## 2017-04-23 DIAGNOSIS — N200 Calculus of kidney: Secondary | ICD-10-CM

## 2017-04-23 DIAGNOSIS — N3281 Overactive bladder: Secondary | ICD-10-CM | POA: Diagnosis not present

## 2017-04-23 DIAGNOSIS — N393 Stress incontinence (female) (male): Secondary | ICD-10-CM

## 2017-04-23 LAB — URINALYSIS, COMPLETE
Bilirubin, UA: NEGATIVE
GLUCOSE, UA: NEGATIVE
KETONES UA: NEGATIVE
Leukocytes, UA: NEGATIVE
NITRITE UA: NEGATIVE
RBC, UA: NEGATIVE
SPEC GRAV UA: 1.025 (ref 1.005–1.030)
Urobilinogen, Ur: 0.2 mg/dL (ref 0.2–1.0)
pH, UA: 5.5 (ref 5.0–7.5)

## 2017-04-23 LAB — MICROSCOPIC EXAMINATION: RBC MICROSCOPIC, UA: NONE SEEN /HPF (ref 0–?)

## 2017-04-23 MED ORDER — FESOTERODINE FUMARATE ER 8 MG PO TB24: 8.0000 mg | ORAL_TABLET | Freq: Every day | ORAL | 3 refills | Status: DC

## 2017-04-23 NOTE — Telephone Encounter (Signed)
Patient returned call and was advised of the message.  He expressed understanding.

## 2017-04-23 NOTE — Telephone Encounter (Signed)
-----   Message from Hollice Espy, MD sent at 04/23/2017 12:22 PM EDT ----- No obvious stones on KUB. Great news. See you next year.  Hollice Espy, MD

## 2017-04-23 NOTE — Progress Notes (Signed)
04/23/2017 9:58 AM   Gregory [name] 26-Jul-1941 [id number]  Referring provider: Tracie Harrier, MD [address] [facility] [place], [place] [address]  Chief Complaint  Patient presents with  . Nephrolithiasis    1year    HPI:  76 year old male with multiple GU issues who presents today for routine annual follow-up.  History of kidney stones stone  Pre2017- 6 other previous remote stone episodes which has not required surgical intervention  s/p L URS 02/2016 for 4 mm left midureteral stone, 3 mm nonobstructing L stone  Stone analysis 97% calcium oxalate, 3% calcium phosphate Pre2017- 6 other previous remote stone episodes which has not required surgical intervention No flank pain, gross hematuria, or any stone episodes in the past year  History of benign bladder growth  TURBT in 2012, PUNLMP Previously followed by cystoscopy annually, last 1 year ago at time of URS (2017)  History of prostate cancer S/p open radical prostatectomy 2013 PSA 09/2016 0.01, stable  SUI/ urinary urgency Rare leakage Minimal bother Doing well on Toviaz for urinary frequency    PMH: Past Medical History:  Diagnosis Date  . Cancer [facility])    Prostate  . Cancer ([place])    Melanoma  . Hypertension   . Kidney stones   . 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 BLADDER SURGERY  2012   Dr. Madelin [facility]ings, [facility]  . BRAIN SURGERY Left 2005   Brian Tumor (stem), [facility]  . CHOLECYSTECTOMY    . CYSTOSCOPY WITH RETROGRADE URETHROGRAM  03/16/2016   Procedure: CYSTOSCOPY WITH RETROGRADE URETHROGRAM;  Surgeon: Hollice Espy, MD;  Location: [facility] ORS;  Service: Urology;;  . Consuela Mimes WITH STENT PLACEMENT Left 02/23/2016   Procedure: CYSTOSCOPY WITH STENT PLACEMENT;  Surgeon: Dereck Leep, MD;  Location: [facility] ORS;  Service: Urology;  Laterality: Left;    . CYSTOSCOPY WITH STENT PLACEMENT Left 03/16/2016   Procedure: CYSTOSCOPY WITH STENT PLACEMENT/EXCHANGE;  Surgeon: Hollice Espy, MD;  Location: [facility] ORS;  Service: Urology;  Laterality: Left;  Marland Kitchen MELANOMA EXCISION    . PROSTATECTOMY  2003   Dr. Madelin Headings, [facility]  . STONE EXTRACTION WITH BASKET  03/16/2016   Procedure: STONE EXTRACTION WITH BASKET;  Surgeon: Hollice Espy, MD;  Location: [facility] ORS;  Service: Urology;;    Home Medications:  Allergies as of 04/23/2017      Reactions   Iodine Anaphylaxis   Shellfish Allergy Anaphylaxis      Medication List       Accurate as of 04/23/17  9:58 AM. Always use your most recent med list.          atenolol 25 MG tablet Commonly known as:  TENORMIN Take 25 mg by mouth daily.   fesoterodine 8 MG Tb24 tablet Commonly known as:  TOVIAZ Take 1 tablet (8 mg total) by mouth daily.   hydroxypropyl methylcellulose / hypromellose 2.5 % ophthalmic solution Commonly known as:  ISOPTO TEARS / GONIOVISC Place 1 drop into both eyes as needed for dry eyes.   losartan-hydrochlorothiazide 100-25 MG tablet Commonly known as:  HYZAAR Take 1 tablet by mouth daily.       Allergies:  Allergies  Allergen Reactions  . Iodine Anaphylaxis  . Shellfish Allergy Anaphylaxis    Family History: No family history on file.  Social History:  reports that he has never smoked. He has never  used smokeless tobacco. He reports that he does not drink alcohol or use drugs.  ROS: UROLOGY Frequent Urination?: No Hard to postpone urination?: No Burning/pain with urination?: No Get up at night to urinate?: No Leakage of urine?: No Urine stream starts and stops?: No Trouble starting stream?: No Do you have to strain to urinate?: No Blood in urine?: No Urinary tract infection?: No Sexually transmitted disease?: No Injury to kidneys or bladder?: No Painful intercourse?: No Weak stream?: No Erection problems?: No Penile pain?: No  Gastrointestinal Nausea?:  No Vomiting?: No Indigestion/heartburn?: No Diarrhea?: No Constipation?: No  Constitutional Fever: No Night sweats?: No Weight loss?: No Fatigue?: No  Skin Skin rash/lesions?: No Itching?: No  Eyes Blurred vision?: No Double vision?: No  Ears/Nose/Throat Sore throat?: No Sinus problems?: No  Hematologic/Lymphatic Swollen glands?: No Easy bruising?: No  Cardiovascular Leg swelling?: No Chest pain?: No  Respiratory Cough?: No Shortness of breath?: No  Endocrine Excessive thirst?: No  Musculoskeletal Back pain?: No Joint pain?: No  Neurological Headaches?: No Dizziness?: No  Psychologic Depression?: No Anxiety?: No  Physical Exam: BP (!) 170/79   Pulse 81   Ht 5\' 8"  (1.727 m)   Wt 209 lb (94.8 kg)   BMI 31.78 kg/m   Constitutional:  Alert and oriented, No acute distress. HEENT: [place] AT, moist mucus membranes.  Trachea midline, no masses. Cardiovascular: No clubbing, cyanosis, or edema. Respiratory: Normal respiratory effort, no increased work of breathing. GI: Abdomen is soft, nontender, nondistended, no abdominal masses GU: No CVA tenderness.  Skin: No rashes, bruises or suspicious lesions. Neurologic: Grossly intact, no focal deficits, moving all 4 extremities. Psychiatric: Normal mood and affect.  Laboratory Data: Lab Results  Component Value Date   WBC 11.6 (H) 06/05/2016   HGB 14.8 06/05/2016   HCT 43.4 06/05/2016   MCV 87.4 06/05/2016   PLT 243 06/05/2016   Cr 1.2 on 04/14/17  Urinalysis UA reviewed today, no evidence of microscopic blood, unremarkable. 1+ protein.  Pertinent Imaging: KUB pending  Assessment & Plan:    1. History of kidney stones KUB today pending to assess over stone burden- will call with results No stone episodes over the past year  2. OAB (overactive bladder) - fesoterodine (TOVIAZ) 8 MG TB24 tablet; Take 1 tablet (8 mg total) by mouth daily.  Dispense: 90 each; Refill: 3 Doing well on Toviaz  3.  History of prostate cancer Check annually by PCP, due 09/2017  4. History of benign neoplasm of bladder Patient wishes to abstain from further cysto given pathology and no recurrence x 5 years Last cysto 02/2016 NED No microscopic blood today  5. SUI (stress urinary incontinence), male Minimal bother  Return in about 1 year (around 04/23/2018) for recheck .  Hollice Espy, MD  [facility] [address] [place], [place] [address] [phone]

## 2017-04-23 NOTE — Telephone Encounter (Signed)
LMOM

## 2017-07-14 DIAGNOSIS — Z85820 Personal history of malignant melanoma of skin: Secondary | ICD-10-CM | POA: Diagnosis not present

## 2017-07-14 DIAGNOSIS — Z85828 Personal history of other malignant neoplasm of skin: Secondary | ICD-10-CM | POA: Diagnosis not present

## 2017-07-14 DIAGNOSIS — L821 Other seborrheic keratosis: Secondary | ICD-10-CM | POA: Diagnosis not present

## 2017-07-14 DIAGNOSIS — D485 Neoplasm of uncertain behavior of skin: Secondary | ICD-10-CM | POA: Diagnosis not present

## 2017-07-14 DIAGNOSIS — Z1283 Encounter for screening for malignant neoplasm of skin: Secondary | ICD-10-CM | POA: Diagnosis not present

## 2017-07-14 DIAGNOSIS — L570 Actinic keratosis: Secondary | ICD-10-CM | POA: Diagnosis not present

## 2017-07-14 DIAGNOSIS — L820 Inflamed seborrheic keratosis: Secondary | ICD-10-CM | POA: Diagnosis not present

## 2017-07-14 DIAGNOSIS — D1800 Hemangioma unspecified site: Secondary | ICD-10-CM | POA: Diagnosis not present

## 2017-07-14 DIAGNOSIS — L578 Other skin changes due to chronic exposure to nonionizing radiation: Secondary | ICD-10-CM | POA: Diagnosis not present

## 2017-09-10 DIAGNOSIS — Z23 Encounter for immunization: Secondary | ICD-10-CM | POA: Diagnosis not present

## 2017-10-06 ENCOUNTER — Encounter
Admission: EM | Disposition: A | Discharge status: Home / Self Care | Payer: Self-pay | Source: Home / Self Care | Attending: Internal Medicine

## 2017-10-06 ENCOUNTER — Telehealth: Payer: Self-pay | Admitting: *Deleted

## 2017-10-06 ENCOUNTER — Encounter: Payer: Self-pay | Admitting: Internal Medicine

## 2017-10-06 ENCOUNTER — Emergency Department: Payer: Medicare HMO

## 2017-10-06 ENCOUNTER — Inpatient Hospital Stay (HOSPITAL_COMMUNITY)
Admit: 2017-10-06 | Discharge: 2017-10-06 | Disposition: A | Discharge status: Home / Self Care | Payer: Medicare HMO | Attending: Internal Medicine | Admitting: Internal Medicine

## 2017-10-06 ENCOUNTER — Inpatient Hospital Stay
Admission: EM | Admit: 2017-10-06 | Discharge: 2017-10-07 | DRG: 282 | Disposition: A | Discharge status: Home / Self Care | Payer: Medicare HMO | Attending: Internal Medicine | Admitting: Internal Medicine

## 2017-10-06 DIAGNOSIS — I77810 Thoracic aortic ectasia: Secondary | ICD-10-CM

## 2017-10-06 DIAGNOSIS — I10 Essential (primary) hypertension: Secondary | ICD-10-CM | POA: Diagnosis present

## 2017-10-06 DIAGNOSIS — Z8546 Personal history of malignant neoplasm of prostate: Secondary | ICD-10-CM | POA: Diagnosis not present

## 2017-10-06 DIAGNOSIS — E782 Mixed hyperlipidemia: Secondary | ICD-10-CM

## 2017-10-06 DIAGNOSIS — Z91041 Radiographic dye allergy status: Secondary | ICD-10-CM

## 2017-10-06 DIAGNOSIS — R739 Hyperglycemia, unspecified: Secondary | ICD-10-CM | POA: Diagnosis present

## 2017-10-06 DIAGNOSIS — C61 Malignant neoplasm of prostate: Secondary | ICD-10-CM | POA: Diagnosis not present

## 2017-10-06 DIAGNOSIS — Z6831 Body mass index (BMI) 31.0-31.9, adult: Secondary | ICD-10-CM

## 2017-10-06 DIAGNOSIS — R42 Dizziness and giddiness: Secondary | ICD-10-CM | POA: Diagnosis present

## 2017-10-06 DIAGNOSIS — Z91013 Allergy to seafood: Secondary | ICD-10-CM

## 2017-10-06 DIAGNOSIS — R079 Chest pain, unspecified: Secondary | ICD-10-CM | POA: Diagnosis not present

## 2017-10-06 DIAGNOSIS — I25110 Atherosclerotic heart disease of native coronary artery with unstable angina pectoris: Secondary | ICD-10-CM

## 2017-10-06 DIAGNOSIS — Z79899 Other long term (current) drug therapy: Secondary | ICD-10-CM | POA: Diagnosis not present

## 2017-10-06 DIAGNOSIS — D72829 Elevated white blood cell count, unspecified: Secondary | ICD-10-CM | POA: Diagnosis not present

## 2017-10-06 DIAGNOSIS — I5030 Unspecified diastolic (congestive) heart failure: Secondary | ICD-10-CM | POA: Diagnosis not present

## 2017-10-06 DIAGNOSIS — Z85820 Personal history of malignant melanoma of skin: Secondary | ICD-10-CM

## 2017-10-06 DIAGNOSIS — I493 Ventricular premature depolarization: Secondary | ICD-10-CM | POA: Diagnosis present

## 2017-10-06 DIAGNOSIS — I2510 Atherosclerotic heart disease of native coronary artery without angina pectoris: Secondary | ICD-10-CM

## 2017-10-06 DIAGNOSIS — I440 Atrioventricular block, first degree: Secondary | ICD-10-CM | POA: Diagnosis present

## 2017-10-06 DIAGNOSIS — R0789 Other chest pain: Secondary | ICD-10-CM | POA: Diagnosis not present

## 2017-10-06 DIAGNOSIS — E785 Hyperlipidemia, unspecified: Secondary | ICD-10-CM | POA: Diagnosis not present

## 2017-10-06 DIAGNOSIS — E876 Hypokalemia: Secondary | ICD-10-CM | POA: Diagnosis not present

## 2017-10-06 DIAGNOSIS — I5189 Other ill-defined heart diseases: Secondary | ICD-10-CM

## 2017-10-06 DIAGNOSIS — I200 Unstable angina: Secondary | ICD-10-CM | POA: Diagnosis not present

## 2017-10-06 DIAGNOSIS — I214 Non-ST elevation (NSTEMI) myocardial infarction: Secondary | ICD-10-CM | POA: Diagnosis not present

## 2017-10-06 DIAGNOSIS — I249 Acute ischemic heart disease, unspecified: Secondary | ICD-10-CM | POA: Diagnosis not present

## 2017-10-06 HISTORY — DX: Other ill-defined heart diseases: I51.89

## 2017-10-06 HISTORY — DX: Thoracic aortic ectasia: I77.810

## 2017-10-06 HISTORY — DX: Atherosclerotic heart disease of native coronary artery without angina pectoris: I25.10

## 2017-10-06 HISTORY — PX: LEFT HEART CATH AND CORONARY ANGIOGRAPHY: CATH118249

## 2017-10-06 LAB — PROTIME-INR
INR: 1.03
PROTHROMBIN TIME: 13.4 s (ref 11.4–15.2)

## 2017-10-06 LAB — CBC
HCT: 44 % (ref 40.0–52.0)
HEMOGLOBIN: 15.3 g/dL (ref 13.0–18.0)
MCH: 31.2 pg (ref 26.0–34.0)
MCHC: 34.8 g/dL (ref 32.0–36.0)
MCV: 89.6 fL (ref 80.0–100.0)
Platelets: 229 10*3/uL (ref 150–440)
RBC: 4.91 MIL/uL (ref 4.40–5.90)
RDW: 14.3 % (ref 11.5–14.5)
WBC: 10.9 10*3/uL — AB (ref 3.8–10.6)

## 2017-10-06 LAB — TSH: TSH: 0.66 u[IU]/mL (ref 0.350–4.500)

## 2017-10-06 LAB — LIPID PANEL
Cholesterol: 167 mg/dL (ref 0–200)
HDL: 37 mg/dL — ABNORMAL LOW (ref >40)
LDL CALC: 104 mg/dL — AB (ref 0–99)
Total CHOL/HDL Ratio: 4.5 RATIO
Triglycerides: 128 mg/dL (ref <150)
VLDL: 26 mg/dL (ref 0–40)

## 2017-10-06 LAB — COMPREHENSIVE METABOLIC PANEL
ALBUMIN: 3.9 g/dL (ref 3.5–5.0)
ALT: 20 U/L (ref 17–63)
ANION GAP: 9 (ref 5–15)
AST: 24 U/L (ref 15–41)
Alkaline Phosphatase: 58 U/L (ref 38–126)
BUN: 27 mg/dL — AB (ref 6–20)
CHLORIDE: 103 mmol/L (ref 101–111)
CO2: 28 mmol/L (ref 22–32)
Calcium: 9.3 mg/dL (ref 8.9–10.3)
Creatinine, Ser: 1.28 mg/dL — ABNORMAL HIGH (ref 0.61–1.24)
GFR calc Af Amer: >60 mL/min (ref >60)
GFR calc non Af Amer: 53 mL/min — ABNORMAL LOW (ref >60)
Glucose, Bld: 132 mg/dL — ABNORMAL HIGH (ref 65–99)
POTASSIUM: 3.1 mmol/L — AB (ref 3.5–5.1)
Sodium: 140 mmol/L (ref 135–145)
TOTAL PROTEIN: 7.6 g/dL (ref 6.5–8.1)
Total Bilirubin: 0.6 mg/dL (ref 0.3–1.2)

## 2017-10-06 LAB — BASIC METABOLIC PANEL
Anion gap: 7 (ref 5–15)
BUN: 26 mg/dL — AB (ref 6–20)
CALCIUM: 9 mg/dL (ref 8.9–10.3)
CHLORIDE: 103 mmol/L (ref 101–111)
CO2: 29 mmol/L (ref 22–32)
CREATININE: 1.17 mg/dL (ref 0.61–1.24)
GFR calc Af Amer: >60 mL/min (ref >60)
GFR calc non Af Amer: 60 mL/min — ABNORMAL LOW (ref >60)
Glucose, Bld: 120 mg/dL — ABNORMAL HIGH (ref 65–99)
Potassium: 4.1 mmol/L (ref 3.5–5.1)
Sodium: 139 mmol/L (ref 135–145)

## 2017-10-06 LAB — CBC WITH DIFFERENTIAL/PLATELET
BASOS ABS: 0.1 10*3/uL (ref 0–0.1)
BASOS PCT: 1 %
EOS PCT: 2 %
Eosinophils Absolute: 0.2 10*3/uL (ref 0–0.7)
HCT: 46.7 % (ref 40.0–52.0)
Hemoglobin: 15.8 g/dL (ref 13.0–18.0)
Lymphocytes Relative: 27 %
Lymphs Abs: 3.3 10*3/uL (ref 1.0–3.6)
MCH: 30.5 pg (ref 26.0–34.0)
MCHC: 33.9 g/dL (ref 32.0–36.0)
MCV: 89.8 fL (ref 80.0–100.0)
MONO ABS: 1 10*3/uL (ref 0.2–1.0)
MONOS PCT: 8 %
Neutro Abs: 7.8 10*3/uL — ABNORMAL HIGH (ref 1.4–6.5)
Neutrophils Relative %: 62 %
PLATELETS: 231 10*3/uL (ref 150–440)
RBC: 5.2 MIL/uL (ref 4.40–5.90)
RDW: 14.7 % — AB (ref 11.5–14.5)
WBC: 12.4 10*3/uL — ABNORMAL HIGH (ref 3.8–10.6)

## 2017-10-06 LAB — TROPONIN I
TROPONIN I: 0.36 ng/mL — AB (ref <0.03)
Troponin I: 1.98 ng/mL (ref <0.03)

## 2017-10-06 LAB — HEPARIN LEVEL (UNFRACTIONATED): Heparin Unfractionated: <0.1 IU/mL — ABNORMAL LOW (ref 0.30–0.70)

## 2017-10-06 LAB — APTT: APTT: 32 s (ref 24–36)

## 2017-10-06 LAB — MAGNESIUM: Magnesium: 2.2 mg/dL (ref 1.7–2.4)

## 2017-10-06 SURGERY — LEFT HEART CATH AND CORONARY ANGIOGRAPHY
Anesthesia: Moderate Sedation

## 2017-10-06 MED ORDER — CLOPIDOGREL BISULFATE 75 MG PO TABS
300.0000 mg | ORAL_TABLET | Freq: Once | ORAL | Status: AC
  Administered 2017-10-06: 300 mg via ORAL
  Filled 2017-10-06: qty 4

## 2017-10-06 MED ORDER — ASPIRIN 81 MG PO TBEC: 81.0000 mg | DELAYED_RELEASE_TABLET | Freq: Every day | ORAL | 0 refills | Status: DC

## 2017-10-06 MED ORDER — SODIUM CHLORIDE 0.9% FLUSH
3.0000 mL | Freq: Two times a day (BID) | INTRAVENOUS | Status: DC
  Administered 2017-10-06 – 2017-10-07 (×3): 3 mL via INTRAVENOUS

## 2017-10-06 MED ORDER — HEPARIN (PORCINE) IN NACL 100-0.45 UNIT/ML-% IJ SOLN
1150.0000 [IU]/h | INTRAMUSCULAR | Status: DC
  Administered 2017-10-06: 1150 [IU]/h via INTRAVENOUS
  Filled 2017-10-06: qty 250

## 2017-10-06 MED ORDER — SODIUM CHLORIDE 0.9 % WEIGHT BASED INFUSION
3.0000 mL/kg/h | INTRAVENOUS | Status: DC
  Administered 2017-10-06: 3 mL/kg/h via INTRAVENOUS

## 2017-10-06 MED ORDER — NITROGLYCERIN 2 % TD OINT
1.0000 [in_us] | TOPICAL_OINTMENT | Freq: Once | TRANSDERMAL | Status: AC
  Administered 2017-10-06: 1 [in_us] via TOPICAL
  Filled 2017-10-06: qty 1

## 2017-10-06 MED ORDER — FENTANYL CITRATE (PF) 100 MCG/2ML IJ SOLN
INTRAMUSCULAR | Status: DC | PRN
  Administered 2017-10-06: 50 ug via INTRAVENOUS

## 2017-10-06 MED ORDER — ACETAMINOPHEN 325 MG PO TABS
650.0000 mg | ORAL_TABLET | ORAL | Status: DC | PRN
  Administered 2017-10-06 (×2): 650 mg via ORAL
  Filled 2017-10-06 (×2): qty 2

## 2017-10-06 MED ORDER — LOSARTAN POTASSIUM 50 MG PO TABS
100.0000 mg | ORAL_TABLET | Freq: Every day | ORAL | Status: DC
  Administered 2017-10-07: 100 mg via ORAL
  Filled 2017-10-06 (×2): qty 2

## 2017-10-06 MED ORDER — SODIUM CHLORIDE 0.9% FLUSH: 3.0000 mL | INTRAVENOUS | Status: DC | PRN

## 2017-10-06 MED ORDER — NITROGLYCERIN 2 % TD OINT
1.0000 [in_us] | TOPICAL_OINTMENT | Freq: Four times a day (QID) | TRANSDERMAL | Status: DC
  Administered 2017-10-06: 1 [in_us] via TOPICAL
  Filled 2017-10-06: qty 1

## 2017-10-06 MED ORDER — ASPIRIN 300 MG RE SUPP: 300.0000 mg | RECTAL | Status: AC

## 2017-10-06 MED ORDER — MIDAZOLAM HCL 2 MG/2ML IJ SOLN
INTRAMUSCULAR | Status: DC | PRN
  Administered 2017-10-06: 1 mg via INTRAVENOUS

## 2017-10-06 MED ORDER — IOPAMIDOL (ISOVUE-300) INJECTION 61%
INTRAVENOUS | Status: DC | PRN
  Administered 2017-10-06: 100 mL via INTRA_ARTERIAL

## 2017-10-06 MED ORDER — SODIUM CHLORIDE 0.9% FLUSH: 3.0000 mL | Freq: Two times a day (BID) | INTRAVENOUS | Status: DC

## 2017-10-06 MED ORDER — ASPIRIN 81 MG PO CHEW
324.0000 mg | CHEWABLE_TABLET | Freq: Once | ORAL | Status: AC
  Administered 2017-10-06: 324 mg via ORAL
  Filled 2017-10-06: qty 4

## 2017-10-06 MED ORDER — NITROGLYCERIN 0.4 MG SL SUBL
SUBLINGUAL_TABLET | SUBLINGUAL | Status: AC
  Administered 2017-10-06: 0.4 mg via SUBLINGUAL
  Filled 2017-10-06: qty 3

## 2017-10-06 MED ORDER — MIDAZOLAM HCL 2 MG/2ML IJ SOLN
INTRAMUSCULAR | Status: AC
  Filled 2017-10-06: qty 2

## 2017-10-06 MED ORDER — DIPHENHYDRAMINE HCL 25 MG PO CAPS
50.0000 mg | ORAL_CAPSULE | Freq: Once | ORAL | Status: AC
  Administered 2017-10-06: 50 mg via ORAL
  Filled 2017-10-06: qty 2

## 2017-10-06 MED ORDER — LOSARTAN POTASSIUM-HCTZ 100-25 MG PO TABS: 1.0000 | ORAL_TABLET | Freq: Every day | ORAL | Status: DC

## 2017-10-06 MED ORDER — HEPARIN BOLUS VIA INFUSION
4000.0000 [IU] | Freq: Once | INTRAVENOUS | Status: AC
  Administered 2017-10-06: 4000 [IU] via INTRAVENOUS
  Filled 2017-10-06: qty 4000

## 2017-10-06 MED ORDER — NITROGLYCERIN 0.4 MG SL SUBL: 0.4000 mg | SUBLINGUAL_TABLET | SUBLINGUAL | Status: DC | PRN

## 2017-10-06 MED ORDER — POTASSIUM CHLORIDE CRYS ER 20 MEQ PO TBCR
EXTENDED_RELEASE_TABLET | ORAL | Status: AC
  Filled 2017-10-06: qty 2

## 2017-10-06 MED ORDER — HYDROCHLOROTHIAZIDE 25 MG PO TABS
25.0000 mg | ORAL_TABLET | Freq: Every day | ORAL | Status: DC
  Administered 2017-10-07: 25 mg via ORAL
  Filled 2017-10-06 (×2): qty 1

## 2017-10-06 MED ORDER — ASPIRIN 81 MG PO CHEW
324.0000 mg | CHEWABLE_TABLET | ORAL | Status: AC
  Administered 2017-10-06: 324 mg via ORAL
  Filled 2017-10-06: qty 4

## 2017-10-06 MED ORDER — DIPHENHYDRAMINE HCL 50 MG/ML IJ SOLN: 50.0000 mg | Freq: Once | INTRAMUSCULAR | Status: AC

## 2017-10-06 MED ORDER — HYDRALAZINE HCL 20 MG/ML IJ SOLN
10.0000 mg | INTRAMUSCULAR | Status: DC | PRN
  Administered 2017-10-06: 10 mg via INTRAVENOUS
  Filled 2017-10-06: qty 1

## 2017-10-06 MED ORDER — ATORVASTATIN CALCIUM 40 MG PO TABS: 40.0000 mg | ORAL_TABLET | Freq: Every day | ORAL | 0 refills | Status: DC

## 2017-10-06 MED ORDER — ASPIRIN 81 MG PO CHEW
CHEWABLE_TABLET | ORAL | Status: AC
  Filled 2017-10-06: qty 4

## 2017-10-06 MED ORDER — ATORVASTATIN CALCIUM 20 MG PO TABS
40.0000 mg | ORAL_TABLET | Freq: Every day | ORAL | Status: DC
  Administered 2017-10-06: 40 mg via ORAL
  Filled 2017-10-06: qty 2

## 2017-10-06 MED ORDER — MORPHINE SULFATE (PF) 2 MG/ML IV SOLN
2.0000 mg | INTRAVENOUS | Status: DC | PRN
Start: 2017-10-06 — End: 2017-10-06

## 2017-10-06 MED ORDER — POTASSIUM CHLORIDE CRYS ER 20 MEQ PO TBCR
40.0000 meq | EXTENDED_RELEASE_TABLET | Freq: Once | ORAL | Status: AC
  Administered 2017-10-06: 40 meq via ORAL

## 2017-10-06 MED ORDER — FENTANYL CITRATE (PF) 100 MCG/2ML IJ SOLN
INTRAMUSCULAR | Status: AC
  Filled 2017-10-06: qty 2

## 2017-10-06 MED ORDER — METHYLPREDNISOLONE SODIUM SUCC 40 MG IJ SOLR
40.0000 mg | INTRAMUSCULAR | Status: DC
  Administered 2017-10-06: 40 mg via INTRAVENOUS
  Filled 2017-10-06: qty 1

## 2017-10-06 MED ORDER — SODIUM CHLORIDE 0.9 % IV SOLN: 250.0000 mL | INTRAVENOUS | Status: DC | PRN

## 2017-10-06 MED ORDER — ASPIRIN EC 81 MG PO TBEC
81.0000 mg | DELAYED_RELEASE_TABLET | Freq: Every day | ORAL | Status: DC
  Administered 2017-10-07: 81 mg via ORAL
  Filled 2017-10-06: qty 1

## 2017-10-06 MED ORDER — METOPROLOL TARTRATE 25 MG PO TABS
25.0000 mg | ORAL_TABLET | Freq: Two times a day (BID) | ORAL | Status: DC
  Administered 2017-10-06 – 2017-10-07 (×4): 25 mg via ORAL
  Filled 2017-10-06 (×4): qty 1

## 2017-10-06 MED ORDER — SODIUM CHLORIDE 0.9 % WEIGHT BASED INFUSION: 1.0000 mL/kg/h | INTRAVENOUS | Status: DC

## 2017-10-06 MED ORDER — NITROGLYCERIN 0.4 MG SL SUBL
0.4000 mg | SUBLINGUAL_TABLET | SUBLINGUAL | Status: DC | PRN
  Administered 2017-10-06 (×2): 0.4 mg via SUBLINGUAL
  Filled 2017-10-06: qty 1

## 2017-10-06 MED ORDER — CLOPIDOGREL BISULFATE 75 MG PO TABS: 75.0000 mg | ORAL_TABLET | Freq: Every day | ORAL | 0 refills | Status: DC

## 2017-10-06 MED ORDER — ONDANSETRON HCL 4 MG/2ML IJ SOLN: 4.0000 mg | Freq: Four times a day (QID) | INTRAMUSCULAR | Status: DC | PRN

## 2017-10-06 MED ORDER — HEPARIN (PORCINE) IN NACL 2-0.9 UNIT/ML-% IJ SOLN
INTRAMUSCULAR | Status: AC
  Filled 2017-10-06: qty 500

## 2017-10-06 MED ORDER — ENOXAPARIN SODIUM 40 MG/0.4ML ~~LOC~~ SOLN: 40.0000 mg | SUBCUTANEOUS | Status: DC

## 2017-10-06 MED ORDER — ONDANSETRON HCL 4 MG/2ML IJ SOLN
4.0000 mg | Freq: Once | INTRAMUSCULAR | Status: AC
  Administered 2017-10-06: 4 mg via INTRAVENOUS
  Filled 2017-10-06: qty 2

## 2017-10-06 MED ORDER — MORPHINE SULFATE (PF) 2 MG/ML IV SOLN
2.0000 mg | Freq: Once | INTRAVENOUS | Status: AC
  Administered 2017-10-06: 2 mg via INTRAVENOUS
  Filled 2017-10-06: qty 1

## 2017-10-06 SURGICAL SUPPLY — 9 items
CATH INFINITI 5FR ANG PIGTAIL (CATHETERS) ×2 IMPLANT
CATH INFINITI 5FR JL4 (CATHETERS) ×2 IMPLANT
CATH INFINITI JR4 5F (CATHETERS) ×2 IMPLANT
DEVICE CLOSURE MYNXGRIP 5F (Vascular Products) ×2 IMPLANT
KIT MANI 3VAL PERCEP (MISCELLANEOUS) ×2 IMPLANT
NEEDLE PERC 18GX7CM (NEEDLE) ×2 IMPLANT
PACK CARDIAC CATH (CUSTOM PROCEDURE TRAY) ×2 IMPLANT
SHEATH AVANTI 5FR X 11CM (SHEATH) ×2 IMPLANT
WIRE EMERALD 3MM-J .035X150CM (WIRE) ×2 IMPLANT

## 2017-10-06 NOTE — Progress Notes (Signed)
[facility] [place] NAME: Dilraj Killgore    MR#:  [id number]  DATE OF BIRTH:  1941-02-20  SUBJECTIVE:  CHIEF COMPLAINT:   Chief Complaint  Patient presents with  . Chest Pain   No CP at this time  REVIEW OF SYSTEMS:    Review of Systems  Constitutional: Positive for malaise/fatigue. Negative for chills and fever.  HENT: Negative for sore throat.   Eyes: Negative for blurred vision, double vision and pain.  Respiratory: Negative for cough, hemoptysis, shortness of breath and wheezing.   Cardiovascular: Negative for chest pain, palpitations, orthopnea and leg swelling.  Gastrointestinal: Negative for abdominal pain, constipation, diarrhea, heartburn, nausea and vomiting.  Genitourinary: Negative for dysuria and hematuria.  Musculoskeletal: Negative for back pain and joint pain.  Skin: Negative for rash.  Neurological: Negative for sensory change, speech change, focal weakness and headaches.  Endo/Heme/Allergies: Does not bruise/bleed easily.  Psychiatric/Behavioral: Negative for depression. The patient is not nervous/anxious.     DRUG ALLERGIES:   Allergies  Allergen Reactions  . Iodine Anaphylaxis  . Shellfish Allergy Anaphylaxis    VITALS:  Blood pressure 125/86, pulse 66, temperature 98.1 F (36.7 C), temperature source Oral, resp. rate 15, height 5\' 8"  (1.727 m), weight 93 kg (205 lb), SpO2 93 %.  PHYSICAL EXAMINATION:   Physical Exam  GENERAL:  76 y.o.-year-old patient lying in the bed with no acute distress.  EYES: Pupils equal, round, reactive to light and accommodation. No scleral icterus. Extraocular muscles intact.  HEENT: Head atraumatic, normocephalic. Oropharynx and nasopharynx clear.  LUNGS: Normal breath sounds bilaterally CARDIOVASCULAR: S1, S2 normal. No murmurs, rubs, or gallops.  EXTREMITIES: No cyanosis, clubbing or edema b/l.    PSYCHIATRIC: The patient is alert and oriented x 3.   LABORATORY  PANEL:   CBC  Recent Labs Lab 10/06/17 0600  WBC 10.9*  HGB 15.3  HCT 44.0  PLT 229   ------------------------------------------------------------------------------------------------------------------ Chemistries   Recent Labs Lab 10/06/17 0015 10/06/17 0600 10/06/17 1350  NA 140 139  --   K 3.1* 4.1  --   CL 103 103  --   CO2 28 29  --   GLUCOSE 132* 120*  --   BUN 27* 26*  --   CREATININE 1.28* 1.17  --   CALCIUM 9.3 9.0  --   MG  --   --  2.2  AST 24  --   --   ALT 20  --   --   ALKPHOS 58  --   --   BILITOT 0.6  --   --    ------------------------------------------------------------------------------------------------------------------  Cardiac Enzymes  Recent Labs Lab 10/06/17 1350  TROPONINI 1.98*   ------------------------------------------------------------------------------------------------------------------  RADIOLOGY:  Dg Chest 2 View  Result Date: 10/06/2017 CLINICAL DATA:  Chest pain since this evening.  Mild dyspnea. EXAM: CHEST  2 VIEW COMPARISON:  11/11/2009 FINDINGS: The heart size and mediastinal contours are within normal limits. Mild diffuse interstitial prominence without alveolar consolidation, effusion or pulmonary edema. The visualized skeletal structures are unremarkable. IMPRESSION: Chronic mild interstitial prominence.  No acute pulmonary disease. Electronically Signed   By: Ashley Royalty M.D.   On: 10/06/2017 01:25     ASSESSMENT AND PLAN:   * NSTEMI Acutely rising troponin. He did have typical chest pain. Cancel stress test Start heparin Cardiac cath. Discussed with Dr. Rockey Situ Critically ill ON ASA, Statin, Metoprolol  * HTN ON metoprolol, HCTZ, Lisinopril  All the records are reviewed and  case discussed with Care Management/Social Workerr. Management plans discussed with the patient, family and they are in agreement.  CODE STATUS: FULL CODE  DVT Prophylaxis: SCDs  TOTAL CC TIME TAKING CARE OF THIS PATIENT: 35  minutes.   POSSIBLE D/C IN 1-2 DAYS, DEPENDING ON CLINICAL CONDITION.  Hillary Bow R M.D on 10/06/2017 at 7:40 PM  Between 7am to 6pm - Pager - [phone]  After 6pm go to [email or web address] - password [email or web address] [facility]  Office  [phone]  CC: Primary care physician; Tracie Harrier, MD  Note: This dictation was prepared with Dragon dictation along with smaller phrase technology. Any transcriptional errors that result from this process are unintentional.

## 2017-10-06 NOTE — Telephone Encounter (Signed)
-----   Message from Blain Pais sent at 10/06/2017  3:03 PM EDT ----- Regarding: tcm/ph 10/26 3pm Dr Rockey Situ

## 2017-10-06 NOTE — Consult Note (Signed)
Cardiology Consultation Note  Patient ID: Gregory [name], MRN: [id number], DOB/AGE: 07-21-1941 76 y.o. Admit date: 10/06/2017   Date of Consult: 10/06/2017 Primary Physician: Tracie Harrier, MD Primary Cardiologist: New to [facility] Requesting Physician: Dr. Estanislado Pandy, MD  Chief Complaint: Chest pain Reason for Consult: NSTEMI  HPI: Gregory [name] is a 76 y.o. male who is being seen today for the evaluation of NSTEMI at the request of Dr. Estanislado Pandy, MD. Patient has a h/o prostate cancer s/p resection, melanoma, HTN, nephrolithiasis, and obesity who presented to [facility] with substernal chest pain and ruled in for NSTEMI this morning.   No previously known cardiac history. No prior ischemic workups. Patient was in his usual state of health until 9:30 PM on 10/05/17 when he developed sudden onset of substernal chest pain that radiated to his left arm with associated SOB. Pain lasted ~ 3-4 hours and began to improve with ASA and nitro. Has never had pain like this before. No exertional symptoms leading up to his presentation. 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 He was never a smoker and denies any known diabetes. His father had an MI at age 58.   Upon the patient's arrival to [facility] they were found to have BP 210/96, HR 86, temp 98.1, oxygen saturation 93% on room air, weight 205 pounds. EKG not acute as below, CXR showed no acute cardiopulmonary process. Labs showed troponin <0.03-->0.36, SCr 1.28-->1.17, K+ 3.1-->4.1, glucose 132, WBC 12.4-->10.9, HGB 15.8, PLT 231, LDL 104. In the ED he was given ASA and nitropaste was applied. He was started on heparin gtt. He does continue to note mild substernal chest ache this morning, though significantly improved from the prior night.   Past Medical History:  Diagnosis Date  . Cancer [facility])    Prostate  . Cancer ([place])    Melanoma  . Hypertension   . Kidney stones   . Loss of equilibrium    left side only due to benign brain  stem tumor in 2005      Most Recent Cardiac Studies: none   Surgical History:  Past Surgical History:  Procedure Laterality Date  . BLADDER SURGERY  2012   Dr. Madelin Headings, [facility]  . BRAIN SURGERY Left 2005   Brian Tumor (stem), [facility]  . CHOLECYSTECTOMY    . CYSTOSCOPY WITH RETROGRADE URETHROGRAM  03/16/2016   Procedure: CYSTOSCOPY WITH RETROGRADE URETHROGRAM;  Surgeon: Hollice Espy, MD;  Location: [facility] ORS;  Service: Urology;;  . Consuela Mimes WITH STENT PLACEMENT Left 02/23/2016   Procedure: CYSTOSCOPY WITH STENT PLACEMENT;  Surgeon: Dereck Leep, MD;  Location: [facility] ORS;  Service: Urology;  Laterality: Left;  . CYSTOSCOPY WITH STENT PLACEMENT Left 03/16/2016   Procedure: CYSTOSCOPY WITH STENT PLACEMENT/EXCHANGE;  Surgeon: Hollice Espy, MD;  Location: [facility] ORS;  Service: Urology;  Laterality: Left;  Marland Kitchen MELANOMA EXCISION    . PROSTATECTOMY  2003   [facility]. Madelin Headings, [facility]  . STONE EXTRACTION WITH BASKET  03/16/2016   Procedure: STONE EXTRACTION WITH BASKET;  Surgeon: Hollice Espy, MD;  Location: [facility] ORS;  Service: Urology;;     Home Meds: Prior to Admission medications   Medication Sig Start Date End Date Taking? Authorizing Provider  atenolol (TENORMIN) 25 MG tablet Take 25 mg by mouth daily.   Yes [provider]  fesoterodine (TOVIAZ) 8 MG TB24 tablet Take 1 tablet (8 mg total) by mouth daily. 04/23/17  Yes Hollice Espy, MD  losartan-hydrochlorothiazide (HYZAAR) 100-25 MG tablet Take 1  tablet by mouth daily.    Yes [provider]  hydroxypropyl methylcellulose / hypromellose (ISOPTO TEARS / GONIOVISC) 2.5 % ophthalmic solution Place 1 drop into both eyes as needed for dry eyes.    [provider]    Inpatient Medications:  . [START ON 10/07/2017] aspirin EC  81 mg Oral Daily  . atorvastatin  40 mg Oral q1800  . losartan  100 mg Oral Daily   And  . hydrochlorothiazide  25 mg Oral Daily  . metoprolol tartrate  25 mg Oral BID  .  nitroGLYCERIN  1 inch Topical Q6H  . sodium chloride flush  3 mL Intravenous Q12H   . sodium chloride    . heparin 1,150 Units/hr (10/06/17 0757)    Allergies:  Allergies  Allergen Reactions  . Iodine Anaphylaxis  . Shellfish Allergy Anaphylaxis    Social History   Social History  . Marital status: Married    Spouse name: N/A  . Number of children: N/A  . Years of education: N/A   Occupational History  . part time in [place] Topics  . Smoking status: Never Smoker  . Smokeless tobacco: Never Used  . Alcohol use No  . Drug use: No  . Sexual activity: Not on file   Other Topics Concern  . Not on file   Social History Narrative  . No narrative on file     Family History  Problem Relation Age of Onset  . CAD Father   . Polycystic kidney disease Father   . Diabetes Mellitus II Neg Hx   . Hypertension Neg Hx      Review of Systems: Review of Systems  Constitutional: Positive for malaise/fatigue. Negative for chills, diaphoresis, fever and weight loss.  HENT: Negative for congestion.   Eyes: Negative for discharge and redness.  Respiratory: Positive for shortness of breath. Negative for cough, hemoptysis, sputum production and wheezing.   Cardiovascular: Positive for chest pain. Negative for palpitations, orthopnea, claudication, leg swelling and PND.  Gastrointestinal: Negative for abdominal pain, blood in stool, heartburn, melena, nausea and vomiting.  Genitourinary: Negative for hematuria.  Musculoskeletal: Negative for falls and myalgias.  Skin: Negative for rash.  Neurological: Positive for weakness. Negative for dizziness, tingling, tremors, sensory change, speech change, focal weakness and loss of consciousness.  Endo/Heme/Allergies: Does not bruise/bleed easily.  Psychiatric/Behavioral: Negative for substance abuse. The patient is not nervous/anxious.   All other systems reviewed and are  negative.   Labs:  Recent Labs  10/06/17 0015 10/06/17 0600  TROPONINI <0.03 0.36*   Lab Results  Component Value Date   WBC 10.9 (H) 10/06/2017   HGB 15.3 10/06/2017   HCT 44.0 10/06/2017   MCV 89.6 10/06/2017   PLT 229 10/06/2017     Recent Labs Lab 10/06/17 0015 10/06/17 0600  NA 140 139  K 3.1* 4.1  CL 103 103  CO2 28 29  BUN 27* 26*  CREATININE 1.28* 1.17  CALCIUM 9.3 9.0  PROT 7.6  --   BILITOT 0.6  --   ALKPHOS 58  --   ALT 20  --   AST 24  --   GLUCOSE 132* 120*   Lab Results  Component Value Date   CHOL 167 10/06/2017   HDL 37 (L) 10/06/2017   LDLCALC 104 (H) 10/06/2017   TRIG 128 10/06/2017   No results found for: DDIMER  Radiology/Studies:  Dg Chest 2 View  Result Date: 10/06/2017 IMPRESSION:  Chronic mild interstitial prominence.  No acute pulmonary disease. Electronically Signed   By: Ashley Royalty M.D.   On: 10/06/2017 01:25    EKG: Interpreted by me showed: NSR, 88 bpm, 1st degree AV block, occasional PVCs, poor R wave progression, no acute st/t changes  Telemetry: Interpreted by me showed: sinus bradycardia, upper 50s bpm  Weights: Filed Weights   10/06/17 0008  Weight: 205 lb (93 kg)     Physical Exam: Blood pressure (!) 153/88, pulse 86, temperature (!) 97.5 F (36.4 C), temperature source Oral, resp. rate 18, height 5\' 8"  (1.727 m), weight 205 lb (93 kg), SpO2 97 %. Body mass index is 31.17 kg/m. General: Well developed, well nourished, in no acute distress. Head: Normocephalic, atraumatic, sclera non-icteric, no xanthomas, nares are without discharge.  Neck: Negative for carotid bruits. JVD not elevated. Lungs: Clear bilaterally to auscultation without wheezes, rales, or rhonchi. Breathing is unlabored. Heart: RRR with S1 S2. No murmurs, rubs, or gallops appreciated. Abdomen: Soft, non-tender, non-distended with normoactive bowel sounds. No hepatomegaly. No rebound/guarding. No obvious abdominal masses. Msk:  Strength and tone  appear normal for age. Extremities: No clubbing or cyanosis. No edema. Distal pedal pulses are 2+ and equal bilaterally. Neuro: Alert and oriented X 3. No facial asymmetry. No focal deficit. Moves all extremities spontaneously. Psych:  Responds to questions appropriately with a normal affect.    Assessment and Plan:  Principal Problem:   Non-ST elevation (NSTEMI) myocardial infarction [facility]) Active Problems:   Essential hypertension   HLD (hyperlipidemia)   Hyperglycemia   Leukocytosis    1. NSTEMI: -Continues to have a dull substernal ache -Troponin this morning 0.36, continue to cycle every 6 hours until peak -Heparin gtt -ASA -Patient was loaded with Plavix 300 mg x 1 overnight by IM -Nitropaste  -SL NTG prn -Morphine prn -Cardiac cath today -Patient has a contrast allergy and will be pre-medicated per protocol this morning prior to his procedure at 12:30 PM -Echo pending -Hold starting beta blocker given sinus bradycardia in the upper 50s bpm -Lipitor Risks and benefits of cardiac catheterization have been discussed with the patient including risks of bleeding, bruising, infection, kidney damage, stroke, heart attack, and death. The patient understands these risks and is willing to proceed with the procedure. All questions have been answered and concerns listened to  2. HTN: -Continue PTA medications with the exception of metoprolol as above  3. Hyperglycemia: -Check A1c  4. HLD: -Goal LDL < 70 -Lipitor  5. Leukocytosis: -No signs of infection -Likely inflammatory 2/2 #1   6. Hypokalemia: -Repleted to goal > 4.0 -Check magnesium, replete to goal > 2.0  7. Sinus bradycardia: -Likely related to #1 -Check TSH   Signed, Gregory Faith, PA-C [place] Pager: [phone] 10/06/2017, 8:29 AM

## 2017-10-06 NOTE — Progress Notes (Signed)
Awaiting to hear back form MD regarding patient dispo. Troponin trended up to 1.98 at 13:50. LHC with med Rx.

## 2017-10-06 NOTE — Discharge Instructions (Addendum)
Heart healthy diet

## 2017-10-06 NOTE — H&P (Signed)
[facility] [place] NAME: Gregory [name]    MR#:  [id number]  DATE OF BIRTH:  September 27, 1941  DATE OF ADMISSION:  10/06/2017  PRIMARY CARE PHYSICIAN: Tracie Harrier, MD   REQUESTING/REFERRING PHYSICIAN:   CHIEF COMPLAINT:   Chief Complaint  Patient presents with  . Chest Pain    HISTORY OF PRESENT ILLNESS: Gregory [name]  is a 76 y.o. male with a known history of prostate cancer, melanoma, hypertension presented to the emergency room with chest pain. Patient noticed this chest pressure around 9:30 PM last night. Patient was watching television when he experienced the chest pressure. It was pressure-like sensation in the left side of the chest. No radiation of the pressure. Patient says it is 6 out of 10 on a scale of 1-10. He was evaluated in the emergency room EKG normal sinus rhythm with no ST segment changes. First set of troponin was negative. Blood pressure was elevated and patient presented to the emergency room he was given nitrates and blood pressure was controlled.  PAST MEDICAL HISTORY:   Past Medical History:  Diagnosis Date  . Cancer [facility])    Prostate  . Cancer ([place])    Melanoma  . Hypertension   . Kidney stones   . 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 BLADDER SURGERY  2012   Dr. Madelin Headings, [facility]  . BRAIN SURGERY Left 2005   Brian Tumor (stem), [facility]  . CHOLECYSTECTOMY    . CYSTOSCOPY WITH RETROGRADE URETHROGRAM  03/16/2016   Procedure: CYSTOSCOPY WITH RETROGRADE URETHROGRAM;  Surgeon: Hollice Espy, MD;  Location: [facility] ORS;  Service: Urology;;  . Consuela Mimes WITH STENT PLACEMENT Left 02/23/2016   Procedure: CYSTOSCOPY WITH STENT PLACEMENT;  Surgeon: Dereck Leep, MD;  Location: [facility] ORS;  Service: Urology;  Laterality: Left;  . CYSTOSCOPY WITH STENT PLACEMENT Left  03/16/2016   Procedure: CYSTOSCOPY WITH STENT PLACEMENT/EXCHANGE;  Surgeon: Hollice Espy, MD;  Location: [facility] ORS;  Service: Urology;  Laterality: Left;  Marland Kitchen MELANOMA EXCISION    . PROSTATECTOMY  2003   Dr. Madelin Headings, [facility]  . STONE EXTRACTION WITH BASKET  03/16/2016   Procedure: STONE EXTRACTION WITH BASKET;  Surgeon: Hollice Espy, MD;  Location: [facility] ORS;  Service: Urology;;    SOCIAL HISTORY:  Social History  Substance Use Topics  . Smoking status: Never Smoker  . Smokeless tobacco: Never Used  . Alcohol use No    FAMILY HISTORY:  Family History  Problem Relation Age of Onset  . Diabetes Mellitus II Neg Hx   . Hypertension Neg Hx   . CAD Neg Hx     DRUG ALLERGIES:  Allergies  Allergen Reactions  . Iodine Anaphylaxis  . Shellfish Allergy Anaphylaxis    REVIEW OF SYSTEMS:   CONSTITUTIONAL: No fever, fatigue or weakness.  EYES: No blurred or double vision.  EARS, NOSE, AND THROAT: No tinnitus or ear pain.  RESPIRATORY: No cough, shortness of breath, wheezing or hemoptysis.  CARDIOVASCULAR: has chest pain,  No orthopnea, edema.  GASTROINTESTINAL: No nausea, vomiting, diarrhea or abdominal pain.  GENITOURINARY: No dysuria, hematuria.  ENDOCRINE: No polyuria, nocturia,  HEMATOLOGY: No anemia, easy bruising or bleeding SKIN: No rash or lesion. MUSCULOSKELETAL: No joint pain or arthritis.   NEUROLOGIC: No tingling, numbness, weakness.  PSYCHIATRY: No anxiety or depression.   MEDICATIONS  AT HOME:  Prior to Admission medications   Medication Sig Start Date End Date Taking? Authorizing Provider  atenolol (TENORMIN) 25 MG tablet Take 25 mg by mouth daily.    [provider]  fesoterodine (TOVIAZ) 8 MG TB24 tablet Take 1 tablet (8 mg total) by mouth daily. 04/23/17   Hollice Espy, MD  hydroxypropyl methylcellulose / hypromellose (ISOPTO TEARS / GONIOVISC) 2.5 % ophthalmic solution Place 1 drop into both eyes as needed for dry eyes.    [provider]   losartan-hydrochlorothiazide (HYZAAR) 100-25 MG tablet Take 1 tablet by mouth daily.     [provider]      PHYSICAL EXAMINATION:   VITAL SIGNS: Blood pressure (!) 210/96, pulse 86, temperature 98.1 F (36.7 C), temperature source Oral, resp. rate 20, height 5\' 8"  (1.727 m), weight 93 kg (205 lb), SpO2 93 %.  GENERAL:  76 y.o.-year-old well built male patient lying in the bed with no acute distress.  EYES: Pupils equal, round, reactive to light and accommodation. No scleral icterus. Extraocular muscles intact.  HEENT: Head atraumatic, normocephalic. Oropharynx and nasopharynx clear.  NECK:  Supple, no jugular venous distention. No thyroid enlargement, no tenderness.  LUNGS: Normal breath sounds bilaterally, no wheezing, rales,rhonchi or crepitation. No use of accessory muscles of respiration.  CARDIOVASCULAR: S1, S2 normal. No murmurs, rubs, or gallops.  ABDOMEN: Soft, nontender, nondistended. Bowel sounds present. No organomegaly or mass.  EXTREMITIES: No pedal edema, cyanosis, or clubbing.  NEUROLOGIC: Cranial nerves II through XII are intact. Muscle strength 5/5 in all extremities. Sensation intact. Gait not checked.  PSYCHIATRIC: The patient is alert and oriented x 3.  SKIN: No obvious rash, lesion, or ulcer.   LABORATORY PANEL:   CBC  Recent Labs Lab 10/06/17 0015  WBC 12.4*  HGB 15.8  HCT 46.7  PLT 231  MCV 89.8  MCH 30.5  MCHC 33.9  RDW 14.7*  LYMPHSABS 3.3  MONOABS 1.0  EOSABS 0.2  BASOSABS 0.1   ------------------------------------------------------------------------------------------------------------------  Chemistries   Recent Labs Lab 10/06/17 0015  NA 140  K 3.1*  CL 103  CO2 28  GLUCOSE 132*  BUN 27*  CREATININE 1.28*  CALCIUM 9.3  AST 24  ALT 20  ALKPHOS 58  BILITOT 0.6   ------------------------------------------------------------------------------------------------------------------ estimated creatinine clearance is 56  mL/min (A) (by C-G formula based on SCr of 1.28 mg/dL (H)). ------------------------------------------------------------------------------------------------------------------ No results for input(s): TSH, T4TOTAL, T3FREE, THYROIDAB in the last 72 hours.  Invalid input(s): FREET3   Coagulation profile No results for input(s): INR, PROTIME in the last 168 hours. ------------------------------------------------------------------------------------------------------------------- No results for input(s): DDIMER in the last 72 hours. -------------------------------------------------------------------------------------------------------------------  Cardiac Enzymes  Recent Labs Lab 10/06/17 0015  TROPONINI <0.03   ------------------------------------------------------------------------------------------------------------------ Invalid input(s): POCBNP  ---------------------------------------------------------------------------------------------------------------  Urinalysis    Component Value Date/Time   COLORURINE YELLOW (A) 06/06/2016 0300   APPEARANCEUR Clear 04/23/2017 0936   LABSPEC 1.014 06/06/2016 0300   LABSPEC 1.026 02/14/2014 1933   PHURINE 5.0 06/06/2016 0300   GLUCOSEU Negative 04/23/2017 0936   GLUCOSEU see comment 02/14/2014 1933   HGBUR 1+ (A) 06/06/2016 0300   BILIRUBINUR Negative 04/23/2017 0936   BILIRUBINUR see comment 02/14/2014 1933   KETONESUR NEGATIVE 06/06/2016 0300   PROTEINUR 1+ (A) 04/23/2017 0936   PROTEINUR NEGATIVE 06/06/2016 0300   NITRITE Negative 04/23/2017 0936   NITRITE NEGATIVE 06/06/2016 0300   LEUKOCYTESUR Negative 04/23/2017 0936   LEUKOCYTESUR see comment 02/14/2014 1933     RADIOLOGY: Dg Chest 2 View  Result  Date: 10/06/2017 CLINICAL DATA:  Chest pain since this evening.  Mild dyspnea. EXAM: CHEST  2 VIEW COMPARISON:  11/11/2009 FINDINGS: The heart size and mediastinal contours are within normal limits. Mild diffuse interstitial  prominence without alveolar consolidation, effusion or pulmonary edema. The visualized skeletal structures are unremarkable. IMPRESSION: Chronic mild interstitial prominence.  No acute pulmonary disease. Electronically Signed   By: Ashley Royalty M.D.   On: 10/06/2017 01:25    EKG: Orders placed or performed during the hospital encounter of 10/06/17  . EKG 12-Lead  . EKG 12-Lead    IMPRESSION AND PLAN: 76 year old male patient with history of prostate cancer, melanoma presented to the emergency room with chest pain and elevated blood pressure.  Admitting diagnosis 1. Unstable angina 2. Uncontrolled hypertension 3. Hypokalemia 4.history of prostate cancer Treatment plan Admit patient to telemetry observation bed Aspirin 81 mg daily Control blood pressure with oral beta blocker, when necessary IV hydralazine Replace potassium Check echocardiogram and cycle troponin Cardiac stress test Cardiology consult DVT prophylaxis with subcutaneous Lovenox  All the records are reviewed and case discussed with ED provider. Management plans discussed with the patient, family and they are in agreement.  CODE STATUS:FULL CODE Surrogate decision maker : spouse Code Status History    Date Active Date Inactive Code Status Order ID Comments User Context   06/05/2016 10:11 PM 06/06/2016  3:50 PM Full Code [id number]  Alesia Richards, MD ED       TOTAL TIME TAKING CARE OF THIS PATIENT: 50 minutes.    Saundra Shelling M.D on 10/06/2017 at 2:09 AM  Between 7am to 6pm - Pager - [phone]  After 6pm go to [email or web address] - password [email or web address] [facility]  Office  [phone]  CC: Primary care physician; Tracie Harrier, MD

## 2017-10-06 NOTE — ED Triage Notes (Signed)
Pt in with co chest pain since tonight, states no cardiac hx. Does co of some shob, took otc antacid without relief.

## 2017-10-06 NOTE — Progress Notes (Signed)
Cardiac catheterization results Occluded PDA, likely culprit for non-STEMI Currently chest pain-free Several other regions of at least moderate disease including OM1, PL branch, diagonal #2 Mild to moderate disease of LAD  Would push aggressive lipid management, check hemoglobin A1c Aspirin and Plavix Continue losartan and beta blocker Given troponin continues to trend upwards, would keep overnight, It order for cardiac rehabilitation Ambulate in the morning, repeat troponin in a.m.  Signed, Esmond Plants, MD, Ph.D [facility]

## 2017-10-06 NOTE — Telephone Encounter (Signed)
Admitted

## 2017-10-06 NOTE — Progress Notes (Signed)
ANTICOAGULATION CONSULT NOTE - Initial Consult  Pharmacy Consult for heparin drip Indication: chest pain/ACS  Allergies  Allergen Reactions  . Iodine Anaphylaxis  . Shellfish Allergy Anaphylaxis    Patient Measurements: Height: 5\' 8"  (172.7 cm) Weight: 205 lb (93 kg) IBW/kg (Calculated) : 68.4 Heparin Dosing Weight: 88 kg  Vital Signs: Temp: 97.5 F (36.4 C) (10/17 0318) Temp Source: Oral (10/17 0318) BP: 153/88 (10/17 0315) Pulse Rate: 86 (10/17 0315)  Labs:  Recent Labs  10/06/17 0015 10/06/17 0600  HGB 15.8 15.3  HCT 46.7 44.0  PLT 231 229  CREATININE 1.28* 1.17  TROPONINI <0.03 0.36*    Estimated Creatinine Clearance: 61.3 mL/min (by C-G formula based on SCr of 1.17 mg/dL).   Medical History: Past Medical History:  Diagnosis Date  . Cancer [facility])    Prostate  . Cancer ([place])    Melanoma  . Hypertension   . Kidney stones   . 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  Assessment:  Goal of Therapy:  Heparin level 0.3-0.7 units/ml Monitor platelets by anticoagulation protocol: Yes   Plan:  4000 unit bolus and initial rate of 1150 units/hr. First heparin level 8 hours after start of infusion.  Serenidy Waltz S 10/06/2017,7:26 AM

## 2017-10-06 NOTE — ED Provider Notes (Signed)
[facility] Emergency Department Provider Note   ____________________________________________   First MD Initiated Contact with Patient 10/06/17 0037     (approximate)  I have reviewed the triage vital signs and the nursing notes.   HISTORY  Chief Complaint Chest Pain   HPI Gregory [name] is a 76 y.o. male who is into the hospital today with some chest pressure and pain. He reports he was watching TV when it started about 9:30. He reports that the pain is in his mid chest and it goes down his left arm. He has no nausea or vomiting but has had some shortness of breath. The patient also denies any dizziness. He has never had anything like this in the past and rates his pain a 4-5 out of 10 in intensity. He denies taking any medications for coming in but did take some Mylanta because he thought this was due to indigestion. The patient is here today for evaluation of his chest pain symptoms. He did take his blood pressure medicines today.   Past Medical History:  Diagnosis Date  . Cancer [facility])    Prostate  . Cancer ([place])    Melanoma  . Hypertension   . Kidney stones   . 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 Chest pain 10/06/2017  . Syncope 06/05/2016  . Calculus of kidney 04/20/2016  . Acoustic neuroma ([place]) 03/24/2016  . Allergic state 03/24/2016  . BP (high blood pressure) 03/24/2016  . Left ureteral stone 02/22/2016  . Kidney stone   . Left flank pain   . Malignant neoplasm of prostate ([place]) 03/04/2015  . H/O Malignant melanoma 03/04/2015    Past Surgical History:  Procedure Laterality Date  . BLADDER SURGERY  2012   Dr. Madelin Headings, [facility]  . BRAIN SURGERY Left 2005   Brian Tumor (stem), [facility]  . CHOLECYSTECTOMY    . CYSTOSCOPY WITH RETROGRADE URETHROGRAM  03/16/2016   Procedure: CYSTOSCOPY WITH RETROGRADE URETHROGRAM;  Surgeon:  Hollice Espy, MD;  Location: [facility] ORS;  Service: Urology;;  . Consuela Mimes WITH STENT PLACEMENT Left 02/23/2016   Procedure: CYSTOSCOPY WITH STENT PLACEMENT;  Surgeon: Dereck Leep, MD;  Location: [facility] ORS;  Service: Urology;  Laterality: Left;  . CYSTOSCOPY WITH STENT PLACEMENT Left 03/16/2016   Procedure: CYSTOSCOPY WITH STENT PLACEMENT/EXCHANGE;  Surgeon: Hollice Espy, MD;  Location: [facility] ORS;  Service: Urology;  Laterality: Left;  Marland Kitchen MELANOMA EXCISION    . PROSTATECTOMY  2003   Dr. Madelin Headings, [facility]  . STONE EXTRACTION WITH BASKET  03/16/2016   Procedure: STONE EXTRACTION WITH BASKET;  Surgeon: Hollice Espy, MD;  Location: [facility] ORS;  Service: Urology;;    Prior to Admission medications   Medication Sig Start Date End Date Taking? Authorizing Provider  atenolol (TENORMIN) 25 MG tablet Take 25 mg by mouth daily.   Yes [provider]  fesoterodine (TOVIAZ) 8 MG TB24 tablet Take 1 tablet (8 mg total) by mouth daily. 04/23/17  Yes Hollice Espy, MD  losartan-hydrochlorothiazide (HYZAAR) 100-25 MG tablet Take 1 tablet by mouth daily.    Yes [provider]  hydroxypropyl methylcellulose / hypromellose (ISOPTO TEARS / GONIOVISC) 2.5 % ophthalmic solution Place 1 drop into both eyes as needed for dry eyes.    [provider]    Allergies Iodine and Shellfish allergy  Family History  Problem Relation  Age of Onset  . Diabetes Mellitus II Neg Hx   . Hypertension Neg Hx   . CAD Neg Hx     Social History Social History  Substance Use Topics  . Smoking status: Never Smoker  . Smokeless tobacco: Never Used  . Alcohol use No    Review of Systems  Constitutional: No fever/chills Eyes: No visual changes. ENT: No sore throat. Cardiovascular: chest pain. Respiratory:  shortness of breath. Gastrointestinal: No abdominal pain.  No nausea, no vomiting.  No diarrhea.  No constipation. Genitourinary: Negative for dysuria. Musculoskeletal: Negative for back  pain. Skin: Negative for rash. Neurological: Negative for headaches, focal weakness or numbness.   ____________________________________________   PHYSICAL EXAM:  VITAL SIGNS: ED Triage Vitals  Enc Vitals Group     BP 10/06/17 0011 (!) 210/96     Pulse Rate 10/06/17 0011 86     Resp 10/06/17 0011 20     Temp 10/06/17 0011 98.1 F (36.7 C)     Temp Source 10/06/17 0011 Oral     SpO2 10/06/17 0011 93 %     Weight 10/06/17 0008 205 lb (93 kg)     Height 10/06/17 0008 5\' 8"  (1.727 m)     Head Circumference --      Peak Flow --      Pain Score 10/06/17 0008 6     Pain Loc --      Pain Edu? --      Excl. in [place]? --     Constitutional: Alert and oriented. Well appearing and in moderate distress. Eyes: Conjunctivae are normal. PERRL. EOMI. Head: Atraumatic. Nose: No congestion/rhinnorhea. Mouth/Throat: Mucous membranes are moist.  Oropharynx non-erythematous. Cardiovascular: Normal rate, regular rhythm. Grossly normal heart sounds.  Good peripheral circulation. Respiratory: Normal respiratory effort.  No retractions. Lungs CTAB. Gastrointestinal: Soft and nontender. No distention. positive bowel sounds Musculoskeletal: No lower extremity tenderness nor edema.  Neurologic:  Normal speech and language.  Skin:  Skin is warm, dry and intact.  Psychiatric: Mood and affect are normal.   ____________________________________________   LABS (all labs ordered are listed, but only abnormal results are displayed)  Labs Reviewed  CBC WITH DIFFERENTIAL/PLATELET - Abnormal; Notable for the following:       Result Value   WBC 12.4 (*)    RDW 14.7 (*)    Neutro Abs 7.8 (*)    All other components within normal limits  COMPREHENSIVE METABOLIC PANEL - Abnormal; Notable for the following:    Potassium 3.1 (*)    Glucose, Bld 132 (*)    BUN 27 (*)    Creatinine, Ser 1.28 (*)    GFR calc non Af Amer 53 (*)    All other components within normal limits  TROPONIN I - Abnormal; Notable for  the following:    Troponin I 0.36 (*)    All other components within normal limits  BASIC METABOLIC PANEL - Abnormal; Notable for the following:    Glucose, Bld 120 (*)    BUN 26 (*)    GFR calc non Af Amer 60 (*)    All other components within normal limits  LIPID PANEL - Abnormal; Notable for the following:    HDL 37 (*)    LDL Cholesterol 104 (*)    All other components within normal limits  CBC - Abnormal; Notable for the following:    WBC 10.9 (*)    All other components within normal limits  TROPONIN I  TROPONIN I  APTT  PROTIME-INR   ____________________________________________  EKG  ED ECG REPORT I, Loney Hering, the attending physician, personally viewed and interpreted this ECG.   Date: 10/06/2017  EKG Time: 0005  Rate: 88  Rhythm: normal sinus rhythm with some PVCs  Axis: normal  Intervals:none  ST&T Change: none  ____________________________________________  RADIOLOGY  Dg Chest 2 View  Result Date: 10/06/2017 CLINICAL DATA:  Chest pain since this evening.  Mild dyspnea. EXAM: CHEST  2 VIEW COMPARISON:  11/11/2009 FINDINGS: The heart size and mediastinal contours are within normal limits. Mild diffuse interstitial prominence without alveolar consolidation, effusion or pulmonary edema. The visualized skeletal structures are unremarkable. IMPRESSION: Chronic mild interstitial prominence.  No acute pulmonary disease. Electronically Signed   By: Ashley Royalty M.D.   On: 10/06/2017 01:25    ____________________________________________   PROCEDURES  Procedure(s) performed: None  Procedures  Critical Care performed: No  ____________________________________________   INITIAL IMPRESSION / ASSESSMENT AND PLAN / ED COURSE  As part of my medical decision making, I reviewed the following data within the electronic MEDICAL RECORD NUMBER Notes from prior ED visits and [place] Controlled Substance Database   This is a 76 year old male who comes into the hospital  today with some chest pain. The patient's blood pressure was significantly elevated when he arrived. He also was holding his chest and some significant discomfort.  The differential diagnosis includes acute coronary syndrome, pneumonia, reflux, ST segment elevation MI, non-ST segment elevation MI.  We did send some blood work on the patient when he arrived. Given his pain in his elbow the blood pressure I did give the patient 2 nitroglycerin sublingually as well as some Aspirin. The patient's pain did improve but he started complaining of some jaw discomfort. I then gave the patient had Nitropaste to his chest as well as some morphine. I will admit the patient to the hospitalist service for further evaluation of his chest pain. The patient has no further complaints at this time.      ____________________________________________   FINAL CLINICAL IMPRESSION(S) / ED DIAGNOSES  Final diagnoses:  Chest pain, unspecified type  Acute coronary syndrome ([place])      NEW MEDICATIONS STARTED DURING THIS VISIT:  Current Discharge Medication List       Note:  This document was prepared using Dragon voice recognition software and may include unintentional dictation errors.    Loney Hering, MD 10/06/17 [phone]

## 2017-10-07 LAB — TROPONIN I
Troponin I: 3 ng/mL (ref <0.03)
Troponin I: 2.92 ng/mL (ref <0.03)

## 2017-10-07 LAB — BASIC METABOLIC PANEL
Anion gap: 7 (ref 5–15)
BUN: 27 mg/dL — AB (ref 6–20)
CHLORIDE: 105 mmol/L (ref 101–111)
CO2: 27 mmol/L (ref 22–32)
Calcium: 8.7 mg/dL — ABNORMAL LOW (ref 8.9–10.3)
Creatinine, Ser: 1.24 mg/dL (ref 0.61–1.24)
GFR calc Af Amer: >60 mL/min (ref >60)
GFR calc non Af Amer: 56 mL/min — ABNORMAL LOW (ref >60)
GLUCOSE: 112 mg/dL — AB (ref 65–99)
POTASSIUM: 4.4 mmol/L (ref 3.5–5.1)
Sodium: 139 mmol/L (ref 135–145)

## 2017-10-07 LAB — ECHOCARDIOGRAM COMPLETE
HEIGHTINCHES: 68 in
WEIGHTICAEL: 3280 [oz_av]

## 2017-10-07 MED ORDER — NITROGLYCERIN 0.4 MG SL SUBL
0.4000 mg | SUBLINGUAL_TABLET | SUBLINGUAL | 0 refills | Status: AC | PRN
Start: 2017-10-07 — End: ?

## 2017-10-07 MED ORDER — CLOPIDOGREL BISULFATE 75 MG PO TABS
75.0000 mg | ORAL_TABLET | Freq: Every day | ORAL | Status: DC
  Administered 2017-10-07: 75 mg via ORAL
  Filled 2017-10-07: qty 1

## 2017-10-07 NOTE — Plan of Care (Signed)
Problem: Food- and Nutrition-Related Knowledge Deficit (NB-1.1) Goal: Nutrition education Formal process to instruct or train a patient/client in a skill or to impart knowledge to help patients/clients voluntarily manage or modify food choices and eating behavior to maintain or improve health.  Outcome: Adequate for Discharge Nutrition Education Note  RD consulted for nutrition education regarding a Heart Healthy diet.   Patient with new NSTEMI. Reports normally eating a bowl of cereal for breakfast, at a restaurant for lunch, and more salad based meals for dinner. He reports that his PCP for many years talked about moderation of sodium and fat. Tries to avoid eating heavy, greasy meals often, red meat, and burgers. Will often get a meat and two vegetable plate for lunch. Normally cooks in canola oil. Does not add salt to food when cooking or at the table. Discussed monitoring lunch meals and daily sodium needs.  Lipid Panel     Component Value Date/Time   CHOL 167 10/06/2017 0600   TRIG 128 10/06/2017 0600   HDL 37 (L) 10/06/2017 0600   CHOLHDL 4.5 10/06/2017 0600   VLDL 26 10/06/2017 0600   LDLCALC 104 (H) 10/06/2017 0600    RD provided "Heart Healthy Nutrition Therapy" handout from the Academy of [employer]etics. Reviewed patient's dietary recall. Provided examples on ways to decrease sodium and fat intake in diet. Discouraged intake of processed foods and use of salt shaker. Encouraged fresh fruits and vegetables as well as whole grain sources of carbohydrates to maximize fiber intake. Teach back method used.  Expect fair compliance.  Body mass index is 31.17 kg/m. Pt meets criteria for obese class I based on current BMI.  Current diet order is heart healthy, patient is consuming approximately 90-100% of meals at this time. Labs and medications reviewed. No further nutrition interventions warranted at this time. RD contact information provided. If additional nutrition issues  arise, please re-consult RD.  Satira Anis. Chayne Baumgart, MS, RD LDN Inpatient Clinical Dietitian Pager [phone]

## 2017-10-07 NOTE — Progress Notes (Signed)
Progress Note  Patient Name: Gregory [name] Date of Encounter: 10/07/2017  Primary Cardiologist: New to [facility] - consult by [place]  Subjective   No chest pain or SOB. Status post LHC 10/06/16 for NSTEMI that showed occluded PDA with moderate LAD, OM1, PL branch, and D2 disease. Medically managed. Echo pending. Troponin continues to trend upwards, 3 at 4:15 this morning. Post cath renal function stable. Potassium at goal. Vitals stable. Has not ambulated yet.   Inpatient Medications    Scheduled Meds: . aspirin EC  81 mg Oral Daily  . atorvastatin  40 mg Oral q1800  . losartan  100 mg Oral Daily   And  . hydrochlorothiazide  25 mg Oral Daily  . metoprolol tartrate  25 mg Oral BID  . sodium chloride flush  3 mL Intravenous Q12H   Continuous Infusions: . sodium chloride     PRN Meds: sodium chloride, acetaminophen, hydrALAZINE, nitroGLYCERIN, ondansetron (ZOFRAN) IV, sodium chloride flush   Vital Signs    Vitals:   10/06/17 1400 10/06/17 1415 10/06/17 2004 10/07/17 0632  BP: 133/69 125/86 131/75 129/75  Pulse: 62 66 93 63  Resp: 12 15 18 18   Temp:    97.6 F (36.4 C)  TempSrc:    Oral  SpO2: 93% 93% 93% 96%  Weight:      Height:        Intake/Output Summary (Last 24 hours) at 10/07/17 0716 Last data filed at 10/07/17 0226  Gross per 24 hour  Intake           464.08 ml  Output                0 ml  Net           464.08 ml   Filed Weights   10/06/17 0008 10/06/17 1158  Weight: 205 lb (93 kg) 205 lb (93 kg)    Telemetry    NSR - Personally Reviewed  ECG    n/a - Personally Reviewed  Physical Exam   GEN: No acute distress.   Neck: No JVD Cardiac: RRR, no murmurs, rubs, or gallops. Right femoral cardiac cath site without bleeding, bruising, swelling, erythema, or TTP. No bruit.  Respiratory: Clear to auscultation bilaterally. GI: Soft, nontender, non-distended  MS: No edema; No deformity. Neuro:  Nonfocal  Psych: Normal affect   Labs      Chemistry Recent Labs Lab 10/06/17 0015 10/06/17 0600 10/07/17 0415  NA 140 139 139  K 3.1* 4.1 4.4  CL 103 103 105  CO2 28 29 27   GLUCOSE 132* 120* 112*  BUN 27* 26* 27*  CREATININE 1.28* 1.17 1.24  CALCIUM 9.3 9.0 8.7*  PROT 7.6  --   --   ALBUMIN 3.9  --   --   AST 24  --   --   ALT 20  --   --   ALKPHOS 58  --   --   BILITOT 0.6  --   --   GFRNONAA 53* 60* 56*  GFRAA >60 >60 >60  ANIONGAP 9 7 7      Hematology Recent Labs Lab 10/06/17 0015 10/06/17 0600  WBC 12.4* 10.9*  RBC 5.20 4.91  HGB 15.8 15.3  HCT 46.7 44.0  MCV 89.8 89.6  MCH 30.5 31.2  MCHC 33.9 34.8  RDW 14.7* 14.3  PLT 231 229    Cardiac Enzymes Recent Labs Lab 10/06/17 0015 10/06/17 0600 10/06/17 1350 10/07/17 0415  TROPONINI <0.03 0.36* 1.98* 3.00*  No results for input(s): TROPIPOC in the last 168 hours.   BNPNo results for input(s): BNP, PROBNP in the last 168 hours.   DDimer No results for input(s): DDIMER in the last 168 hours.   Radiology    Dg Chest 2 View  Result Date: 10/06/2017 CLINICAL DATA:  Chest pain since this evening.  Mild dyspnea. EXAM: CHEST  2 VIEW COMPARISON:  11/11/2009 FINDINGS: The heart size and mediastinal contours are within normal limits. Mild diffuse interstitial prominence without alveolar consolidation, effusion or pulmonary edema. The visualized skeletal structures are unremarkable. IMPRESSION: Chronic mild interstitial prominence.  No acute pulmonary disease. Electronically Signed   By: Ashley Royalty M.D.   On: 10/06/2017 01:25    Cardiac Studies   LHC 10/06/2017: Procedural Findings:  Coronary angiography:  Coronary dominance: Right  Left mainstem: Large vessel that bifurcates into the LAD and left circumflex, no significant disease noted  Left anterior descending (LAD): Large vessel that extends to the apical region, diagonal branch 2 of moderate size, 40% proximal disease, 40% mid to distal disease, diagonal #1 is a small diffusely diseased  vessel , 50% ostial/proximal diagonal #2 disease Collaterals from distal LAD to right, occluded PDA  Left circumflex (LCx): Moderate size vessel with OM branch 2, high OM has moderate proximal disease estimated at 60%, long region  Right coronary artery (RCA): Right dominant vessel with PL and PDA, PDA is occluded at ostium,  PL branch with moderate to severe proximal disease   Left ventriculography: Left ventricular systolic function is normal, LVEF is estimated at 55%, there is no significant mitral regurgitation , no significant aortic valve stenosis  Final Conclusions:  Occluded PDA at the ostium, small vessel Moderate disease of OM1 proximal region, medical management recommended Moderate to severe disease of proximal PL branch, medical management recommended Moderate disease of ostial/proximal diagonal branch #2, medical management recommended Mild diffuse disease of LAD.  Recommendations:  Case discussed with Dr. Gerald Stabs End, moderate diffuse disease in multiple vessels. Occluded PDA has collaterals, diffusely diseased likely with thrombus. Likely of little benefit in intervention on PDA. Aggressive medical measures recommended    TTE pending.  Patient Profile     76 y.o. male prostate cancer s/p resection, melanoma, HTN, nephrolithiasis, and obesity who presented to [facility] on 10/17 with a NSTEMI.   Assessment & Plan    1. NSTEMI: -Found to have an occluded PDA with at least mild to moderate LAD, D2, OM1, and PL branch disease -Medically managed -Troponin up to 3 this morning at 4:15, continue to cycle until peak -DAPT with ASA 81 mg daily and Plavix 75 mg daily -Lipitor, losartan, metoprolol -Cardiac rehab -Echo pending -Ambulate this morning  2. HTN: -Controlled -Continue current medications  3. HLD: -Lipitor -Will need follow up lipid and LFT in 8 weeks  4. Hypokalemia: -Repleted  5. Sinus bradycardia: -Improved  -TSH normal -Tolerating metoprolol     For questions or updates, please contact [facility] Please consult [email or web address] for contact info under Cardiology/STEMI.      Signed, Christell Faith, PA-C  10/07/2017, 7:16 AM

## 2017-10-07 NOTE — Telephone Encounter (Signed)
Admitted

## 2017-10-07 NOTE — Progress Notes (Signed)
Patient ambulated around nurse station per dr sudini request. Patient tolerated well, no c/o pain no abnormal telemetry readings, per md patient okay for discharge

## 2017-10-07 NOTE — Progress Notes (Signed)
Patient referred to Cardiac Rehab with dx of NSTEMI.  Patient underwent cardiac catheterization yesterday, which revealed an occluded PDA thought to be the culprit of NSTEMI.  Several other regions of moderate disease including OM1, PL branch, diagonal #2 and mild to moderate disease of the LAD.  Plan per Dr. Rockey Situ is to medically manage.  Echo revealed normal EF of 55-60%.  Patient to be on the following medications:  ASA, Plavix, Lipitor, Losartan, Metoprolol.    *Briefly reviewed the coronary arteries with patient and wife.  Discussed patients CAD and blocked PDA.  "Bouncing Back after Heart Attack" book was given and reviewed with patient and wife.  Discussed risk factor modification with patient and wife - controlling BP and Cholesterol, eating heart healthy diet, maintaining a healthy weight, controlling blood sugar (Patient needs HGB A1C checked), patient does not smoke, take medications as prescribed, and exercise.   *Reviewed cardiac medications patient is currently taking in the hospital along with rationale for taking and side effects.  Explained how these medications work to help his heart and prevent progression of CAD.  Patient stated, "I am not much for taking medications.  I watch my brothers, who both had kidney transplants take medications that altered there quality of life."  I explained to patient and wife these medications are what research has determined to be best practice in treating patients who have had a heart attack.  Explained they have been proven to improve outcomes in patients with MIs/CAD.   *Emergeny Plan for chest pain/symptoms of impending heart attack reviewed with patient and wife.  Discussed NTG.  Patient again reminded me that he is not much on taking medication.     *Heart Healthy Diet - Reviewed the main components of following a heart healthy diet.  Entered an order for Dietitian Consult for education on Heart Healthy Diet.    *Exercise  - Explained to patient  and wife that he has been referred to Cardiac Rehab.  Rationale for referral and overview of the Cardiac Rehab program provided.  Initially, patient stated he wasn't sure if he was interested in Cardiac Rehab.  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   After answering his questions, patient informed this RN that he will think about Cardiac Rehab and wants to discuss with Dr. Rockey Situ at f/u appt.  We agreed that Cardiac Rehab Dept will contact patient in the next two week to see what he has decided.    Patient and wife thanked me for providing this information.    Roanna Epley, RN, BSN, [facility] Cardiovascular and Pulmonary Nurse Navigator

## 2017-10-07 NOTE — Progress Notes (Signed)
Gregory [name] to be D/C'd Home per MD order.  Discussed prescriptions and follow up appointments with the patient. Prescriptions electronically submitted, patient made aware, medication list explained in detail. Vascular discharge instructions gone over with patient. mynx brochure given to patient Pt verbalized understanding.  Allergies as of 10/07/2017      Reactions   Iodine Anaphylaxis   Shellfish Allergy Anaphylaxis      Medication List    TAKE these medications   aspirin 81 MG EC tablet Take 1 tablet (81 mg total) by mouth daily.   atenolol 25 MG tablet Commonly known as:  TENORMIN Take 25 mg by mouth daily.   atorvastatin 40 MG tablet Commonly known as:  LIPITOR Take 1 tablet (40 mg total) by mouth daily at 6 PM.   clopidogrel 75 MG tablet Commonly known as:  PLAVIX Take 1 tablet (75 mg total) by mouth daily.   fesoterodine 8 MG Tb24 tablet Commonly known as:  TOVIAZ Take 1 tablet (8 mg total) by mouth daily.   hydroxypropyl methylcellulose / hypromellose 2.5 % ophthalmic solution Commonly known as:  ISOPTO TEARS / GONIOVISC Place 1 drop into both eyes as needed for dry eyes.   losartan-hydrochlorothiazide 100-25 MG tablet Commonly known as:  HYZAAR Take 1 tablet by mouth daily.   nitroGLYCERIN 0.4 MG SL tablet Commonly known as:  NITROSTAT Place 1 tablet (0.4 mg total) under the tongue every 5 (five) minutes as needed for chest pain (hold for blood pressure less than 90).       Vitals:   10/07/17 1035 10/07/17 1211  BP: (!) 156/84 (!) 156/84  Pulse: 83 66  Resp: 17 20  Temp: 97.7 F (36.5 C) 97.6 F (36.4 C)  SpO2: 95% 94%    Skin clean, dry and intact without evidence of skin break down, no evidence of skin tears noted. IV catheter discontinued intact. Site without signs and symptoms of complications. Dressing and pressure applied. Pt denies pain at this time. No complaints noted.  An After Visit Summary was printed and given to the  patient. Patient escorted via [place], and D/C home via private auto.  [place]

## 2017-10-08 NOTE — Telephone Encounter (Signed)
Patient contacted regarding discharge from [facility] on 10/07/17.   Patient understands to follow up with provider ? On 10/15/17 at 3 pm at [facility] with Dr Rockey Situ.  Patient understands discharge instructions? Yes   Patient understands medications and regiment? Yes   Patient understands to bring all medications to this visit? Yes

## 2017-10-11 NOTE — Progress Notes (Signed)
Cardiology Office Note  Date:  10/15/2017   ID:  Gregory [name], DOB 01/02/1941, MRN [id number]  PCP:  Tracie Harrier, MD   Chief Complaint  Patient presents with  . other    Follow up cardiac cath & Echo. Patient denies chest pain and SOB. Meds reviewed verbally with patient.     HPI:  76 year old gentleman with  morbid obesity,  prostate cancer status post resection,  essential hypertension  who denies any prior cardiac history,  Evaluated for chest pain in the hospital October 06, 2017 Scheduled for cardiac catheterization  He presents for follow-up after his cardiac catheterization and hospital discharge  Cath  10/06/2017 showed Occluded PDA at the ostium, small vessel Moderate disease of OM1 proximal region, Moderate to severe disease of proximal PL branch,  Moderate disease of ostial/proximal diagonal branch #2,  Mild diffuse disease of LAD. Medical management was recommended of his residual disease, no intervention performed Occluded PDA has collaterals, diffusely diseased likely with thrombus.  He was started on aspirin Plavix, statin Already on beta-blocker  Blood pressure running high at home typically high 140 up to 150  Echocardiogram result reviewed with him showing normal LV function  EKG personally reviewed by myself on todays visit Shows normal sinus rhythm rate 88 bpm, no significant ST or T wave changes, PVCs  PMH:   has a past medical history of Cancer ([place]); Cancer ([place]); Hypertension; Kidney stones; and Loss of equilibrium.  PSH:    Past Surgical History:  Procedure Laterality Date  . BLADDER SURGERY  2012   Dr. Madelin Headings, [facility]  . BRAIN SURGERY Left 2005   Brian Tumor (stem), [facility]  . CHOLECYSTECTOMY    . CYSTOSCOPY WITH RETROGRADE URETHROGRAM  03/16/2016   Procedure: CYSTOSCOPY WITH RETROGRADE URETHROGRAM;  Surgeon: Hollice Espy, MD;  Location: [facility] ORS;  Service: Urology;;  . Consuela Mimes WITH STENT PLACEMENT Left 02/23/2016    Procedure: CYSTOSCOPY WITH STENT PLACEMENT;  Surgeon: Dereck Leep, MD;  Location: [facility] ORS;  Service: Urology;  Laterality: Left;  . CYSTOSCOPY WITH STENT PLACEMENT Left 03/16/2016   Procedure: CYSTOSCOPY WITH STENT PLACEMENT/EXCHANGE;  Surgeon: Hollice Espy, MD;  Location: [facility] ORS;  Service: Urology;  Laterality: Left;  . LEFT HEART CATH AND CORONARY ANGIOGRAPHY N/A 10/06/2017   Procedure: LEFT HEART CATH AND CORONARY ANGIOGRAPHY;  Surgeon: Minna Merritts, MD;  Location: [place] [facility];  Service: Cardiovascular;  Laterality: N/A;  . MELANOMA EXCISION    . PROSTATECTOMY  2003   Dr. Madelin Headings, [facility]  . STONE EXTRACTION WITH BASKET  03/16/2016   Procedure: STONE EXTRACTION WITH BASKET;  Surgeon: Hollice Espy, MD;  Location: [facility] ORS;  Service: Urology;;    Current Outpatient Prescriptions  Medication Sig Dispense Refill  . aspirin EC 81 MG EC tablet Take 1 tablet (81 mg total) by mouth daily. 30 tablet 0  . atenolol (TENORMIN) 25 MG tablet Take 25 mg by mouth daily.    Marland Kitchen atorvastatin (LIPITOR) 40 MG tablet Take 1 tablet (40 mg total) by mouth daily at 6 PM. 30 tablet 0  . clopidogrel (PLAVIX) 75 MG tablet Take 1 tablet (75 mg total) by mouth daily. 30 tablet 0  . fesoterodine (TOVIAZ) 8 MG TB24 tablet Take 1 tablet (8 mg total) by mouth daily. 90 each 3  . hydroxypropyl methylcellulose / hypromellose (ISOPTO TEARS / GONIOVISC) 2.5 % ophthalmic solution Place 1 drop into both eyes as needed for dry eyes.    Marland Kitchen losartan-hydrochlorothiazide (HYZAAR) 100-25 MG  tablet Take 1 tablet by mouth daily.     . nitroGLYCERIN (NITROSTAT) 0.4 MG SL tablet Place 1 tablet (0.4 mg total) under the tongue every 5 (five) minutes as needed for chest pain (hold for blood pressure less than 90). 30 tablet 0   No current facility-administered medications for this visit.      Allergies:   Iodine and Shellfish allergy   Social History:  The patient  reports that he has never smoked. He has  never used smokeless tobacco. He reports that he does not drink alcohol or use drugs.   Family History:   family history includes CAD in his father; Polycystic kidney disease in his father.    Review of Systems: Review of Systems  Constitutional: Negative.   Respiratory: Negative.   Cardiovascular: Negative.   Gastrointestinal: Negative.   Musculoskeletal: Negative.   Neurological: Negative.   Psychiatric/Behavioral: Negative.   All other systems reviewed and are negative.    PHYSICAL EXAM: VS:  BP (!) 146/66 (BP Location: Left Arm, Patient Position: Sitting, Cuff Size: Normal)   Pulse 70   Ht 5\' 8"  (1.727 m)   Wt 207 lb (93.9 kg)   BMI 31.47 kg/m  , BMI Body mass index is 31.47 kg/m. GEN: Well nourished, well developed, in no acute distress  HEENT: normal  Neck: no JVD, carotid bruits, or masses Cardiac: RRR; no murmurs, rubs, or gallops,no edema  Respiratory:  clear to auscultation bilaterally, normal work of breathing GI: soft, nontender, nondistended, + BS MS: no deformity or atrophy  Skin: warm and dry, no rash Neuro:  Strength and sensation are intact Psych: euthymic mood, full affect    Recent Labs: 10/06/2017: ALT 20; Hemoglobin 15.3; Magnesium 2.2; Platelets 229; TSH 0.660 10/07/2017: BUN 27; Creatinine, Ser 1.24; Potassium 4.4; Sodium 139    Lipid Panel Lab Results  Component Value Date   CHOL 167 10/06/2017   HDL 37 (L) 10/06/2017   LDLCALC 104 (H) 10/06/2017   TRIG 128 10/06/2017      Wt Readings from Last 3 Encounters:  10/15/17 207 lb (93.9 kg)  10/06/17 205 lb (93 kg)  04/23/17 209 lb (94.8 kg)       ASSESSMENT AND PLAN:  Coronary artery disease of native artery of native heart with stable angina pectoris [facility]) Catheterization details as above Stressed importance of aggressive lipid management, blood pressure control  Essential hypertension Blood pressure continues to run high, recommended he start amlodipine 10 mg daily We have  sent in triple combination pill with Benicar, HCTZ, amlodipine Suggested he check his blood pressure at home and call our office  Mixed hyperlipidemia Recommend he stay on Lipitor 40 mg daily, goal LDL less than 70 Recently started on statin  Non-ST elevation (NSTEMI) myocardial infarction [facility]) We have recommended that he stay on aspirin and Plavix after recent non-STEMI Catheterization documenting occluded PDA  Disposition:   F/U  12 months   Total encounter time more than 45 minutes  Greater than 50% was spent in counseling and coordination of care with the patient   No orders of the defined types were placed in this encounter.    Signed, Esmond Plants, M.D., Ph.D. 10/15/2017  [place], [place]

## 2017-10-15 ENCOUNTER — Ambulatory Visit (INDEPENDENT_AMBULATORY_CARE_PROVIDER_SITE_OTHER): Payer: Medicare HMO | Admitting: Cardiovascular Disease

## 2017-10-15 ENCOUNTER — Encounter: Payer: Self-pay | Admitting: Cardiovascular Disease

## 2017-10-15 VITALS — BP 146/66 | HR 70 | Ht 68.0 in | Wt 207.0 lb

## 2017-10-15 DIAGNOSIS — E782 Mixed hyperlipidemia: Secondary | ICD-10-CM | POA: Diagnosis not present

## 2017-10-15 DIAGNOSIS — I214 Non-ST elevation (NSTEMI) myocardial infarction: Secondary | ICD-10-CM

## 2017-10-15 DIAGNOSIS — C61 Malignant neoplasm of prostate: Secondary | ICD-10-CM | POA: Diagnosis not present

## 2017-10-15 DIAGNOSIS — I25118 Atherosclerotic heart disease of native coronary artery with other forms of angina pectoris: Secondary | ICD-10-CM

## 2017-10-15 DIAGNOSIS — I10 Essential (primary) hypertension: Secondary | ICD-10-CM

## 2017-10-15 DIAGNOSIS — Z09 Encounter for follow-up examination after completed treatment for conditions other than malignant neoplasm: Secondary | ICD-10-CM | POA: Diagnosis not present

## 2017-10-15 DIAGNOSIS — T7840XD Allergy, unspecified, subsequent encounter: Secondary | ICD-10-CM | POA: Diagnosis not present

## 2017-10-15 MED ORDER — ATENOLOL 25 MG PO TABS: 25.0000 mg | ORAL_TABLET | Freq: Every day | ORAL | 4 refills | Status: DC

## 2017-10-15 MED ORDER — CLOPIDOGREL BISULFATE 75 MG PO TABS: 75.0000 mg | ORAL_TABLET | Freq: Every day | ORAL | 4 refills | Status: DC

## 2017-10-15 MED ORDER — ATORVASTATIN CALCIUM 40 MG PO TABS: 40.0000 mg | ORAL_TABLET | Freq: Every day | ORAL | 4 refills | Status: DC

## 2017-10-15 MED ORDER — OLMESARTAN-AMLODIPINE-HCTZ 40-10-25 MG PO TABS: 1.0000 | ORAL_TABLET | Freq: Every day | ORAL | 11 refills | Status: DC

## 2017-10-15 NOTE — Patient Instructions (Addendum)
Medication Instructions:   Please hold the losartan HCTZ  Start the new triple compo pill  Labwork:  No new labs needed  Testing/Procedures:  No further testing at this time   Follow-Up: It was a pleasure seeing you in the office today. Please call us if you have new issues that need to be addressed before your next appt.  [phone]  Your physician wants you to follow-up in: 12 months.  You will receive a reminder letter in the mail two months in advance. If you don't receive a letter, please call our office to schedule the follow-up appointment.  If you need a refill on your cardiac medications before your next appointment, please call your pharmacy.

## 2017-10-18 NOTE — Discharge Summary (Signed)
[facility] [place] NAME: Gregory [name]    MR#:  [id number]  DATE OF BIRTH:  17-Nov-1941  DATE OF ADMISSION:  10/06/2017 ADMITTING PHYSICIAN: Gregory Shelling, MD  DATE OF DISCHARGE: 10/07/2017  2:51 PM  PRIMARY CARE PHYSICIAN: Gregory Harrier, MD   ADMISSION DIAGNOSIS:  Acute coronary syndrome (HCC) [I24.9] Chest pain, unspecified type [R07.9]  DISCHARGE DIAGNOSIS:  Principal Problem:   Non-ST elevation (NSTEMI) myocardial infarction [facility]) Active Problems:   Essential hypertension   HLD (hyperlipidemia)   Hyperglycemia   Leukocytosis   Non Q wave myocardial infarction ([place])   SECONDARY DIAGNOSIS:   Past Medical History:  Diagnosis Date  . Cancer [facility])    Prostate  . Cancer ([place])    Melanoma  . Hypertension   . Kidney stones   . 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 male with a known history of prostate cancer, melanoma, hypertension presented to the emergency room with chest pain. Patient noticed this chest pressure around 9:30 PM last night. Patient was watching television when he experienced the chest pressure. It was pressure-like sensation in the left side of the chest. No radiation of the pressure. Patient says it is 6 out of 10 on a scale of 1-10. He was evaluated in the emergency room EKG normal sinus rhythm with no ST segment changes. First set of troponin was negative. Blood pressure was elevated and patient presented to the emergency room he was given nitrates and blood pressure was controlled.  HOSPITAL COURSE:   * NSTEMI Troponin slowly trended up.  Patient had cardiac catheterization which showed all Occluded PDA at the ostium, small vessel Moderate disease of OM1 proximal region, Moderate to severe disease of proximal PL branch,  Moderate disease of ostial/proximal diagonal branch #2,   Mild diffuse disease of LAD. Medical management was recommended of his residual disease, no intervention performed Occluded PDA has collaterals, diffusely diseased likely with thrombus.  He was started on aspirin Plavix, statin Already on beta-blocker  Patient was monitored overnight in the hospital.  Followed by cardiology.  Discharged in stable condition with cardiology follow-up and cardiac rehab referral.  CONSULTS OBTAINED:    DRUG ALLERGIES:   Allergies  Allergen Reactions  . Iodine Anaphylaxis  . Shellfish Allergy Anaphylaxis    DISCHARGE MEDICATIONS:   Discharge Medication List as of 10/07/2017  1:42 PM    START taking these medications   Details  aspirin EC 81 MG EC tablet Take 1 tablet (81 mg total) by mouth daily., Starting Thu 10/07/2017, Normal    nitroGLYCERIN (NITROSTAT) 0.4 MG SL tablet Place 1 tablet (0.4 mg total) under the tongue every 5 (five) minutes as needed for chest pain (hold for blood pressure less than 90)., Starting Thu 10/07/2017, Normal    atorvastatin (LIPITOR) 40 MG tablet Take 1 tablet (40 mg total) by mouth daily at 6 PM., Starting Wed 10/06/2017, Normal    clopidogrel (PLAVIX) 75 MG tablet Take 1 tablet (75 mg total) by mouth daily., Starting Wed 10/06/2017, Until Thu 10/06/2018, Normal      CONTINUE these medications which have NOT CHANGED   Details  fesoterodine (TOVIAZ) 8 MG TB24 tablet Take 1 tablet (8 mg total) by mouth daily., Starting Fri 04/23/2017, Normal    atenolol (TENORMIN) 25 MG tablet Take 25 mg by  mouth daily., Historical Med    losartan-hydrochlorothiazide (HYZAAR) 100-25 MG tablet Take 1 tablet by mouth daily. , Historical Med    hydroxypropyl methylcellulose / hypromellose (ISOPTO TEARS / GONIOVISC) 2.5 % ophthalmic solution Place 1 drop into both eyes as needed for dry eyes., Historical Med        Today   VITAL SIGNS:  Blood pressure (!) 156/84, pulse 66, temperature 97.6 F (36.4 C), temperature source Oral,  resp. rate 20, height 5\' 8"  (1.727 m), weight 93 kg (205 lb), SpO2 94 %.  I/O:  No intake or output data in the 24 hours ending 10/18/17 1240  PHYSICAL EXAMINATION:  Physical Exam  GENERAL:  76 y.o.-year-old patient lying in the bed with no acute distress.  LUNGS: Normal breath sounds bilaterally, no wheezing, rales,rhonchi or crepitation. No use of accessory muscles of respiration.  CARDIOVASCULAR: S1, S2 normal. No murmurs, rubs, or gallops.  ABDOMEN: Soft, non-tender, non-distended. Bowel sounds present. No organomegaly or mass.  NEUROLOGIC: Moves all 4 extremities. PSYCHIATRIC: The patient is alert and oriented x 3.  SKIN: No obvious rash, lesion, or ulcer.   DATA REVIEW:   CBC No results for input(s): WBC, HGB, HCT, PLT in the last 168 hours.  Chemistries  No results for input(s): NA, K, CL, CO2, GLUCOSE, BUN, CREATININE, CALCIUM, MG, AST, ALT, ALKPHOS, BILITOT in the last 168 hours.  Invalid input(s): GFRCGP  Cardiac Enzymes No results for input(s): TROPONINI in the last 168 hours.  Microbiology Results  Results for orders placed or performed in visit on 04/23/17  Microscopic Examination     Status: Abnormal   Collection Time: 04/23/17  9:36 AM  Result Value Ref Range Status   WBC, UA 0-5 0 - 5 /hpf Final   RBC, UA None seen 0 - 2 /hpf Final   Epithelial Cells (non renal) 0-10 0 - 10 /hpf Final   Mucus, UA Present (A) Not Estab. Final   Bacteria, UA Few (A) None seen/Few Final    RADIOLOGY:  No results found.  Follow up with PCP in 1 week.  Management plans discussed with the patient, family and they are in agreement.  CODE STATUS:  Code Status History    Date Active Date Inactive Code Status Order ID Comments User Context   10/06/2017  2:57 AM 10/07/2017  5:51 PM Full Code [id number]  Gregory Shelling, MD ED   06/05/2016 10:11 PM 06/06/2016  3:50 PM Full Code [id number]  Alesia Richards, MD ED      TOTAL TIME TAKING CARE OF THIS PATIENT ON DAY OF DISCHARGE:  more than 30 minutes.   Gregory [name] R M.D on 10/18/2017 at 12:40 PM  Between 7am to 6pm - Pager - [phone]  After 6pm go to [email or web address] - password [email or web address] [facility]  Office  [phone]  CC: Primary care physician; Gregory Harrier, MD  Note: This dictation was prepared with Dragon dictation along with smaller phrase technology. Any transcriptional errors that result from this process are unintentional.

## 2017-12-03 ENCOUNTER — Telehealth: Payer: Self-pay | Admitting: Cardiovascular Disease

## 2017-12-03 NOTE — Telephone Encounter (Signed)
Pt states he needs to discuss the fluctuation of his BP being on the new medication. He is not sure the name.  States his BP lowest was 92/55 he experiences dizziness, nausea, no energy. Please call.

## 2017-12-03 NOTE — Telephone Encounter (Signed)
Left voicemail message.

## 2017-12-06 NOTE — Telephone Encounter (Signed)
Patient returning call.

## 2017-12-07 NOTE — Telephone Encounter (Signed)
Spoke with the patient.  He complains of dizziness that has been fairly consistent since starting olmesartan-amlodipine-hctz in October. He reports he has tried to let his body get used to this for about 6 weeks, but he feels this is not the right medication for him. He has had flucuations in his blood pressure from 92-137/49-85. He reports dizziness with BP's in the 90's as well as with BP's in the 120's so he does not think it is his BP as much as it is the medication.  HR's are upper 50's-upper 60's. He reports that he went and worked out in the snow 1 day for about 20 minutes and his HR did not get above 65 bpm. He is currently on atenolol 25 mg once daily.  The patient was previously on atenolol 25 mg once daily and losartan/hctz, but BP's were running 840'R-754'H systolic. However, he did not have the dizziness.  He would like to re-evaluate his medications with Dr. Rockey Situ to see if there is something else he could take to try to help his BP, but also eliminate the dizziness. I advised I would forward to Dr. Rockey Situ to review and will call back with recommendations.  He is agreeable.

## 2017-12-07 NOTE — Telephone Encounter (Signed)
Suspect dose may be too high. There are numerous dose variations, First thing to try would be to cut in 1/2 daily

## 2017-12-08 MED ORDER — OLMESARTAN-AMLODIPINE-HCTZ 40-10-25 MG PO TABS: ORAL_TABLET | ORAL | Status: DC

## 2017-12-08 NOTE — Telephone Encounter (Signed)
I spoke with the patient and made him aware of Dr. Donivan Scull recommendations to decrease olmesartan-amlodipine-hctz 40/10/25 mg to 1/2 tablet once daily. The patient states he is agreeable with this. He took a full pill last night.  SBP this morning is 120 and his HR was 54. I advised him that when he is due for his dose tonight to just take the 1/2 tablet. He is agreeable with this plan. I have advised him to see how his symptoms are over the next several days and if no better, I have asked that he call us back. He voices understanding.

## 2017-12-21 DIAGNOSIS — D039 Melanoma in situ, unspecified: Secondary | ICD-10-CM

## 2017-12-21 HISTORY — DX: Melanoma in situ, unspecified: D03.9

## 2018-01-03 DIAGNOSIS — H43812 Vitreous degeneration, left eye: Secondary | ICD-10-CM | POA: Diagnosis not present

## 2018-01-13 DIAGNOSIS — I10 Essential (primary) hypertension: Secondary | ICD-10-CM | POA: Diagnosis not present

## 2018-01-13 DIAGNOSIS — I214 Non-ST elevation (NSTEMI) myocardial infarction: Secondary | ICD-10-CM | POA: Diagnosis not present

## 2018-01-13 DIAGNOSIS — T7840XD Allergy, unspecified, subsequent encounter: Secondary | ICD-10-CM | POA: Diagnosis not present

## 2018-01-13 DIAGNOSIS — Z09 Encounter for follow-up examination after completed treatment for conditions other than malignant neoplasm: Secondary | ICD-10-CM | POA: Diagnosis not present

## 2018-01-13 DIAGNOSIS — C61 Malignant neoplasm of prostate: Secondary | ICD-10-CM | POA: Diagnosis not present

## 2018-01-19 DIAGNOSIS — L570 Actinic keratosis: Secondary | ICD-10-CM | POA: Diagnosis not present

## 2018-01-19 DIAGNOSIS — L578 Other skin changes due to chronic exposure to nonionizing radiation: Secondary | ICD-10-CM | POA: Diagnosis not present

## 2018-01-20 DIAGNOSIS — I10 Essential (primary) hypertension: Secondary | ICD-10-CM | POA: Diagnosis not present

## 2018-01-20 DIAGNOSIS — C61 Malignant neoplasm of prostate: Secondary | ICD-10-CM | POA: Diagnosis not present

## 2018-01-20 DIAGNOSIS — I2510 Atherosclerotic heart disease of native coronary artery without angina pectoris: Secondary | ICD-10-CM | POA: Diagnosis not present

## 2018-01-20 DIAGNOSIS — M25511 Pain in right shoulder: Secondary | ICD-10-CM | POA: Diagnosis not present

## 2018-01-20 DIAGNOSIS — Z0000 Encounter for general adult medical examination without abnormal findings: Secondary | ICD-10-CM | POA: Diagnosis not present

## 2018-02-03 DIAGNOSIS — H43812 Vitreous degeneration, left eye: Secondary | ICD-10-CM | POA: Diagnosis not present

## 2018-02-03 DIAGNOSIS — H40003 Preglaucoma, unspecified, bilateral: Secondary | ICD-10-CM | POA: Diagnosis not present

## 2018-03-10 ENCOUNTER — Telehealth: Payer: Self-pay | Admitting: Cardiovascular Disease

## 2018-03-10 NOTE — Telephone Encounter (Signed)
To Dr. Gollan to review.  

## 2018-03-10 NOTE — Telephone Encounter (Signed)
Please see note below.  The component Olmesartan for Olmesartan -Amlpdipine-HCTZ is currently on back order.  Pharmacy would like to know if there is a replacement medication to complete the refill.

## 2018-03-10 NOTE — Telephone Encounter (Signed)
[name] from [facility] is calling in regards to patients medication olmesartan-amlodipine-HCTZ  The component olmesartan is on back order Would like to know if there is a medication that can replace that to complete the refill  Please call to discuss

## 2018-03-13 NOTE — Telephone Encounter (Signed)
Split into separate pills Two scripts for now

## 2018-03-15 MED ORDER — AMLODIPINE BESYLATE 10 MG PO TABS: 10.0000 mg | ORAL_TABLET | Freq: Every day | ORAL | 3 refills | Status: DC

## 2018-03-15 MED ORDER — HYDROCHLOROTHIAZIDE 25 MG PO TABS: 25.0000 mg | ORAL_TABLET | Freq: Every day | ORAL | 3 refills | Status: DC

## 2018-03-15 NOTE — Telephone Encounter (Signed)
Spoke with patient and made him aware of medication switch and backlog for the olmesartan. Advised him to monitor his blood pressures and to let us know if they consistently remain 140/90. He verbalized understanding of our conversation, agreement with plan, and had no further questions at this time.

## 2018-03-15 NOTE — Telephone Encounter (Signed)
Left voicemail message to call back  

## 2018-03-15 NOTE — Telephone Encounter (Signed)
Spoke with [name] pharmacist at [facility] and he states that medication was separated due to expense for the combination pill so patient has been taking the other ones. Advised that I would reach out to patient and have him monitor blood pressure readings with instructions to call us if any problems arise. He was appreciative for the call back with no further questions at this time.

## 2018-03-21 DIAGNOSIS — L570 Actinic keratosis: Secondary | ICD-10-CM | POA: Diagnosis not present

## 2018-04-21 DIAGNOSIS — L570 Actinic keratosis: Secondary | ICD-10-CM | POA: Diagnosis not present

## 2018-04-29 ENCOUNTER — Ambulatory Visit: Payer: Medicare HMO | Admitting: Urology

## 2018-04-29 ENCOUNTER — Encounter: Payer: Self-pay | Admitting: Urology

## 2018-04-29 VITALS — BP 185/77 | HR 71 | Ht 67.0 in | Wt 205.0 lb

## 2018-04-29 DIAGNOSIS — Z8546 Personal history of malignant neoplasm of prostate: Secondary | ICD-10-CM | POA: Diagnosis not present

## 2018-04-29 DIAGNOSIS — N200 Calculus of kidney: Secondary | ICD-10-CM

## 2018-04-29 DIAGNOSIS — N3281 Overactive bladder: Secondary | ICD-10-CM

## 2018-04-29 DIAGNOSIS — N393 Stress incontinence (female) (male): Secondary | ICD-10-CM

## 2018-04-29 MED ORDER — FESOTERODINE FUMARATE ER 8 MG PO TB24: 8.0000 mg | ORAL_TABLET | Freq: Every day | ORAL | 3 refills | Status: DC

## 2018-04-29 NOTE — Progress Notes (Signed)
04/29/2018 12:23 PM   Gregory [name] 05/13/1941 [id number]  Referring provider: Tracie Harrier, MD [address] [facility] [place], [place] [address]  Chief Complaint  Patient presents with  . Nephrolithiasis    1year    HPI: 77 year old male with multiple GU issues who presents today for routine annual follow-up.  History of kidney stones stone  Last KUB 04/2017 negative for stones  s/p L URS 02/2016 for 4 mm left midureteral stone, 3 mm nonobstructing L stone  Pre2017- 6 other previous remote stone episodes which has not required surgical intervention Stone analysis 97% calcium oxalate, 3% calcium phosphate No flank pain, gross hematuria, or any stone episodes in the past year  History of benign bladder growth  TURBT in 2012, PUNLMP Previously followed by cystoscopy annually, last 1 year ago at time of URS (2017) UA 01/13/2018 unremarkable  History of prostate cancer S/p open radical prostatectomy 2013 PSA 09/2016 0.01, stable Due for PSA with PCP in near future  SUI/ urinary urgency Rare leakage Minimal bother Doing well on Toviaz for urinary frequency- voids every 2.5-3 hours Only get up 1-2 times at night    PMH: Past Medical History:  Diagnosis Date  . Cancer [facility])    Prostate  . Cancer ([place])    Melanoma  . Hypertension   . Kidney stones   . 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 BLADDER SURGERY  2012   Dr. Madelin Headings, [facility]  . BRAIN SURGERY Left 2005   Brian Tumor (stem), [facility]  . CHOLECYSTECTOMY    . CYSTOSCOPY WITH RETROGRADE URETHROGRAM  03/16/2016   Procedure: CYSTOSCOPY WITH RETROGRADE URETHROGRAM;  Surgeon: Hollice Espy, MD;  Location: [facility] ORS;  Service: Urology;;  . Consuela Mimes WITH STENT PLACEMENT Left 02/23/2016   Procedure: CYSTOSCOPY WITH STENT PLACEMENT;  Surgeon: Dereck Leep, MD;  Location: [facility] ORS;  Service: Urology;  Laterality: Left;  . CYSTOSCOPY WITH STENT PLACEMENT Left 03/16/2016   Procedure: CYSTOSCOPY WITH STENT PLACEMENT/EXCHANGE;  Surgeon: Hollice Espy, MD;  Location: [facility] ORS;  Service: Urology;  Laterality: Left;  . LEFT HEART CATH AND CORONARY ANGIOGRAPHY N/A 10/06/2017   Procedure: LEFT HEART CATH AND CORONARY ANGIOGRAPHY;  Surgeon: Minna Merritts, MD;  Location: [place] [facility];  Service: Cardiovascular;  Laterality: N/A;  . MELANOMA EXCISION    . PROSTATECTOMY  2003   [facility]. Madelin Headings, [facility]  . STONE EXTRACTION WITH BASKET  03/16/2016   Procedure: STONE EXTRACTION WITH BASKET;  Surgeon: Hollice Espy, MD;  Location: [facility] ORS;  Service: Urology;;    Home Medications:  Allergies as of 04/29/2018      Reactions   Iodine Anaphylaxis   Shellfish Allergy Anaphylaxis      Medication List        Accurate as of 04/29/18 12:23 PM. Always use your most recent med list.          amLODipine 10 MG tablet Commonly known as:  NORVASC Take 1 tablet (10 mg total) by mouth daily.   aspirin 81 MG EC tablet Take 1 tablet (81 mg total) by mouth daily.   atenolol 25 MG tablet Commonly known as:  TENORMIN Take 1 tablet (25 mg total) by mouth daily.   atorvastatin 40 MG tablet Commonly known as:  LIPITOR Take 1 tablet (40 mg total) by mouth daily at 6  PM.   clopidogrel 75 MG tablet Commonly known as:  PLAVIX Take 1 tablet (75 mg total) by mouth daily.   fesoterodine 8 MG Tb24 tablet Commonly known as:  TOVIAZ Take 1 tablet (8 mg total) by mouth daily.   hydrochlorothiazide 25 MG tablet Commonly known as:  HYDRODIURIL Take 1 tablet (25 mg total) by mouth daily.   hydroxypropyl methylcellulose / hypromellose 2.5 % ophthalmic solution Commonly known as:  ISOPTO TEARS / GONIOVISC Place 1 drop into both eyes as needed for dry eyes.   losartan 100 MG tablet Commonly known as:  COZAAR Take 100 mg by mouth daily.     nitroGLYCERIN 0.4 MG SL tablet Commonly known as:  NITROSTAT Place 1 tablet (0.4 mg total) under the tongue every 5 (five) minutes as needed for chest pain (hold for blood pressure less than 90).       Allergies:  Allergies  Allergen Reactions  . Iodine Anaphylaxis  . Shellfish Allergy Anaphylaxis    Family History: Family History  Problem Relation Age of Onset  . CAD Father   . Polycystic kidney disease Father   . Diabetes Mellitus II Neg Hx   . Hypertension Neg Hx     Social History:  reports that he has never smoked. He has never used smokeless tobacco. He reports that he does not drink alcohol or use drugs.  ROS: UROLOGY Frequent Urination?: No Hard to postpone urination?: No Burning/pain with urination?: No Get up at night to urinate?: No Leakage of urine?: No Urine stream starts and stops?: No Trouble starting stream?: No Do you have to strain to urinate?: No Blood in urine?: No Urinary tract infection?: No Sexually transmitted disease?: No Injury to kidneys or bladder?: No Painful intercourse?: No Weak stream?: No Erection problems?: No Penile pain?: No  Gastrointestinal Nausea?: No Vomiting?: No Indigestion/heartburn?: No Diarrhea?: No Constipation?: No  Constitutional Fever: No Night sweats?: No Weight loss?: No Fatigue?: No  Skin Skin rash/lesions?: No Itching?: No  Eyes Blurred vision?: No Double vision?: No  Ears/Nose/Throat Sore throat?: No Sinus problems?: No  Hematologic/Lymphatic Swollen glands?: No Easy bruising?: No  Cardiovascular Leg swelling?: No Chest pain?: No  Respiratory Cough?: No Shortness of breath?: No  Endocrine Excessive thirst?: No  Musculoskeletal Back pain?: No Joint pain?: No  Neurological Headaches?: No Dizziness?: No  Psychologic Depression?: No Anxiety?: No  Physical Exam: BP (!) 185/77   Pulse 71   Ht 5\' 7"  (1.702 m)   Wt 205 lb (93 kg)   BMI 32.11 kg/m   Constitutional:   Alert and oriented, No acute distress. HEENT: [place] AT, moist mucus membranes.  Trachea midline, no masses. Cardiovascular: No clubbing, cyanosis, or edema. Respiratory: Normal respiratory effort, no increased work of breathing. Skin: No rashes, bruises or suspicious lesions. Neurologic: Grossly intact, no focal deficits, moving all 4 extremities. Psychiatric: Normal mood and affect.  Laboratory Data: Labs from care everywhere 01/13/2017 reviewed personally today Creatinine 1.2, stable  Urinalysis UA from 01/13/2018 also personally reviewed today, negative without evidence of any microscopic blood  Pertinent Imaging: NA   Assessment & Plan:    1. History of kidney stones Currently asymptomatic KUB 1 year ago negative for any stones We will follow  clinically  2. OAB (overactive bladder) - fesoterodine (TOVIAZ) 8 MG TB24 tablet; Take 1 tablet (8 mg total) by mouth daily.  Dispense: 90 each; Refill: 3 Doing well on Toviaz  3. History of prostate cancer Recommend continued annual PSA testing Patient reports  that this is scheduled with his PCP, advised to let me know if it is rising  4. History of benign neoplasm of bladder Patient wishes to abstain from further cysto given pathology and no recurrence x 5 years Last cysto 02/2016 NED No microscopic blood on most recent urinalysis  5. SUI (stress urinary incontinence), male Minimal bother but does notice some mild Reviewed Kegel exercises today   Return in about 1 year (around 04/30/2019) for UA, symptoms recheck.  Hollice Espy, MD  [facility] [address] [place], [place] [address] [phone]

## 2018-04-29 NOTE — Telephone Encounter (Signed)
° °  Patient has been taking Losartan 50 mg po q day in place of olmesartan 40 mg po q day because the pharmacy is still out of this medication.  Patient is almost out of Losartan and wants to know what to do now.  Please call.

## 2018-04-29 NOTE — Telephone Encounter (Signed)
I spoke with the patient. He states that back in the fall Dr. Rockey Situ had prescribed a combination pill for him Engineer, water) so he wouldn't have to take so many pills. The cost for him was a $90 copay for the tribenzor, therefore he was prescribed the 3 different tablets (amlodipine, HCTZ, & olmesartan).  Olmesartan went on backorder and now he is on losartan, which is now on a national backorder per his pharmacist with no release date.  The patient states he was prescribed losartan 100 mg once daily and for the last week or so he has been cutting these in 1/2 to make them last.  Per the patient, he currently has about a weeks worth of losartan left if he cuts the pill in 1/2. He states his BP's in the mornings have been running around 140/70, but in the afternoon they are down to the 120's/60.   He is inquiring what he needs to do about his losartan at this time.  I advised I will forward to Dr. Rockey Situ to review and call him back with recommendations.  The patient voices understanding and is agreeable.

## 2018-05-02 ENCOUNTER — Other Ambulatory Visit: Payer: Self-pay | Admitting: Cardiovascular Disease

## 2018-05-02 NOTE — Telephone Encounter (Signed)
Can we see if [facility] has different ARB? Perhaps different pharmacy may have ARB? Other options may include valsartan 360mg 

## 2018-05-02 NOTE — Telephone Encounter (Signed)
S/w [facility]. They have both losartan and olmesartan in stock.   S/w patient. When he was originally prescribed the olmesartan combination pill, the pharmacy did not have the individual olmesartan pill. Now the pharmacy does have olmesartan.  He prefers to take olmesartan. Will route to Dr Rockey Situ to verify this is ok.

## 2018-05-03 NOTE — Telephone Encounter (Signed)
Would put him back on olmesartan same dose

## 2018-05-04 MED ORDER — OLMESARTAN MEDOXOMIL 40 MG PO TABS: 40.0000 mg | ORAL_TABLET | Freq: Every day | ORAL | 11 refills | Status: DC

## 2018-05-04 NOTE — Addendum Note (Signed)
Addended by: Valora Corporal on: 05/04/2018 08:52 AM   Modules accepted: Orders

## 2018-05-04 NOTE — Telephone Encounter (Signed)
Spoke with patient and confirmed olmesartan 40 mg once daily and to send to [facility]. Reviewed that I would also discontinue losartan from his chart as well. He verbalized understanding of our conversation, agreement with plan, and had no further questions at this time. Requested that he give [place] a return call if they should have any problems or further questions.

## 2018-07-05 ENCOUNTER — Telehealth: Payer: Self-pay | Admitting: Cardiovascular Disease

## 2018-07-05 NOTE — Telephone Encounter (Signed)
Routing to Dr Gollan for advice. 

## 2018-07-05 NOTE — Telephone Encounter (Signed)
Can we try a different pharmacy Before we change medication?

## 2018-07-05 NOTE — Telephone Encounter (Signed)
Patient calling  Patient's pharmacy states they are currently out of olmesartan medication Would like advise on what to do or if there is another medication to prescribe Please call to discuss

## 2018-07-06 NOTE — Telephone Encounter (Signed)
Left message for patient to call back  

## 2018-07-08 NOTE — Telephone Encounter (Signed)
Spoke with patient and he has been out of his Olmesartan since Monday. He reports that his blood pressure readings have been for the most part in the 120's range. This morning it was 140/72. He reports that previously he was on a triple combo pill and his copay was $97.00. Then Dr. Rockey Situ sepearated the pills and he is now paying just $9.00. He has not had any problems getting the other two meds but this Olmesartan is on national shortage and they are not able to get it. Advised that Dr. Rockey Situ requested him to see if available at another pharmacy. He was agreeable to try calling other pharmacies near him. Instructed him to monitor blood pressures daily and keep a log of those readings and to call us if BP should consistently remain 140/90 or higher. He verbalized understanding of our conversation, agreement with plan, and had no further questions at this time. He was agreeable to call back if he should find this medication at different pharmacy. Note routed to provider.

## 2018-07-09 NOTE — Telephone Encounter (Signed)
He could try other ARB Could try losartan 50 mg daily Or valsartan 160 mg daily Irbesartan 150 mg daily

## 2018-07-11 NOTE — Telephone Encounter (Signed)
Left voicemail message to call back  

## 2018-07-11 NOTE — Telephone Encounter (Signed)
Patient returning call States he did find some other meds Please call

## 2018-07-11 NOTE — Telephone Encounter (Signed)
Spoke with patient and he reports that his wife was able to get some more of the olmesartan so he doesn't need to change now. Instructed him to please let us know if he is unable to get his medications again and we could try something else. He verbalized understanding with no further questions at this time.

## 2018-07-14 DIAGNOSIS — T7840XD Allergy, unspecified, subsequent encounter: Secondary | ICD-10-CM | POA: Diagnosis not present

## 2018-07-14 DIAGNOSIS — I10 Essential (primary) hypertension: Secondary | ICD-10-CM | POA: Diagnosis not present

## 2018-07-14 DIAGNOSIS — I2510 Atherosclerotic heart disease of native coronary artery without angina pectoris: Secondary | ICD-10-CM | POA: Diagnosis not present

## 2018-07-14 DIAGNOSIS — Z0000 Encounter for general adult medical examination without abnormal findings: Secondary | ICD-10-CM | POA: Diagnosis not present

## 2018-07-14 DIAGNOSIS — C61 Malignant neoplasm of prostate: Secondary | ICD-10-CM | POA: Diagnosis not present

## 2018-07-14 DIAGNOSIS — M25511 Pain in right shoulder: Secondary | ICD-10-CM | POA: Diagnosis not present

## 2018-07-22 DIAGNOSIS — I10 Essential (primary) hypertension: Secondary | ICD-10-CM | POA: Diagnosis not present

## 2018-07-22 DIAGNOSIS — C61 Malignant neoplasm of prostate: Secondary | ICD-10-CM | POA: Diagnosis not present

## 2018-07-22 DIAGNOSIS — I2510 Atherosclerotic heart disease of native coronary artery without angina pectoris: Secondary | ICD-10-CM | POA: Diagnosis not present

## 2018-07-22 DIAGNOSIS — D333 Benign neoplasm of cranial nerves: Secondary | ICD-10-CM | POA: Diagnosis not present

## 2018-07-22 DIAGNOSIS — Z0000 Encounter for general adult medical examination without abnormal findings: Secondary | ICD-10-CM | POA: Diagnosis not present

## 2018-07-27 DIAGNOSIS — D3617 Benign neoplasm of peripheral nerves and autonomic nervous system of trunk, unspecified: Secondary | ICD-10-CM | POA: Diagnosis not present

## 2018-07-27 DIAGNOSIS — L570 Actinic keratosis: Secondary | ICD-10-CM | POA: Diagnosis not present

## 2018-07-27 DIAGNOSIS — D225 Melanocytic nevi of trunk: Secondary | ICD-10-CM | POA: Diagnosis not present

## 2018-07-27 DIAGNOSIS — C44222 Squamous cell carcinoma of skin of right ear and external auricular canal: Secondary | ICD-10-CM | POA: Diagnosis not present

## 2018-07-27 DIAGNOSIS — Z85820 Personal history of malignant melanoma of skin: Secondary | ICD-10-CM | POA: Diagnosis not present

## 2018-07-27 DIAGNOSIS — Z85828 Personal history of other malignant neoplasm of skin: Secondary | ICD-10-CM | POA: Diagnosis not present

## 2018-07-27 DIAGNOSIS — D1800 Hemangioma unspecified site: Secondary | ICD-10-CM | POA: Diagnosis not present

## 2018-07-27 DIAGNOSIS — L578 Other skin changes due to chronic exposure to nonionizing radiation: Secondary | ICD-10-CM | POA: Diagnosis not present

## 2018-07-27 DIAGNOSIS — Z1283 Encounter for screening for malignant neoplasm of skin: Secondary | ICD-10-CM | POA: Diagnosis not present

## 2018-07-27 DIAGNOSIS — C4492 Squamous cell carcinoma of skin, unspecified: Secondary | ICD-10-CM

## 2018-07-27 HISTORY — DX: Squamous cell carcinoma of skin, unspecified: C44.92

## 2018-08-03 DIAGNOSIS — H40003 Preglaucoma, unspecified, bilateral: Secondary | ICD-10-CM | POA: Diagnosis not present

## 2018-08-15 DIAGNOSIS — H40003 Preglaucoma, unspecified, bilateral: Secondary | ICD-10-CM | POA: Diagnosis not present

## 2018-08-23 DIAGNOSIS — R0683 Snoring: Secondary | ICD-10-CM | POA: Diagnosis not present

## 2018-08-23 DIAGNOSIS — J342 Deviated nasal septum: Secondary | ICD-10-CM | POA: Diagnosis not present

## 2018-08-23 DIAGNOSIS — J3489 Other specified disorders of nose and nasal sinuses: Secondary | ICD-10-CM | POA: Diagnosis not present

## 2018-09-13 ENCOUNTER — Other Ambulatory Visit: Payer: Self-pay | Admitting: Cardiovascular Disease

## 2018-09-23 DIAGNOSIS — Z23 Encounter for immunization: Secondary | ICD-10-CM | POA: Diagnosis not present

## 2018-11-07 ENCOUNTER — Other Ambulatory Visit: Payer: Self-pay | Admitting: Cardiovascular Disease

## 2018-11-13 NOTE — Progress Notes (Signed)
Cardiology Office Note  Date:  11/14/2018   ID:  Gregory [name], DOB [birth year], MRN [id number]  PCP:  Tracie Harrier, MD   Chief Complaint  Patient presents with  . other    12 mo follow up. Denies any chest pain or SOB. Medication Questions. Medications reviewed verbally.     HPI:  77 year old gentleman with  CAD on cardiac catheterization October 2018 Occluded PDA, collaterals, medical management recommended morbid obesity,  prostate cancer status post resection,  essential hypertension  who denies any prior cardiac history,  Evaluated for chest pain in the hospital October 06, 2017 cardiac catheterization  He presents for follow-up of his coronary artery disease  Wants to decrease med list, ?plavix  Sedentary, Trace ankle swelling No chest pain  Mild orthopedic issues Poor diet, high carbohydrates, weight continues to run high  Long discussion concerning his medications, likely taking the mall in the morning, sometimes blood pressure runs low to 100 No orthostasis symptoms  Lab work reviewed from July 2019 Hematocrit 47 Creatinine 1.3 potassium 4.6 normal LFTs Total cholesterol 105 LDL 54 TSH 1.68  EKG personally reviewed by myself on todays visit Shows normal sinus rhythm rate 75 bpm consider old inferior MI  Medical history reviewed Cath  10/06/2017 showed Occluded PDA at the ostium, small vessel Moderate disease of OM1 proximal region, Moderate to severe disease of proximal PL branch,  Moderate disease of ostial/proximal diagonal branch #2,  Mild diffuse disease of LAD. Medical management was recommended of his residual disease, no intervention performed Occluded PDA has collaterals, diffusely diseased likely with thrombus.  He was started on aspirin Plavix, statin Already on beta-blocker  Echocardiogram result reviewed with him showing normal LV function   PMH:   has a past medical history of Cancer ([place]), Cancer ([place]), Hypertension, Kidney  stones, and Loss of equilibrium.  PSH:    Past Surgical History:  Procedure Laterality Date  . BLADDER SURGERY  2012   Dr. Madelin Headings, [facility]  . BRAIN SURGERY Left 2005   Brian Tumor (stem), [facility]  . CHOLECYSTECTOMY    . CYSTOSCOPY WITH RETROGRADE URETHROGRAM  03/16/2016   Procedure: CYSTOSCOPY WITH RETROGRADE URETHROGRAM;  Surgeon: Hollice Espy, MD;  Location: [facility] ORS;  Service: Urology;;  . Consuela Mimes WITH STENT PLACEMENT Left 02/23/2016   Procedure: CYSTOSCOPY WITH STENT PLACEMENT;  Surgeon: Dereck Leep, MD;  Location: [facility] ORS;  Service: Urology;  Laterality: Left;  . CYSTOSCOPY WITH STENT PLACEMENT Left 03/16/2016   Procedure: CYSTOSCOPY WITH STENT PLACEMENT/EXCHANGE;  Surgeon: Hollice Espy, MD;  Location: [facility] ORS;  Service: Urology;  Laterality: Left;  . LEFT HEART CATH AND CORONARY ANGIOGRAPHY N/A 10/06/2017   Procedure: LEFT HEART CATH AND CORONARY ANGIOGRAPHY;  Surgeon: Minna Merritts, MD;  Location: [place] [facility];  Service: Cardiovascular;  Laterality: N/A;  . MELANOMA EXCISION    . PROSTATECTOMY  2003   Dr. Madelin Headings, [facility]  . STONE EXTRACTION WITH BASKET  03/16/2016   Procedure: STONE EXTRACTION WITH BASKET;  Surgeon: Hollice Espy, MD;  Location: [facility] ORS;  Service: Urology;;    Current Outpatient Medications  Medication Sig Dispense Refill  . amLODipine (NORVASC) 10 MG tablet TAKE ONE TABLET BY MOUTH EVERY DAY 90 tablet 0  . aspirin EC 81 MG EC tablet Take 1 tablet (81 mg total) by mouth daily. 30 tablet 0  . atenolol (TENORMIN) 25 MG tablet Take 1 tablet (25 mg total) by mouth daily. 90 tablet 4  . atorvastatin (LIPITOR) 40 MG tablet  Take 1 tablet (40 mg total) by mouth daily at 6 PM. 90 tablet 4  . clopidogrel (PLAVIX) 75 MG tablet TAKE ONE TABLET BY MOUTH EVERY DAY 90 tablet 3  . fesoterodine (TOVIAZ) 8 MG TB24 tablet Take 1 tablet (8 mg total) by mouth daily. 90 each 3  . hydroxypropyl methylcellulose / hypromellose (ISOPTO TEARS /  GONIOVISC) 2.5 % ophthalmic solution Place 1 drop into both eyes as needed for dry eyes.    . nitroGLYCERIN (NITROSTAT) 0.4 MG SL tablet Place 1 tablet (0.4 mg total) under the tongue every 5 (five) minutes as needed for chest pain (hold for blood pressure less than 90). 30 tablet 0  . olmesartan (BENICAR) 40 MG tablet Take 1 tablet (40 mg total) by mouth daily. 30 tablet 11  . hydrochlorothiazide (HYDRODIURIL) 25 MG tablet Take 1 tablet (25 mg total) by mouth daily. 90 tablet 3   No current facility-administered medications for this visit.      Allergies:   Iodine and Shellfish allergy   Social History:  The patient  reports that he has never smoked. He has never used smokeless tobacco. He reports that he does not drink alcohol or use drugs.   Family History:   family history includes CAD in his father; Polycystic kidney disease in his father.    Review of Systems: Review of Systems  Constitutional: Negative.   Respiratory: Negative.   Cardiovascular: Negative.        Trace ankle swelling  Gastrointestinal: Negative.   Musculoskeletal: Negative.   Neurological: Negative.   Psychiatric/Behavioral: Negative.   All other systems reviewed and are negative.   PHYSICAL EXAM: VS:  BP 132/72 (BP Location: Left Arm, Patient Position: Sitting, Cuff Size: Normal)   Pulse 75   Ht 5\' 8"  (1.727 m)   Wt 207 lb (93.9 kg)   BMI 31.47 kg/m  , BMI Body mass index is 31.47 kg/m. GEN: Well nourished, well developed, in no acute distress  HEENT: normal  Neck: no JVD, carotid bruits, or masses Cardiac: RRR; no murmurs, rubs, or gallops,no edema  Respiratory:  clear to auscultation bilaterally, normal work of breathing GI: soft, nontender, nondistended, + BS MS: no deformity or atrophy  Skin: warm and dry, no rash Neuro:  Strength and sensation are intact Psych: euthymic mood, full affect   Recent Labs: No results found for requested labs within last 8760 hours.    Lipid Panel Lab  Results  Component Value Date   CHOL 167 10/06/2017   HDL 37 (L) 10/06/2017   LDLCALC 104 (H) 10/06/2017   TRIG 128 10/06/2017      Wt Readings from Last 3 Encounters:  11/14/18 207 lb (93.9 kg)  04/29/18 205 lb (93 kg)  10/15/17 207 lb (93.9 kg)       ASSESSMENT AND PLAN:  Coronary artery disease of native artery of native heart with stable angina pectoris [facility]) Catheterization details reviewed with him again He would like to stop Plavix He will keep it for now but will wean off Recommend he stay on aspirin  Essential hypertension Unable to afford triple combination pill, now taking amlodipine olmesartan HCTZ separately Recommended he move some to the evening as blood pressure sometimes run low Discussed shortage of ARB's Sometimes pharmacy does not stock on losartan  Mixed hyperlipidemia Recommend he stay on Lipitor 40 mg daily, goal LDL less than 70 Numbers doing fantastic, reviewed with him  Non-ST elevation (NSTEMI) myocardial infarction ([place])  non-STEMI, he has completed Plavix  for 1 year occluded PDA Stay on aspirin, he would like to wean off Plavix  Disposition:   F/U  12 months   Total encounter time more than 25 minutes  Greater than 50% was spent in counseling and coordination of care with the patient    Orders Placed This Encounter  Procedures  . EKG 12-Lead     Signed, Esmond Plants, M.D., Ph.D. 11/14/2018  [place], [place]

## 2018-11-14 ENCOUNTER — Ambulatory Visit: Payer: Medicare HMO | Admitting: Cardiovascular Disease

## 2018-11-14 ENCOUNTER — Encounter: Payer: Self-pay | Admitting: Cardiovascular Disease

## 2018-11-14 VITALS — BP 132/72 | HR 75 | Ht 68.0 in | Wt 207.0 lb

## 2018-11-14 DIAGNOSIS — I2510 Atherosclerotic heart disease of native coronary artery without angina pectoris: Secondary | ICD-10-CM | POA: Insufficient documentation

## 2018-11-14 DIAGNOSIS — E782 Mixed hyperlipidemia: Secondary | ICD-10-CM | POA: Diagnosis not present

## 2018-11-14 DIAGNOSIS — I10 Essential (primary) hypertension: Secondary | ICD-10-CM | POA: Diagnosis not present

## 2018-11-14 DIAGNOSIS — I25118 Atherosclerotic heart disease of native coronary artery with other forms of angina pectoris: Secondary | ICD-10-CM | POA: Diagnosis not present

## 2018-11-14 NOTE — Patient Instructions (Addendum)
Medication Instructions:  Ok to stop plavix  Stay on asa  Move 1 to 2 of your blood pressure meds to the PM Keep HCTZ in the morning  If you need a refill on your cardiac medications before your next appointment, please call your pharmacy.   Lab work: No new labs needed  If you have labs (blood work) drawn today and your tests are completely normal, you will receive your results only by: Marland Kitchen MyChart Message (if you have MyChart) OR . A paper copy in the mail If you have any lab test that is abnormal or we need to change your treatment, we will call you to review the results.   Testing/Procedures: No new testing needed   Follow-Up: At [facility], you and your health needs are our priority.  As part of our continuing mission to provide you with exceptional heart care, we have created designated Provider Care Teams.  These Care Teams include your primary Cardiologist (physician) and Advanced Practice Providers (APPs -  Physician Assistants and Nurse Practitioners) who all work together to provide you with the care you need, when you need it.  . You will need a follow up appointment in 12 months .   Please call our office 2 months in advance to schedule this appointment.    . Providers on your designated Care Team:   . Murray Hodgkins, NP . Christell Faith, PA-C . Marrianne Mood, PA-C  Any Other Special Instructions Will Be Listed Below (If Applicable).  For educational health videos Log in to : [email or web address] Or : [email or web address], password : [email or web address]

## 2018-11-23 ENCOUNTER — Other Ambulatory Visit: Payer: Self-pay | Admitting: Cardiovascular Disease

## 2018-12-14 DIAGNOSIS — G4733 Obstructive sleep apnea (adult) (pediatric): Secondary | ICD-10-CM | POA: Diagnosis not present

## 2018-12-19 ENCOUNTER — Other Ambulatory Visit: Payer: Self-pay | Admitting: Cardiovascular Disease

## 2018-12-21 ENCOUNTER — Other Ambulatory Visit: Payer: Self-pay | Admitting: Cardiovascular Disease

## 2019-01-08 ENCOUNTER — Other Ambulatory Visit: Payer: Self-pay | Admitting: Cardiovascular Disease

## 2019-01-18 DIAGNOSIS — D333 Benign neoplasm of cranial nerves: Secondary | ICD-10-CM | POA: Diagnosis not present

## 2019-01-18 DIAGNOSIS — Z0000 Encounter for general adult medical examination without abnormal findings: Secondary | ICD-10-CM | POA: Diagnosis not present

## 2019-01-18 DIAGNOSIS — I10 Essential (primary) hypertension: Secondary | ICD-10-CM | POA: Diagnosis not present

## 2019-01-18 DIAGNOSIS — C61 Malignant neoplasm of prostate: Secondary | ICD-10-CM | POA: Diagnosis not present

## 2019-01-18 DIAGNOSIS — I2510 Atherosclerotic heart disease of native coronary artery without angina pectoris: Secondary | ICD-10-CM | POA: Diagnosis not present

## 2019-01-25 DIAGNOSIS — I10 Essential (primary) hypertension: Secondary | ICD-10-CM | POA: Diagnosis not present

## 2019-01-25 DIAGNOSIS — I2510 Atherosclerotic heart disease of native coronary artery without angina pectoris: Secondary | ICD-10-CM | POA: Diagnosis not present

## 2019-01-25 DIAGNOSIS — J302 Other seasonal allergic rhinitis: Secondary | ICD-10-CM | POA: Diagnosis not present

## 2019-01-25 DIAGNOSIS — E786 Lipoprotein deficiency: Secondary | ICD-10-CM | POA: Diagnosis not present

## 2019-01-25 DIAGNOSIS — Z85820 Personal history of malignant melanoma of skin: Secondary | ICD-10-CM | POA: Diagnosis not present

## 2019-01-25 DIAGNOSIS — C61 Malignant neoplasm of prostate: Secondary | ICD-10-CM | POA: Diagnosis not present

## 2019-01-25 DIAGNOSIS — Z0000 Encounter for general adult medical examination without abnormal findings: Secondary | ICD-10-CM | POA: Diagnosis not present

## 2019-01-25 DIAGNOSIS — D333 Benign neoplasm of cranial nerves: Secondary | ICD-10-CM | POA: Diagnosis not present

## 2019-02-06 DIAGNOSIS — L570 Actinic keratosis: Secondary | ICD-10-CM | POA: Diagnosis not present

## 2019-02-06 DIAGNOSIS — L578 Other skin changes due to chronic exposure to nonionizing radiation: Secondary | ICD-10-CM | POA: Diagnosis not present

## 2019-02-06 DIAGNOSIS — L820 Inflamed seborrheic keratosis: Secondary | ICD-10-CM | POA: Diagnosis not present

## 2019-02-17 DIAGNOSIS — H40003 Preglaucoma, unspecified, bilateral: Secondary | ICD-10-CM | POA: Diagnosis not present

## 2019-05-02 ENCOUNTER — Other Ambulatory Visit: Payer: Self-pay

## 2019-05-02 ENCOUNTER — Telehealth (INDEPENDENT_AMBULATORY_CARE_PROVIDER_SITE_OTHER): Payer: Medicare HMO | Admitting: Urology

## 2019-05-02 DIAGNOSIS — N200 Calculus of kidney: Secondary | ICD-10-CM

## 2019-05-02 DIAGNOSIS — N393 Stress incontinence (female) (male): Secondary | ICD-10-CM

## 2019-05-02 DIAGNOSIS — Z8546 Personal history of malignant neoplasm of prostate: Secondary | ICD-10-CM

## 2019-05-02 DIAGNOSIS — N3281 Overactive bladder: Secondary | ICD-10-CM

## 2019-05-02 MED ORDER — FESOTERODINE FUMARATE ER 8 MG PO TB24: 8.0000 mg | ORAL_TABLET | Freq: Every day | ORAL | 3 refills | Status: DC

## 2019-05-02 NOTE — Progress Notes (Signed)
Virtual Visit via Video Note  I connected with Gregory [name] on 05/02/19 at 10:00 AM EDT by a video enabled telemedicine application and verified that I am speaking with the correct person using two identifiers.  Location: Patient: home Provider: home   I discussed the limitations of evaluation and management by telemedicine and the availability of in person appointments. The patient expressed understanding and agreed to proceed.  History of Present Illness: 78 year old male with multiple GU issues who presents today for routine annual follow-up.  No changes on over all health this year.    History of kidney stonesstone  Last KUB 04/2017 negative for stones s/p L URS 02/2016 for4 mm left midureteral stone, 3 mm nonobstructing L stone  [id number] previous remote stone episodes which has not required surgical intervention Stone analysis 97% calcium oxalate, 3% calcium phosphate No flank pain,gross hematuria, or any stone episodes in the past year- unchanged  History of benign bladder growth TURBT in 2012, PUNLMP Previously followed by cystoscopy annually, last 1 year agoat time of URS (2017) UA 12/2018 without blood, unremarkable  History of prostate cancer S/p open radical prostatectomy 2013 PSA 06/2018 0.02 Missed PSA this year, scheduled to repeat in July with PCP  SUI/ urinary urgency Rare leakage with movement Minimal bother Doing well on Toviaz for urinary frequency- somewhat helpful Improves some frequency/ urgency- every 2.5-3 hours, same at night and gets more warning to get to the bathroom No urge incontiennce Minimal side effects- mild constipation which managed  Observations/Objective: No acute distress  Assessment and Plan:  1. History of nephrolithiasis Asymptomatic No stone episodes over past 12 months Follow clinically Encouraged hydration  2. OAB (overactive bladder) Doing well on Toviaz 8 mg daily Refilled x 1 year UA negative, no  blood  3. History of prostate cancer PSA very low, recently 0.02 Will be rechecked 06/2019 next year  4. SUI (stress urinary incontinence), male Minimal Encouraged pelvic floor exercises  Follow Up Instructions: 1 year with UA/ IPSS/ PSA (may be done by PCP)   I discussed the assessment and treatment plan with the patient. The patient was provided an opportunity to ask questions and all were answered. The patient agreed with the plan and demonstrated an understanding of the instructions.   The patient was advised to call back or seek an in-person evaluation if the symptoms worsen or if the condition fails to improve as anticipated.  I provided 15 minutes of non-face-to-face time during this encounter.   Hollice Espy, MD

## 2019-06-20 DIAGNOSIS — G4733 Obstructive sleep apnea (adult) (pediatric): Secondary | ICD-10-CM | POA: Diagnosis not present

## 2019-06-20 DIAGNOSIS — H6123 Impacted cerumen, bilateral: Secondary | ICD-10-CM | POA: Diagnosis not present

## 2019-07-19 DIAGNOSIS — I2510 Atherosclerotic heart disease of native coronary artery without angina pectoris: Secondary | ICD-10-CM | POA: Diagnosis not present

## 2019-07-19 DIAGNOSIS — C61 Malignant neoplasm of prostate: Secondary | ICD-10-CM | POA: Diagnosis not present

## 2019-07-19 DIAGNOSIS — I10 Essential (primary) hypertension: Secondary | ICD-10-CM | POA: Diagnosis not present

## 2019-07-19 DIAGNOSIS — D333 Benign neoplasm of cranial nerves: Secondary | ICD-10-CM | POA: Diagnosis not present

## 2019-07-19 DIAGNOSIS — Z125 Encounter for screening for malignant neoplasm of prostate: Secondary | ICD-10-CM | POA: Diagnosis not present

## 2019-07-19 DIAGNOSIS — Z0000 Encounter for general adult medical examination without abnormal findings: Secondary | ICD-10-CM | POA: Diagnosis not present

## 2019-07-19 DIAGNOSIS — Z85820 Personal history of malignant melanoma of skin: Secondary | ICD-10-CM | POA: Diagnosis not present

## 2019-07-19 DIAGNOSIS — J302 Other seasonal allergic rhinitis: Secondary | ICD-10-CM | POA: Diagnosis not present

## 2019-07-21 DIAGNOSIS — J343 Hypertrophy of nasal turbinates: Secondary | ICD-10-CM | POA: Diagnosis not present

## 2019-07-21 DIAGNOSIS — J3489 Other specified disorders of nose and nasal sinuses: Secondary | ICD-10-CM | POA: Diagnosis not present

## 2019-07-21 DIAGNOSIS — G4733 Obstructive sleep apnea (adult) (pediatric): Secondary | ICD-10-CM | POA: Diagnosis not present

## 2019-07-21 DIAGNOSIS — J310 Chronic rhinitis: Secondary | ICD-10-CM | POA: Diagnosis not present

## 2019-07-21 DIAGNOSIS — J342 Deviated nasal septum: Secondary | ICD-10-CM | POA: Diagnosis not present

## 2019-07-25 DIAGNOSIS — G4733 Obstructive sleep apnea (adult) (pediatric): Secondary | ICD-10-CM | POA: Insufficient documentation

## 2019-07-25 DIAGNOSIS — J342 Deviated nasal septum: Secondary | ICD-10-CM | POA: Insufficient documentation

## 2019-07-25 DIAGNOSIS — J3489 Other specified disorders of nose and nasal sinuses: Secondary | ICD-10-CM | POA: Insufficient documentation

## 2019-07-26 DIAGNOSIS — R739 Hyperglycemia, unspecified: Secondary | ICD-10-CM | POA: Diagnosis not present

## 2019-07-26 DIAGNOSIS — N289 Disorder of kidney and ureter, unspecified: Secondary | ICD-10-CM | POA: Diagnosis not present

## 2019-07-26 DIAGNOSIS — D333 Benign neoplasm of cranial nerves: Secondary | ICD-10-CM | POA: Diagnosis not present

## 2019-07-26 DIAGNOSIS — I10 Essential (primary) hypertension: Secondary | ICD-10-CM | POA: Diagnosis not present

## 2019-07-26 DIAGNOSIS — Z85820 Personal history of malignant melanoma of skin: Secondary | ICD-10-CM | POA: Diagnosis not present

## 2019-07-26 DIAGNOSIS — I2510 Atherosclerotic heart disease of native coronary artery without angina pectoris: Secondary | ICD-10-CM | POA: Diagnosis not present

## 2019-07-26 DIAGNOSIS — C61 Malignant neoplasm of prostate: Secondary | ICD-10-CM | POA: Diagnosis not present

## 2019-08-18 DIAGNOSIS — H40003 Preglaucoma, unspecified, bilateral: Secondary | ICD-10-CM | POA: Diagnosis not present

## 2019-08-25 DIAGNOSIS — H40003 Preglaucoma, unspecified, bilateral: Secondary | ICD-10-CM | POA: Diagnosis not present

## 2019-09-08 DIAGNOSIS — Z23 Encounter for immunization: Secondary | ICD-10-CM | POA: Diagnosis not present

## 2019-09-20 ENCOUNTER — Other Ambulatory Visit: Payer: Self-pay | Admitting: Cardiovascular Disease

## 2019-09-27 ENCOUNTER — Other Ambulatory Visit: Payer: Self-pay | Admitting: Cardiovascular Disease

## 2019-10-02 ENCOUNTER — Other Ambulatory Visit: Payer: Self-pay | Admitting: Cardiovascular Disease

## 2019-10-18 ENCOUNTER — Telehealth: Payer: Self-pay | Admitting: Cardiovascular Disease

## 2019-10-18 NOTE — Telephone Encounter (Addendum)
Called patient  Gregory [name] and moved his 11/14/19 appointment with Dr. Rockey Situ to 10/23/29 at 10:20AM. Patient verbalized an understanding and all if any questions were answered.

## 2019-10-18 NOTE — Telephone Encounter (Signed)
° °  [facility] Pre-operative Risk Assessment    Request for surgical clearance:  1. What type of surgery is being performed? Nasal reconstruction, functional rhinoplasty, resection of inferior turbinates, cartilage graft to nose    2. When is this surgery scheduled? 11/02/19   3. What type of clearance is required (medical clearance vs. Pharmacy clearance to hold med vs. Both)? both   4. Are there any medications that need to be held prior to surgery and how long? Dr Carlis Abbott prefers patient to be off blood thinners 2 days prior to surgery and 5 days post. Plavix 75 MG - Aspirin 81 MG   5. Practice name and name of physician performing surgery? [facility], Dr Janalee Dane   6. What is your office phone number [phone]    7.   What is your office fax number [phone]  8.   Anesthesia type (None, local, MAC, general) ? None listed    Gregory [name] 10/18/2019, 11:57 AM  _________________________________________________________________   (provider comments below)

## 2019-10-18 NOTE — Telephone Encounter (Signed)
   Primary Cardiologist:Timothy Rockey Situ, MD  Chart reviewed as part of pre-operative protocol coverage. Because of Gregory [name]'s past medical history and time since last visit, he will require a follow-up visit in order to better assess preoperative cardiovascular risk.  Pre-op covering staff: - Please schedule appointment and call patient to inform them.  He has an appt with Dr. Rockey Situ 11/24 but his surgery is scheduled 11/12.  Please move appt with Dr. Rockey Situ or APP to a sooner date. - Please contact requesting surgeon's office via preferred method (i.e, phone, fax) to inform them of need for appointment prior to surgery.   Richardson Dopp, PA-C  10/18/2019, 12:18 PM

## 2019-10-22 NOTE — Progress Notes (Signed)
Cardiology Office Note  Date:  10/24/2019   ID:  Gregory [name], DOB 10-Jan-1941, MRN [id number]  PCP:  Tracie Harrier, MD   Chief Complaint  Patient presents with  . Other    12 month follow up. Patient needs cardiac clearance. Patient denies chest pain and SOB. Meds reviewed verbally with patient.     HPI:  78 year old gentleman with  CAD on cardiac catheterization October 2018 Occluded PDA, collaterals, medical management recommended morbid obesity,  prostate cancer status post resection,  essential hypertension  who denies any prior cardiac history,  Evaluated for chest pain in the hospital October 06, 2017 cardiac catheterization  He presents for follow-up of his coronary artery disease  Balance issue Having some dizziness BP sometimes [id number] systolic when sitting  No exercise, seems to drift off to the left when standing up  Lab work reviewed CR 1.4, BUN 27 in July 2020 Total chol 111, LDL 52  Scheduled for sinus surgery next week  Sedentary, No edema No chest pain Mild orthopedic issues  EKG personally reviewed by myself on todays visit Shows normal sinus rhythm rate 65 bpm consider old inferior MI  Medical history reviewed Cath  10/06/2017 showed Occluded PDA at the ostium, small vessel Moderate disease of OM1 proximal region, Moderate to severe disease of proximal PL branch,  Moderate disease of ostial/proximal diagonal branch #2,  Mild diffuse disease of LAD. Medical management was recommended of his residual disease, no intervention performed Occluded PDA has collaterals, diffusely diseased likely with thrombus.  He was started on aspirin Plavix, statin Already on beta-blocker  Echocardiogram normal LV function   PMH:   has a past medical history of Cancer ([place]), Cancer ([place]), Hypertension, Kidney stones, and Loss of equilibrium.  PSH:    Past Surgical History:  Procedure Laterality Date  . BLADDER SURGERY  2012   Dr. Madelin Headings, [facility]  .  BRAIN SURGERY Left 2005   Brian Tumor (stem), [facility]  . CHOLECYSTECTOMY    . CYSTOSCOPY WITH RETROGRADE URETHROGRAM  03/16/2016   Procedure: CYSTOSCOPY WITH RETROGRADE URETHROGRAM;  Surgeon: Hollice Espy, MD;  Location: [facility] ORS;  Service: Urology;;  . Consuela Mimes WITH STENT PLACEMENT Left 02/23/2016   Procedure: CYSTOSCOPY WITH STENT PLACEMENT;  Surgeon: Dereck Leep, MD;  Location: [facility] ORS;  Service: Urology;  Laterality: Left;  . CYSTOSCOPY WITH STENT PLACEMENT Left 03/16/2016   Procedure: CYSTOSCOPY WITH STENT PLACEMENT/EXCHANGE;  Surgeon: Hollice Espy, MD;  Location: [facility] ORS;  Service: Urology;  Laterality: Left;  . LEFT HEART CATH AND CORONARY ANGIOGRAPHY N/A 10/06/2017   Procedure: LEFT HEART CATH AND CORONARY ANGIOGRAPHY;  Surgeon: Minna Merritts, MD;  Location: [place] [facility];  Service: Cardiovascular;  Laterality: N/A;  . MELANOMA EXCISION    . PROSTATECTOMY  2003   Dr. Madelin Headings, [facility]  . STONE EXTRACTION WITH BASKET  03/16/2016   Procedure: STONE EXTRACTION WITH BASKET;  Surgeon: Hollice Espy, MD;  Location: [facility] ORS;  Service: Urology;;    Current Outpatient Medications  Medication Sig Dispense Refill  . amLODipine (NORVASC) 10 MG tablet Take 1 tablet (10 mg total) by mouth daily. Please call [phone] to schedule office visit for further refills. Thank you! 90 tablet 0  . aspirin EC 81 MG EC tablet Take 1 tablet (81 mg total) by mouth daily. 30 tablet 0  . atenolol (TENORMIN) 25 MG tablet TAKE ONE TABLET BY MOUTH EVERY DAY 90 tablet 4  . atorvastatin (LIPITOR) 40 MG tablet TAKE 1 TABLET BY  MOUTH DAILY AT 6PM 90 tablet 0  . fesoterodine (TOVIAZ) 8 MG TB24 tablet Take 1 tablet (8 mg total) by mouth daily. 90 each 3  . hydroxypropyl methylcellulose / hypromellose (ISOPTO TEARS / GONIOVISC) 2.5 % ophthalmic solution Place 1 drop into both eyes as needed for dry eyes.    . nitroGLYCERIN (NITROSTAT) 0.4 MG SL tablet Place 1 tablet (0.4 mg total)  under the tongue every 5 (five) minutes as needed for chest pain (hold for blood pressure less than 90). 30 tablet 0  . olmesartan (BENICAR) 40 MG tablet TAKE ONE TABLET BY MOUTH EVERY DAY 90 tablet 3  . hydrochlorothiazide (HYDRODIURIL) 25 MG tablet Take 1 tablet (25 mg total) by mouth daily. 90 tablet 3   No current facility-administered medications for this visit.      Allergies:   Iodine and Shellfish allergy   Social History:  The patient  reports that he has never smoked. He has never used smokeless tobacco. He reports that he does not drink alcohol or use drugs.   Family History:   family history includes CAD in his father; Polycystic kidney disease in his father.    Review of Systems: Review of Systems  Constitutional: Negative.   HENT: Negative.   Respiratory: Negative.   Cardiovascular: Negative.   Gastrointestinal: Negative.   Musculoskeletal: Negative.        Gait instability  Neurological: Negative.  Negative for loss of consciousness.  Psychiatric/Behavioral: Negative.   All other systems reviewed and are negative.   PHYSICAL EXAM: VS:  BP 124/68 (BP Location: Right Arm, Patient Position: Sitting, Cuff Size: Normal)   Pulse 65   Ht 5\' 8"  (1.727 m)   Wt 207 lb (93.9 kg)   BMI 31.47 kg/m  , BMI Body mass index is 31.47 kg/m. Constitutional:  oriented to person, place, and time. No distress.  HENT:  Head: Grossly normal Eyes:  no discharge. No scleral icterus.  Neck: No JVD, no carotid bruits  Cardiovascular: Regular rate and rhythm, no murmurs appreciated Pulmonary/Chest: Clear to auscultation bilaterally, no wheezes or rails Abdominal: Soft.  no distension.  no tenderness.  Musculoskeletal: Normal range of motion Neurological:  normal muscle tone. Coordination normal. No atrophy Skin: Skin warm and dry Psychiatric: normal affect, pleasant  Recent Labs: No results found for requested labs within last 8760 hours.    Lipid Panel Lab Results  Component  Value Date   CHOL 167 10/06/2017   HDL 37 (L) 10/06/2017   LDLCALC 104 (H) 10/06/2017   TRIG 128 10/06/2017      Wt Readings from Last 3 Encounters:  10/24/19 207 lb (93.9 kg)  11/14/18 207 lb (93.9 kg)  04/29/18 205 lb (93 kg)     ASSESSMENT AND PLAN:  Coronary artery disease of native artery of native heart with stable angina pectoris (HCC) Currently with no symptoms of angina. No further workup at this time. Continue current medication regimen. Stable  Essential hypertension BP low, having dizziness, Will change HCTZ to three times a week CR elevated 1.4, prerenal May need to decrease amlodipine if blood pressure continues to run low  Mixed hyperlipidemia Cholesterol is at goal on the current lipid regimen. No changes to the medications were made.  Stable  Non-ST elevation (NSTEMI) myocardial infarction (HCC)  non-STEMI, he has completed Plavix for 1 year occluded PDA Stay on aspirin, stable No further testing needed.  Denies anginal symptoms  Disposition:   F/U  12 months   Total encounter time  more than 25 minutes  Greater than 50% was spent in counseling and coordination of care with the patient    No orders of the defined types were placed in this encounter.    Signed, Esmond Plants, M.D., Ph.D. 10/24/2019  [place], [place]

## 2019-10-24 ENCOUNTER — Encounter: Payer: Self-pay | Admitting: Cardiovascular Disease

## 2019-10-24 ENCOUNTER — Ambulatory Visit (INDEPENDENT_AMBULATORY_CARE_PROVIDER_SITE_OTHER): Payer: Medicare HMO | Admitting: Cardiovascular Disease

## 2019-10-24 ENCOUNTER — Other Ambulatory Visit: Payer: Self-pay

## 2019-10-24 VITALS — BP 124/68 | HR 65 | Ht 68.0 in | Wt 207.0 lb

## 2019-10-24 DIAGNOSIS — I25118 Atherosclerotic heart disease of native coronary artery with other forms of angina pectoris: Secondary | ICD-10-CM | POA: Diagnosis not present

## 2019-10-24 DIAGNOSIS — I10 Essential (primary) hypertension: Secondary | ICD-10-CM | POA: Diagnosis not present

## 2019-10-24 DIAGNOSIS — E782 Mixed hyperlipidemia: Secondary | ICD-10-CM

## 2019-10-24 MED ORDER — HYDROCHLOROTHIAZIDE 25 MG PO TABS: 25.0000 mg | ORAL_TABLET | ORAL | 3 refills | Status: DC

## 2019-10-24 NOTE — Patient Instructions (Signed)
Medication Instructions:  Please decrease the HCTZ down to three times a week Monitor blood pressure If it runs low, we may need to decrease the medications   If you need a refill on your cardiac medications before your next appointment, please call your pharmacy.    Lab work: No new labs needed   If you have labs (blood work) drawn today and your tests are completely normal, you will receive your results only by: Marland Kitchen MyChart Message (if you have MyChart) OR . A paper copy in the mail If you have any lab test that is abnormal or we need to change your treatment, we will call you to review the results.   Testing/Procedures: No new testing needed   Follow-Up: At [facility], you and your health needs are our priority.  As part of our continuing mission to provide you with exceptional heart care, we have created designated Provider Care Teams.  These Care Teams include your primary Cardiologist (physician) and Advanced Practice Providers (APPs -  Physician Assistants and Nurse Practitioners) who all work together to provide you with the care you need, when you need it.  . You will need a follow up appointment in 12 months .   Please call our office 2 months in advance to schedule this appointment.    . Providers on your designated Care Team:   . Murray Hodgkins, NP . Christell Faith, PA-C . Marrianne Mood, PA-C  Any Other Special Instructions Will Be Listed Below (If Applicable).  For educational health videos Log in to : [email or web address] Or : [email or web address], password : [email or web address]

## 2019-10-26 NOTE — Telephone Encounter (Signed)
Surgeon office calling to check status of clearance request.

## 2019-10-26 NOTE — Telephone Encounter (Signed)
I am going to route this to Pre OP Provider Kathyrn Drown, NP and Dr. Rockey Situ to clarify if pt is cleared. I did not see in Dr. Phillis Knack note stating pt is cleared for surgery.

## 2019-10-28 NOTE — Telephone Encounter (Signed)
He is only on aspirin 81 mg daily Plavix has been held Acceptable risk for procedure He does have severe underlying coronary disease, certainly a risk coming off the aspirin If he is willing to accept the risk, okay to stop aspirin 2 days prior, 5 days after if that is what ENT needs then restart low-dose aspirin

## 2019-10-30 NOTE — Telephone Encounter (Signed)
I will route clearance notes to surgeon's office with recommendations. I will remove from the pre op call back pool.

## 2019-10-30 NOTE — Telephone Encounter (Signed)
   Primary Cardiologist: Ida Rogue, MD  Chart reviewed as part of pre-operative protocol coverage. Given past medical history and time since last visit, based on ACC/AHA guidelines, Gregory [name] would be at acceptable risk for the planned procedure without further cardiovascular testing.   Pt was seen by Dr. Rockey Situ on 10/24/2019. He was stable from a cardiac perspective with no anginal symptoms and no further cardiac workup needed at that time. EKG was stable per chart review.   Per Dr. Lovena Le, the patient is only on aspirin 81 mg daily. Plavix has been held.  He is at higher but acceptable risk for procedure. He does have severe underlying coronary disease, certainly a risk coming off the aspirin. If he is willing to accept the risk, okay to stop aspirin 2 days prior, 5 days after if that is what ENT needs then restart low-dose aspirin.   I will route this recommendation to the requesting party via Epic fax function and remove from pre-op pool.  Please call with questions.  Kathyrn Drown, NP 10/30/2019, 8:27 AM

## 2019-10-31 DIAGNOSIS — Z20828 Contact with and (suspected) exposure to other viral communicable diseases: Secondary | ICD-10-CM | POA: Diagnosis not present

## 2019-10-31 DIAGNOSIS — I10 Essential (primary) hypertension: Secondary | ICD-10-CM | POA: Diagnosis not present

## 2019-10-31 DIAGNOSIS — I252 Old myocardial infarction: Secondary | ICD-10-CM | POA: Diagnosis not present

## 2019-10-31 DIAGNOSIS — Z01812 Encounter for preprocedural laboratory examination: Secondary | ICD-10-CM | POA: Diagnosis not present

## 2019-10-31 DIAGNOSIS — Z01818 Encounter for other preprocedural examination: Secondary | ICD-10-CM | POA: Diagnosis not present

## 2019-10-31 DIAGNOSIS — I2510 Atherosclerotic heart disease of native coronary artery without angina pectoris: Secondary | ICD-10-CM | POA: Diagnosis not present

## 2019-10-31 DIAGNOSIS — I440 Atrioventricular block, first degree: Secondary | ICD-10-CM | POA: Diagnosis not present

## 2019-10-31 DIAGNOSIS — G4733 Obstructive sleep apnea (adult) (pediatric): Secondary | ICD-10-CM | POA: Diagnosis not present

## 2019-10-31 DIAGNOSIS — R001 Bradycardia, unspecified: Secondary | ICD-10-CM | POA: Diagnosis not present

## 2019-11-02 DIAGNOSIS — I2510 Atherosclerotic heart disease of native coronary artery without angina pectoris: Secondary | ICD-10-CM | POA: Diagnosis not present

## 2019-11-02 DIAGNOSIS — J342 Deviated nasal septum: Secondary | ICD-10-CM | POA: Diagnosis not present

## 2019-11-02 DIAGNOSIS — I10 Essential (primary) hypertension: Secondary | ICD-10-CM | POA: Diagnosis not present

## 2019-11-02 DIAGNOSIS — J309 Allergic rhinitis, unspecified: Secondary | ICD-10-CM | POA: Diagnosis not present

## 2019-11-02 DIAGNOSIS — G4733 Obstructive sleep apnea (adult) (pediatric): Secondary | ICD-10-CM | POA: Diagnosis not present

## 2019-11-02 DIAGNOSIS — J343 Hypertrophy of nasal turbinates: Secondary | ICD-10-CM | POA: Diagnosis not present

## 2019-11-02 DIAGNOSIS — E785 Hyperlipidemia, unspecified: Secondary | ICD-10-CM | POA: Diagnosis not present

## 2019-11-02 DIAGNOSIS — J3489 Other specified disorders of nose and nasal sinuses: Secondary | ICD-10-CM | POA: Diagnosis not present

## 2019-11-02 DIAGNOSIS — J329 Chronic sinusitis, unspecified: Secondary | ICD-10-CM | POA: Diagnosis not present

## 2019-11-03 DIAGNOSIS — J343 Hypertrophy of nasal turbinates: Secondary | ICD-10-CM | POA: Diagnosis not present

## 2019-11-03 DIAGNOSIS — J329 Chronic sinusitis, unspecified: Secondary | ICD-10-CM | POA: Diagnosis not present

## 2019-11-03 DIAGNOSIS — J342 Deviated nasal septum: Secondary | ICD-10-CM | POA: Diagnosis not present

## 2019-11-03 DIAGNOSIS — E785 Hyperlipidemia, unspecified: Secondary | ICD-10-CM | POA: Diagnosis not present

## 2019-11-03 DIAGNOSIS — J309 Allergic rhinitis, unspecified: Secondary | ICD-10-CM | POA: Diagnosis not present

## 2019-11-03 DIAGNOSIS — I2510 Atherosclerotic heart disease of native coronary artery without angina pectoris: Secondary | ICD-10-CM | POA: Diagnosis not present

## 2019-11-03 DIAGNOSIS — J3489 Other specified disorders of nose and nasal sinuses: Secondary | ICD-10-CM | POA: Diagnosis not present

## 2019-11-03 DIAGNOSIS — G4733 Obstructive sleep apnea (adult) (pediatric): Secondary | ICD-10-CM | POA: Diagnosis not present

## 2019-11-03 DIAGNOSIS — I10 Essential (primary) hypertension: Secondary | ICD-10-CM | POA: Diagnosis not present

## 2019-11-05 ENCOUNTER — Other Ambulatory Visit: Payer: Self-pay | Admitting: Cardiovascular Disease

## 2019-11-14 ENCOUNTER — Ambulatory Visit: Payer: Medicare HMO | Admitting: Cardiovascular Disease

## 2019-12-26 ENCOUNTER — Other Ambulatory Visit: Payer: Self-pay | Admitting: Cardiovascular Disease

## 2020-01-05 ENCOUNTER — Other Ambulatory Visit: Payer: Self-pay | Admitting: Cardiovascular Disease

## 2020-01-22 DIAGNOSIS — R739 Hyperglycemia, unspecified: Secondary | ICD-10-CM | POA: Diagnosis not present

## 2020-01-22 DIAGNOSIS — I2510 Atherosclerotic heart disease of native coronary artery without angina pectoris: Secondary | ICD-10-CM | POA: Diagnosis not present

## 2020-01-22 DIAGNOSIS — Z85820 Personal history of malignant melanoma of skin: Secondary | ICD-10-CM | POA: Diagnosis not present

## 2020-01-22 DIAGNOSIS — Z125 Encounter for screening for malignant neoplasm of prostate: Secondary | ICD-10-CM | POA: Diagnosis not present

## 2020-01-22 DIAGNOSIS — D333 Benign neoplasm of cranial nerves: Secondary | ICD-10-CM | POA: Diagnosis not present

## 2020-01-22 DIAGNOSIS — N289 Disorder of kidney and ureter, unspecified: Secondary | ICD-10-CM | POA: Diagnosis not present

## 2020-01-22 DIAGNOSIS — I10 Essential (primary) hypertension: Secondary | ICD-10-CM | POA: Diagnosis not present

## 2020-01-22 DIAGNOSIS — C61 Malignant neoplasm of prostate: Secondary | ICD-10-CM | POA: Diagnosis not present

## 2020-01-29 DIAGNOSIS — L570 Actinic keratosis: Secondary | ICD-10-CM | POA: Diagnosis not present

## 2020-01-29 DIAGNOSIS — M25552 Pain in left hip: Secondary | ICD-10-CM | POA: Diagnosis not present

## 2020-01-29 DIAGNOSIS — I10 Essential (primary) hypertension: Secondary | ICD-10-CM | POA: Diagnosis not present

## 2020-01-29 DIAGNOSIS — M25511 Pain in right shoulder: Secondary | ICD-10-CM | POA: Diagnosis not present

## 2020-01-29 DIAGNOSIS — Z23 Encounter for immunization: Secondary | ICD-10-CM | POA: Diagnosis not present

## 2020-01-29 DIAGNOSIS — Z85820 Personal history of malignant melanoma of skin: Secondary | ICD-10-CM | POA: Diagnosis not present

## 2020-01-29 DIAGNOSIS — I2510 Atherosclerotic heart disease of native coronary artery without angina pectoris: Secondary | ICD-10-CM | POA: Diagnosis not present

## 2020-01-29 DIAGNOSIS — Z0000 Encounter for general adult medical examination without abnormal findings: Secondary | ICD-10-CM | POA: Diagnosis not present

## 2020-01-29 DIAGNOSIS — N200 Calculus of kidney: Secondary | ICD-10-CM | POA: Diagnosis not present

## 2020-02-16 ENCOUNTER — Other Ambulatory Visit: Payer: Self-pay | Admitting: Cardiovascular Disease

## 2020-02-19 DIAGNOSIS — J310 Chronic rhinitis: Secondary | ICD-10-CM | POA: Diagnosis not present

## 2020-02-22 DIAGNOSIS — H40003 Preglaucoma, unspecified, bilateral: Secondary | ICD-10-CM | POA: Diagnosis not present

## 2020-03-13 ENCOUNTER — Other Ambulatory Visit: Payer: Self-pay | Admitting: Cardiovascular Disease

## 2020-03-15 ENCOUNTER — Other Ambulatory Visit: Payer: Self-pay | Admitting: Urology

## 2020-03-15 DIAGNOSIS — N3281 Overactive bladder: Secondary | ICD-10-CM

## 2020-04-30 NOTE — Progress Notes (Signed)
05/01/20 4:46 PM   Gregory [name] 02-01-1941 [id number]  Referring provider: Tracie Harrier, MD [address] [facility] [place],  [place] [address] Chief Complaint  Patient presents with  . Follow-up    1year w/IPSS    HPI: Gregory [name] is a 79 y.o. M with multiple GU issues who presents today for routine annual follow-up.  No changes on over all health this year.    History of kidney stonesstone Last KUB 04/2017 negative for stones s/p L URS 02/2016 for4 mm left midureteral stone, 3 mm nonobstructing L stone  [id number] previous remote stone episodes which has not required surgical intervention Stone analysis 97% calcium oxalate, 3% calcium phosphate No flank pain,gross hematuria, or any stone episodes in the past year- unchanged today  History of benign bladder growth TURBT in 2012, PUNLMP Previously followed by cystoscopy annually, lastat time of URS (2017) UA today unremarkable   History of prostate cancer S/p open radical prostatectomy 2013 PSA 0.01 on 01/2020, stable  SUI/ urinary urgency Rare leakage with movement, minimal bother from SUI Currently on Toviaz 8 mg for urinary frequency- working moderately well however expensive  Interested in cost effective drug  Urgency q 2 hours, same at night and gets more warning to get to the bathroom IPSS below  [place] Name 05/01/20 1000         International Prostate Symptom Score   How often have you had the sensation of not emptying your bladder?  Not at All     How often have you had to urinate less than every two hours?  Less than half the time     How often have you found you stopped and started again several times when you urinated?  Not at All     How often have you found it difficult to postpone urination?  About half the time     How often have you had a weak urinary stream?  Less than 1 in 5 times     How often have you had to strain to start urination?  Not at  All     How many times did you typically get up at night to urinate?  2 Times     Total IPSS Score  8       Quality of Life due to urinary symptoms   If you were to spend the rest of your life with your urinary condition just the way it is now how would you feel about that?  Mixed        Score:  1-7 Mild 8-19 Moderate 20-35 Severe  PMH: Past Medical History:  Diagnosis Date  . Cancer [facility])    Prostate  . Cancer ([place])    Melanoma  . Hypertension   . Kidney stones   . 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 BLADDER SURGERY  2012   Dr. Madelin Headings, [facility]  . BRAIN SURGERY Left 2005   Brian Tumor (stem), [facility]  . CHOLECYSTECTOMY    . CYSTOSCOPY WITH RETROGRADE URETHROGRAM  03/16/2016   Procedure: CYSTOSCOPY WITH RETROGRADE URETHROGRAM;  Surgeon: Hollice Espy, MD;  Location: [facility] ORS;  Service: Urology;;  . Consuela Mimes WITH STENT PLACEMENT Left 02/23/2016   Procedure: CYSTOSCOPY WITH STENT PLACEMENT;  Surgeon: Dereck Leep, MD;  Location: [facility] ORS;  Service: Urology;  Laterality: Left;  . CYSTOSCOPY WITH STENT PLACEMENT Left 03/16/2016   Procedure: CYSTOSCOPY WITH STENT PLACEMENT/EXCHANGE;  Surgeon: Hollice Espy, MD;  Location: [facility] ORS;  Service: Urology;  Laterality: Left;  . LEFT HEART CATH AND CORONARY ANGIOGRAPHY N/A 10/06/2017   Procedure: LEFT HEART CATH AND CORONARY ANGIOGRAPHY;  Surgeon: Minna Merritts, MD;  Location: [facility] CV LAB;  Service: Cardiovascular;  Laterality: N/A;  . MELANOMA EXCISION    . PROSTATECTOMY  2003   Dr. Madelin Headings, [facility]  . STONE EXTRACTION WITH BASKET  03/16/2016   Procedure: STONE EXTRACTION WITH BASKET;  Surgeon: Hollice Espy, MD;  Location: [facility] ORS;  Service: Urology;;    Home Medications:  Allergies as of 05/01/2020      Reactions   Iodine Anaphylaxis   Shellfish Allergy Anaphylaxis       Medication List       Accurate as of May 01, 2020  4:46 PM. If you have any questions, ask your nurse or doctor.        amLODipine 10 MG tablet Commonly known as: NORVASC Take 1 tablet (10 mg total) by mouth daily.   aspirin 81 MG EC tablet Take 1 tablet (81 mg total) by mouth daily.   atenolol 25 MG tablet Commonly known as: TENORMIN TAKE ONE TABLET BY MOUTH EVERY DAY   atorvastatin 40 MG tablet Commonly known as: LIPITOR TAKE 1 TABLET BY MOUTH DAILY AT 6PM   hydrochlorothiazide 25 MG tablet Commonly known as: HYDRODIURIL Take 1 tablet (25 mg total) by mouth 3 (three) times a week.   hydroxypropyl methylcellulose / hypromellose 2.5 % ophthalmic solution Commonly known as: ISOPTO TEARS / GONIOVISC Place 1 drop into both eyes as needed for dry eyes.   nitroGLYCERIN 0.4 MG SL tablet Commonly known as: NITROSTAT Place 1 tablet (0.4 mg total) under the tongue every 5 (five) minutes as needed for chest pain (hold for blood pressure less than 90).   olmesartan 40 MG tablet Commonly known as: BENICAR TAKE 1 TABLET BY MOUTH EVERY DAY   Toviaz 8 MG Tb24 tablet Generic drug: fesoterodine Take 1 tablet (8 mg total) by mouth daily.       Allergies:  Allergies  Allergen Reactions  . Iodine Anaphylaxis  . Shellfish Allergy Anaphylaxis    Family History: Family History  Problem Relation Age of Onset  . CAD Father   . Polycystic kidney disease Father   . Diabetes Mellitus II Neg Hx   . Hypertension Neg Hx     Social History:  reports that he has never smoked. He has never used smokeless tobacco. He reports that he does not drink alcohol or use drugs.   Physical Exam: BP (!) 156/80   Pulse 69   Ht 5\' 8"  (1.727 m)   Wt 210 lb (95.3 kg)   BMI 31.93 kg/m   Constitutional:  Alert and oriented, No acute distress. HEENT: [name] AT, moist mucus membranes.  Trachea midline, no masses. Cardiovascular: No clubbing, cyanosis, or edema. Respiratory: Normal respiratory  effort, no increased work of breathing. Skin: No rashes, bruises or suspicious lesions. Neurologic: Grossly intact, no focal deficits, moving all 4 extremities. Psychiatric: Normal mood and affect.  Laboratory data:  Urinalysis  UA is negative.   Assessment & Plan:    1. History of nephrolithiasis Asymptomatic Hold off on imaging given absence of symptoms  No stone episodes over past 12 months Follow clinically  2. OAB (overactive bladder) Currently on Toviaz 8 mg daily,  working moderately well however expensive  He will discuss w/ insurance company for drug coverage to pick most cost effective drug Discussed procedural interventions including PTNS vs. Botox along w/ risk and benefits. He is not interested in procedural intervention at this time.  Return in 1 year w/ PSA/IPSS   3. History of prostate cancer PSA very low, recently 0.01 stable from previous years Will check PSA annually   4. SUI (stress urinary incontinence), male Minimal Encouraged pelvic floor exercises and weight loss   F/u 1 year with IPSS/ PVR / UA  [facility] [address], [place], [place] [address] [phone]  I, Lucas Mallow, am acting as a scribe for Dr. Hollice Espy,  I have reviewed the above documentation for accuracy and completeness, and I agree with the above.   Hollice Espy, MD

## 2020-05-01 ENCOUNTER — Other Ambulatory Visit: Payer: Self-pay

## 2020-05-01 ENCOUNTER — Ambulatory Visit: Payer: Medicare HMO | Admitting: Urology

## 2020-05-01 VITALS — BP 156/80 | HR 69 | Ht 68.0 in | Wt 210.0 lb

## 2020-05-01 DIAGNOSIS — N393 Stress incontinence (female) (male): Secondary | ICD-10-CM

## 2020-05-01 DIAGNOSIS — Z8546 Personal history of malignant neoplasm of prostate: Secondary | ICD-10-CM

## 2020-05-01 DIAGNOSIS — N200 Calculus of kidney: Secondary | ICD-10-CM

## 2020-05-01 DIAGNOSIS — N3281 Overactive bladder: Secondary | ICD-10-CM | POA: Diagnosis not present

## 2020-05-02 LAB — MICROSCOPIC EXAMINATION
Bacteria, UA: NONE SEEN
RBC, Urine: NONE SEEN /hpf (ref 0–2)

## 2020-05-02 LAB — URINALYSIS, COMPLETE
Bilirubin, UA: NEGATIVE
Glucose, UA: NEGATIVE
Ketones, UA: NEGATIVE
Leukocytes,UA: NEGATIVE
Nitrite, UA: NEGATIVE
Protein,UA: NEGATIVE
RBC, UA: NEGATIVE
Specific Gravity, UA: 1.02 (ref 1.005–1.030)
Urobilinogen, Ur: 0.2 mg/dL (ref 0.2–1.0)
pH, UA: 5 (ref 5.0–7.5)

## 2020-05-13 DIAGNOSIS — H6063 Unspecified chronic otitis externa, bilateral: Secondary | ICD-10-CM | POA: Diagnosis not present

## 2020-05-13 DIAGNOSIS — H6123 Impacted cerumen, bilateral: Secondary | ICD-10-CM | POA: Diagnosis not present

## 2020-07-22 DIAGNOSIS — R7309 Other abnormal glucose: Secondary | ICD-10-CM | POA: Diagnosis not present

## 2020-07-22 DIAGNOSIS — I10 Essential (primary) hypertension: Secondary | ICD-10-CM | POA: Diagnosis not present

## 2020-07-22 DIAGNOSIS — N200 Calculus of kidney: Secondary | ICD-10-CM | POA: Diagnosis not present

## 2020-07-22 DIAGNOSIS — Z23 Encounter for immunization: Secondary | ICD-10-CM | POA: Diagnosis not present

## 2020-07-22 DIAGNOSIS — C61 Malignant neoplasm of prostate: Secondary | ICD-10-CM | POA: Diagnosis not present

## 2020-07-22 DIAGNOSIS — I2510 Atherosclerotic heart disease of native coronary artery without angina pectoris: Secondary | ICD-10-CM | POA: Diagnosis not present

## 2020-07-22 DIAGNOSIS — Z79899 Other long term (current) drug therapy: Secondary | ICD-10-CM | POA: Diagnosis not present

## 2020-07-22 DIAGNOSIS — Z85820 Personal history of malignant melanoma of skin: Secondary | ICD-10-CM | POA: Diagnosis not present

## 2020-07-29 DIAGNOSIS — I10 Essential (primary) hypertension: Secondary | ICD-10-CM | POA: Diagnosis not present

## 2020-07-29 DIAGNOSIS — Z8546 Personal history of malignant neoplasm of prostate: Secondary | ICD-10-CM | POA: Diagnosis not present

## 2020-07-29 DIAGNOSIS — Z79899 Other long term (current) drug therapy: Secondary | ICD-10-CM | POA: Diagnosis not present

## 2020-07-29 DIAGNOSIS — I2510 Atherosclerotic heart disease of native coronary artery without angina pectoris: Secondary | ICD-10-CM | POA: Diagnosis not present

## 2020-07-29 DIAGNOSIS — Z85820 Personal history of malignant melanoma of skin: Secondary | ICD-10-CM | POA: Diagnosis not present

## 2020-07-29 DIAGNOSIS — Z86018 Personal history of other benign neoplasm: Secondary | ICD-10-CM | POA: Diagnosis not present

## 2020-07-29 DIAGNOSIS — N200 Calculus of kidney: Secondary | ICD-10-CM | POA: Diagnosis not present

## 2020-07-29 DIAGNOSIS — Z23 Encounter for immunization: Secondary | ICD-10-CM | POA: Diagnosis not present

## 2020-08-12 ENCOUNTER — Ambulatory Visit: Payer: Medicare HMO | Admitting: Dermatology

## 2020-08-12 ENCOUNTER — Other Ambulatory Visit: Payer: Self-pay

## 2020-08-12 DIAGNOSIS — L820 Inflamed seborrheic keratosis: Secondary | ICD-10-CM

## 2020-08-12 DIAGNOSIS — Z86018 Personal history of other benign neoplasm: Secondary | ICD-10-CM | POA: Diagnosis not present

## 2020-08-12 DIAGNOSIS — D692 Other nonthrombocytopenic purpura: Secondary | ICD-10-CM

## 2020-08-12 DIAGNOSIS — Z1283 Encounter for screening for malignant neoplasm of skin: Secondary | ICD-10-CM | POA: Diagnosis not present

## 2020-08-12 DIAGNOSIS — L578 Other skin changes due to chronic exposure to nonionizing radiation: Secondary | ICD-10-CM | POA: Diagnosis not present

## 2020-08-12 DIAGNOSIS — L821 Other seborrheic keratosis: Secondary | ICD-10-CM

## 2020-08-12 DIAGNOSIS — Z85828 Personal history of other malignant neoplasm of skin: Secondary | ICD-10-CM | POA: Diagnosis not present

## 2020-08-12 DIAGNOSIS — L570 Actinic keratosis: Secondary | ICD-10-CM | POA: Diagnosis not present

## 2020-08-12 NOTE — Progress Notes (Signed)
Follow-Up Visit   Subjective  Gregory [name] is a 79 y.o. male who presents for the following: Annual Exam (Patient here today for UBSE. He has a history of SCC, BCC, AK's and dysplatic nevi.). The patient presents for Upper Body Skin Exam (UBSE) for skin cancer screening and mole check. Patient has spots at L ear, L cheek and R sideburn he would like checked. He has done PDT in the past for AK's.  The following portions of the chart were reviewed this encounter and updated as appropriate:  Tobacco  Allergies  Meds  Problems  Med Hx  Surg Hx  Fam Hx     Review of Systems:  No other skin or systemic complaints except as noted in HPI or Assessment and Plan.  Objective  Well appearing patient in no apparent distress; mood and affect are within normal limits.  All skin waist up examined.  Objective  Right ant shoulder, left neck (2): Erythematous keratotic or waxy stuck-on papule or plaque.   Objective  Face, scalp, ears (16): Erythematous thin papules/macules with gritty scale.    Assessment & Plan  Inflamed seborrheic keratosis (2) Right ant shoulder, left neck  Destruction of lesion - Right ant shoulder, left neck Complexity: simple   Destruction method: cryotherapy   Informed consent: discussed and consent obtained   Timeout:  patient name, date of birth, surgical site, and procedure verified Lesion destroyed using liquid nitrogen: Yes   Region frozen until ice ball extended beyond lesion: Yes   Outcome: patient tolerated procedure well with no complications   Post-procedure details: wound care instructions given    AK (actinic keratosis) (16) Face, scalp, ears  Consider PDT  Destruction of lesion - Face, scalp, ears Complexity: simple   Destruction method: cryotherapy   Informed consent: discussed and consent obtained   Timeout:  patient name, date of birth, surgical site, and procedure verified Lesion destroyed using liquid nitrogen: Yes   Region  frozen until ice ball extended beyond lesion: Yes   Outcome: patient tolerated procedure well with no complications   Post-procedure details: wound care instructions given     Purpura - Violaceous macules and patches at arms and hands - Benign - Related to age, sun damage and/or use of blood thinners - Observe - Can use OTC arnica containing moisturizer such as Dermend Bruise Formula if desired - Call for worsening or other concerns  Actinic Damage - diffuse scaly erythematous macules with underlying dyspigmentation - Recommend daily broad spectrum sunscreen SPF 30+ to sun-exposed areas, reapply every 2 hours as needed.  - Call for new or changing lesions.  Seborrheic Keratoses - Stuck-on, waxy, tan-brown papules and plaques  - Discussed benign etiology and prognosis. - Observe - Call for any changes  History of Dysplastic Nevi - No evidence of recurrence today - Recommend regular full body skin exams - Recommend daily broad spectrum sunscreen SPF 30+ to sun-exposed areas, reapply every 2 hours as needed.  - Call if any new or changing lesions are noted between office visits  History of Basal Cell Carcinoma of the Skin - No evidence of recurrence today - Recommend regular full body skin exams - Recommend daily broad spectrum sunscreen SPF 30+ to sun-exposed areas, reapply every 2 hours as needed.  - Call if any new or changing lesions are noted between office visits  History of Squamous Cell Carcinoma of the Skin - No evidence of recurrence today - No lymphadenopathy - Recommend regular full body skin exams -  Recommend daily broad spectrum sunscreen SPF 30+ to sun-exposed areas, reapply every 2 hours as needed.  - Call if any new or changing lesions are noted between office visits  Return in about 3 months (around 11/12/2020) for AK follow up.  Graciella Belton, RMA, am acting as scribe for Sarina Ser, MD . Documentation: I have reviewed the above documentation for  accuracy and completeness, and I agree with the above.  Sarina Ser, MD

## 2020-08-12 NOTE — Patient Instructions (Signed)

## 2020-08-20 ENCOUNTER — Encounter: Payer: Self-pay | Admitting: Dermatology

## 2020-08-20 DIAGNOSIS — H40003 Preglaucoma, unspecified, bilateral: Secondary | ICD-10-CM | POA: Diagnosis not present

## 2020-08-29 DIAGNOSIS — H40002 Preglaucoma, unspecified, left eye: Secondary | ICD-10-CM | POA: Diagnosis not present

## 2020-09-09 DIAGNOSIS — H6123 Impacted cerumen, bilateral: Secondary | ICD-10-CM | POA: Diagnosis not present

## 2020-09-14 ENCOUNTER — Other Ambulatory Visit: Payer: Self-pay | Admitting: Cardiovascular Disease

## 2020-09-30 ENCOUNTER — Other Ambulatory Visit: Payer: Self-pay | Admitting: Cardiovascular Disease

## 2020-10-17 ENCOUNTER — Ambulatory Visit: Payer: Medicare HMO | Admitting: Dermatology

## 2020-10-17 ENCOUNTER — Other Ambulatory Visit: Payer: Self-pay

## 2020-10-17 DIAGNOSIS — L578 Other skin changes due to chronic exposure to nonionizing radiation: Secondary | ICD-10-CM | POA: Diagnosis not present

## 2020-10-17 DIAGNOSIS — L821 Other seborrheic keratosis: Secondary | ICD-10-CM | POA: Diagnosis not present

## 2020-10-17 DIAGNOSIS — L570 Actinic keratosis: Secondary | ICD-10-CM

## 2020-10-17 DIAGNOSIS — Z85820 Personal history of malignant melanoma of skin: Secondary | ICD-10-CM

## 2020-10-17 NOTE — Patient Instructions (Addendum)
Seborrheic Keratosis  What causes seborrheic keratoses? Seborrheic keratoses are harmless, common skin growths that first appear during adult life.  As time goes by, more growths appear.  Some people may develop a large number of them.  Seborrheic keratoses appear on both covered and uncovered body parts.  They are not caused by sunlight.  The tendency to develop seborrheic keratoses can be inherited.  They vary in color from skin-colored to gray, brown, or even black.  They can be either smooth or have a rough, warty surface.   Seborrheic keratoses are superficial and look as if they were stuck on the skin.  Under the microscope this type of keratosis looks like layers upon layers of skin.  That is why at times the top layer may seem to fall off, but the rest of the growth remains and re-grows.    Treatment Seborrheic keratoses do not need to be treated, but can easily be removed in the office.  Seborrheic keratoses often cause symptoms when they rub on clothing or jewelry.  Lesions can be in the way of shaving.  If they become inflamed, they can cause itching, soreness, or burning.  Removal of a seborrheic keratosis can be accomplished by freezing, burning, or surgery. If any spot bleeds, scabs, or grows rapidly, please return to have it checked, as these can be an indication of a skin cancer.  Cryotherapy Aftercare  . Wash gently with soap and water everyday.   . Apply Vaseline and Band-Aid daily until healed.  Prior to procedure, discussed risks of blister formation, small wound, skin dyspigmentation, or rare scar following cryotherapy.   

## 2020-10-17 NOTE — Progress Notes (Signed)
Follow-Up Visit   Subjective  Gregory [name] is a 79 y.o. male who presents for the following: Follow-up (Patient had 16 AK's treated with LN2 at last visit to face, ears and scalp. Patient advises there are some other spots at face that may have gotten missed. ).  Patient had UBSE at last visit on 08/12/20. He does have a history of MM, DN, BCC and SCC.  The following portions of the chart were reviewed this encounter and updated as appropriate:  Tobacco   Allergies   Meds   Problems   Med Hx   Surg Hx   Fam Hx       Review of Systems:  No other skin or systemic complaints except as noted in HPI or Assessment and Plan.  Objective  Well appearing patient in no apparent distress; mood and affect are within normal limits.  A focused examination was performed including face, scalp, neck, hands. Relevant physical exam findings are noted in the Assessment and Plan.  Objective  L antihelix x 1, L helix x 2, L postauricular x 1, L med cheek x 1, L lat brow x 1, L forehead x 1, L frontal scalp x 2, L vertex x 3, vertex x 2, R vertex x 3, R frontal scalp x 2, R lat brow x 1, R cheek x 1, nasal dorsum x 1 (22): Erythematous thin papules/macules with gritty scale.    Assessment & Plan  AK (actinic keratosis) (22) L antihelix x 1, L helix x 2, L postauricular x 1, L med cheek x 1, L lat brow x 1, L forehead x 1, L frontal scalp x 2, L vertex x 3, vertex x 2, R vertex x 3, R frontal scalp x 2, R lat brow x 1, R cheek x 1, nasal dorsum x 1  Prior to procedure, discussed risks of blister formation, small wound, skin dyspigmentation, or rare scar following cryotherapy.    Destruction of lesion - L antihelix x 1, L helix x 2, L postauricular x 1, L med cheek x 1, L lat brow x 1, L forehead x 1, L frontal scalp x 2, L vertex x 3, vertex x 2, R vertex x 3, R frontal scalp x 2, R lat brow x 1, R cheek x 1, nasal dorsum x 1 Complexity: simple   Destruction method: cryotherapy   Informed consent:  discussed and consent obtained   Lesion destroyed using liquid nitrogen: Yes   Cryotherapy cycles:  2 Outcome: patient tolerated procedure well with no complications   Post-procedure details: wound care instructions given     Seborrheic Keratoses - Stuck-on, waxy, tan-brown papules and plaques  - Discussed benign etiology and prognosis. - Observe - Call for any changes  Actinic Damage - diffuse scaly erythematous macules with underlying dyspigmentation - Recommend daily broad spectrum sunscreen SPF 30+ to sun-exposed areas, reapply every 2 hours as needed.  - Call for new or changing lesions.  History of Melanoma - No evidence of recurrence today at scalp treated with Moh's - No lymphadenopathy - Recommend regular full body skin exams - Recommend daily broad spectrum sunscreen SPF 30+ to sun-exposed areas, reapply every 2 hours as needed.  - Call if any new or changing lesions are noted between office visits   Return in about 4 months (around 02/17/2021) for TBSE, AK follow up.  Graciella Belton, RMA, am acting as scribe for Forest Gleason, MD .  Documentation: I have reviewed the above documentation for  accuracy and completeness, and I agree with the above.  Forest Gleason, MD

## 2020-10-18 ENCOUNTER — Encounter: Payer: Self-pay | Admitting: Dermatology

## 2020-10-22 DIAGNOSIS — J310 Chronic rhinitis: Secondary | ICD-10-CM | POA: Diagnosis not present

## 2020-10-22 DIAGNOSIS — H02839 Dermatochalasis of unspecified eye, unspecified eyelid: Secondary | ICD-10-CM | POA: Diagnosis not present

## 2020-10-22 DIAGNOSIS — H5340 Unspecified visual field defects: Secondary | ICD-10-CM | POA: Diagnosis not present

## 2020-10-22 NOTE — Progress Notes (Signed)
Cardiology Office Note  Date:  10/23/2020   ID:  Gregory [name], DOB [birth year], MRN [id number]  PCP:  Gregory Harrier, [name]   Chief Complaint  Patient presents with  . other    12 month follow up. Meds reviewed by the pt. verbally. "doing well."     HPI:  79 year old gentleman with  CAD on cardiac catheterization October 2018 Occluded PDA, collaterals, medical management recommended morbid obesity,  prostate cancer status post resection,  essential hypertension  who denies any prior cardiac history,  He presents for follow-up of his coronary artery disease  Prior brain surgery 2005, subsequent hearing issue, Arthritides, numbness  Runs business, trailers, problems with getting in materials [name] is going up  [name] work reviewed  CR 1,4 now 1.2 on less HCTZ Total chol 113, LDL 50 Hemoglobin A1c 6.0  No exercise, gait instability drift off to the left when standing up  No chest pain, no SOB  Limited by back discomfort, hip pain  EKG personally reviewed by myself on todays visit Shows normal sinus rhythm rate 62 bpm consider old inferior MI  Medical history reviewed Cath  10/06/2017 showed Occluded PDA at the ostium, small vessel Moderate disease of OM1 proximal region, Moderate to severe disease of proximal PL branch,  Moderate disease of ostial/proximal diagonal branch #2,  Mild diffuse disease of LAD. Medical management was recommended of his residual disease, no intervention performed Occluded PDA has collaterals, diffusely diseased likely with thrombus.  He was started on aspirin Plavix, statin Already on beta-blocker  Echocardiogram normal LV function   PMH:   has a past medical history of Actinic keratosis, Basal cell carcinoma (08/21/2009), Cancer ([place]), Cancer ([place]), Dysplastic nevus (08/23/2008), Dysplastic nevus (02/21/2010), Hypertension, Kidney stones, Loss of equilibrium, and Squamous cell carcinoma of skin (07/27/2018).  PSH:    Past  Surgical History:  Procedure Laterality Date  . BLADDER SURGERY  2012   Dr. Madelin [name], [name]  . BRAIN SURGERY Left 2005   Gregory Tumor (stem), Gregory [name]  . CHOLECYSTECTOMY    . CYSTOSCOPY WITH RETROGRADE URETHROGRAM  03/16/2016   Procedure: CYSTOSCOPY WITH RETROGRADE URETHROGRAM;  Surgeon: Gregory [name];  Location: Gregory [name];  Service: Urology;;  . Gregory [name] WITH STENT PLACEMENT Left 02/23/2016   Procedure: CYSTOSCOPY WITH STENT PLACEMENT;  Surgeon: Gregory Leep, [name];  Location: Gregory [name];  Service: Urology;  Laterality: Left;  . CYSTOSCOPY WITH STENT PLACEMENT Left 03/16/2016   Procedure: CYSTOSCOPY WITH STENT PLACEMENT/EXCHANGE;  Surgeon: Gregory [name];  Location: Gregory [name];  Service: Urology;  Laterality: Left;  . LEFT HEART CATH AND CORONARY ANGIOGRAPHY N/A 10/06/2017   Procedure: LEFT HEART CATH AND CORONARY ANGIOGRAPHY;  Surgeon: Gregory [name];  Location: Gregory [name];  Service: Cardiovascular;  Laterality: N/A;  . MELANOMA EXCISION    . PROSTATECTOMY  2003   Dr. Madelin [name], [name]  . STONE EXTRACTION WITH BASKET  03/16/2016   Procedure: STONE EXTRACTION WITH BASKET;  Surgeon: Gregory [name];  Location: Gregory [name];  Service: Urology;;    Current Outpatient Medications  Medication Sig Dispense Refill  . amLODipine (NORVASC) 10 MG tablet TAKE 1 TABLET BY MOUTH EVERY DAY 90 tablet 0  . aspirin EC 81 MG EC tablet Take 1 tablet (81 mg total) by mouth daily. 30 tablet 0  . atenolol (TENORMIN) 25 MG tablet TAKE ONE TABLET BY MOUTH EVERY DAY 90 tablet 3  . atorvastatin (LIPITOR) 40 MG tablet Take 1 tablet (40 mg total) by mouth daily.  PLEASE SCHEDULE OFFICE VISIT FOR FURTHER REFILLS. 90 tablet 0  . fesoterodine (TOVIAZ) 8 MG TB24 tablet Take 1 tablet (8 mg total) by mouth daily. 90 tablet 3  . hydrochlorothiazide (HYDRODIURIL) 25 MG tablet Take 1 tablet (25 mg total) by mouth 3 (three) times a week. 36 tablet 3  . nitroGLYCERIN (NITROSTAT) 0.4 MG SL tablet  Place 1 tablet (0.4 mg total) under the tongue every 5 (five) minutes as needed for chest pain (hold for blood pressure less than 90). 30 tablet 0  . olmesartan (BENICAR) 40 MG tablet Take 1 tablet (40 mg total) by mouth daily. PLEASE SCHEDULE OFFICE VISIT FOR FURTHER REFILLS. 90 tablet 0   No current facility-administered medications for this visit.     Allergies:   Iodine and Shellfish allergy   Social History:  The patient  reports that he has never smoked. He has never used smokeless tobacco. He reports that he does not drink alcohol and does not use drugs.   Family History:   family history includes CAD in his father; Polycystic kidney disease in his father.    Review of Systems: Review of Systems  Constitutional: Negative.   HENT: Negative.   Respiratory: Negative.   Cardiovascular: Negative.   Gastrointestinal: Negative.   Musculoskeletal: Positive for joint pain.       Gait instability  Neurological: Negative.  Negative for loss of consciousness.  Psychiatric/Behavioral: Negative.   All other systems reviewed and are negative.   PHYSICAL EXAM: VS:  BP 130/70 (BP Location: Left Arm, Patient Position: Sitting, Cuff Size: Normal)   Pulse 62   Ht 5\' 8"  (1.727 m)   Wt 209 lb (94.8 kg)   SpO2 95%   BMI 31.78 kg/m  , BMI Body mass index is 31.78 kg/m. Constitutional:  oriented to person, place, and time. No distress.  HENT:  Head: Grossly normal Eyes:  no discharge. No scleral icterus.  Neck: No JVD, no carotid bruits  Cardiovascular: Regular rate and rhythm, no murmurs appreciated Pulmonary/Chest: Clear to auscultation bilaterally, no wheezes or rails Abdominal: Soft.  no distension.  no tenderness.  Musculoskeletal: Normal range of motion Neurological:  normal muscle tone. Coordination normal. No atrophy Skin: Skin warm and dry Psychiatric: normal affect, pleasant  Recent Labs: No results found for requested labs within last 8760 hours.    Lipid Panel [name]  Results  Component Value Date   CHOL 167 10/06/2017   HDL 37 (L) 10/06/2017   LDLCALC 104 (H) 10/06/2017   TRIG 128 10/06/2017      Wt Readings from Last 3 Encounters:  10/23/20 209 lb (94.8 kg)  05/01/20 210 lb (95.3 kg)  10/24/19 207 lb (93.9 kg)     ASSESSMENT AND PLAN:  Coronary artery disease of native artery of native heart with stable angina pectoris (HCC) Currently with no symptoms of angina. No further workup at this time. Continue current medication regimen. Risk factors at goal  Essential hypertension  no changes made to his medications, renal function stable  Mixed hyperlipidemia Cholesterol is at goal on the current lipid regimen. No changes to the medications were made.  Diabetes type 2 with complications No regular exercise program, weight stable A1c stable  Non-ST elevation (NSTEMI) myocardial infarction (HCC)  non-STEMI, he has completed Plavix for 1 year occluded PDA Stay on aspirin,  No further testing at this time, denies anginal symptoms    Total encounter time more than 25 minutes  Greater than 50% was spent in counseling and  coordination of care with the patient    Orders Placed This Encounter  Procedures  . EKG 12-Lead     Signed, Esmond Plants, M.D., Ph.D. 10/23/2020  [place], [place]

## 2020-10-23 ENCOUNTER — Ambulatory Visit: Payer: Medicare HMO | Admitting: Cardiovascular Disease

## 2020-10-23 ENCOUNTER — Encounter: Payer: Self-pay | Admitting: Cardiovascular Disease

## 2020-10-23 ENCOUNTER — Other Ambulatory Visit: Payer: Self-pay

## 2020-10-23 VITALS — BP 130/70 | HR 62 | Ht 68.0 in | Wt 209.0 lb

## 2020-10-23 DIAGNOSIS — E782 Mixed hyperlipidemia: Secondary | ICD-10-CM | POA: Diagnosis not present

## 2020-10-23 DIAGNOSIS — I25118 Atherosclerotic heart disease of native coronary artery with other forms of angina pectoris: Secondary | ICD-10-CM | POA: Diagnosis not present

## 2020-10-23 DIAGNOSIS — Z23 Encounter for immunization: Secondary | ICD-10-CM | POA: Diagnosis not present

## 2020-10-23 DIAGNOSIS — I10 Essential (primary) hypertension: Secondary | ICD-10-CM

## 2020-10-23 NOTE — Patient Instructions (Signed)
Medication Instructions:  No changes  If you need a refill on your cardiac medications before your next appointment, please call your pharmacy.    Lab work: No new labs needed   If you have labs (blood work) drawn today and your tests are completely normal, you will receive your results only by: . MyChart Message (if you have MyChart) OR . A paper copy in the mail If you have any lab test that is abnormal or we need to change your treatment, we will call you to review the results.   Testing/Procedures: No new testing needed   Follow-Up: At [facility], you and your health needs are our priority.  As part of our continuing mission to provide you with exceptional heart care, we have created designated Provider Care Teams.  These Care Teams include your primary Cardiologist (physician) and Advanced Practice Providers (APPs -  Physician Assistants and Nurse Practitioners) who all work together to provide you with the care you need, when you need it.  . You will need a follow up appointment in 12 months  . Providers on your designated Care Team:   . Christopher Berge, NP . Ryan Dunn, PA-C . Jacquelyn Visser, PA-C  Any Other Special Instructions Will Be Listed Below (If Applicable).  COVID-19 Vaccine Information can be found at: [email or web address] For questions related to vaccine distribution or appointments, please email [email or web address] or call [phone].     

## 2020-11-01 DIAGNOSIS — I10 Essential (primary) hypertension: Secondary | ICD-10-CM | POA: Diagnosis not present

## 2020-11-01 DIAGNOSIS — R1032 Left lower quadrant pain: Secondary | ICD-10-CM | POA: Diagnosis not present

## 2020-11-01 DIAGNOSIS — R509 Fever, unspecified: Secondary | ICD-10-CM | POA: Diagnosis not present

## 2020-11-12 ENCOUNTER — Ambulatory Visit: Payer: Medicare HMO | Admitting: Dermatology

## 2020-12-10 ENCOUNTER — Ambulatory Visit: Payer: Medicare HMO | Admitting: Dermatology

## 2020-12-12 ENCOUNTER — Other Ambulatory Visit: Payer: Self-pay | Admitting: Cardiovascular Disease

## 2020-12-26 ENCOUNTER — Other Ambulatory Visit: Payer: Self-pay | Admitting: Cardiovascular Disease

## 2021-01-24 DIAGNOSIS — N1831 Chronic kidney disease, stage 3a: Secondary | ICD-10-CM | POA: Diagnosis not present

## 2021-01-24 DIAGNOSIS — I2510 Atherosclerotic heart disease of native coronary artery without angina pectoris: Secondary | ICD-10-CM | POA: Diagnosis not present

## 2021-01-24 DIAGNOSIS — Z85820 Personal history of malignant melanoma of skin: Secondary | ICD-10-CM | POA: Diagnosis not present

## 2021-01-24 DIAGNOSIS — C61 Malignant neoplasm of prostate: Secondary | ICD-10-CM | POA: Diagnosis not present

## 2021-01-24 DIAGNOSIS — R739 Hyperglycemia, unspecified: Secondary | ICD-10-CM | POA: Diagnosis not present

## 2021-01-24 DIAGNOSIS — N200 Calculus of kidney: Secondary | ICD-10-CM | POA: Diagnosis not present

## 2021-01-24 DIAGNOSIS — D333 Benign neoplasm of cranial nerves: Secondary | ICD-10-CM | POA: Diagnosis not present

## 2021-01-24 DIAGNOSIS — I10 Essential (primary) hypertension: Secondary | ICD-10-CM | POA: Diagnosis not present

## 2021-01-31 DIAGNOSIS — Z0000 Encounter for general adult medical examination without abnormal findings: Secondary | ICD-10-CM | POA: Diagnosis not present

## 2021-01-31 DIAGNOSIS — Z8546 Personal history of malignant neoplasm of prostate: Secondary | ICD-10-CM | POA: Diagnosis not present

## 2021-01-31 DIAGNOSIS — I2510 Atherosclerotic heart disease of native coronary artery without angina pectoris: Secondary | ICD-10-CM | POA: Diagnosis not present

## 2021-01-31 DIAGNOSIS — Z86018 Personal history of other benign neoplasm: Secondary | ICD-10-CM | POA: Diagnosis not present

## 2021-01-31 DIAGNOSIS — I10 Essential (primary) hypertension: Secondary | ICD-10-CM | POA: Diagnosis not present

## 2021-01-31 DIAGNOSIS — Z85820 Personal history of malignant melanoma of skin: Secondary | ICD-10-CM | POA: Diagnosis not present

## 2021-01-31 DIAGNOSIS — M25552 Pain in left hip: Secondary | ICD-10-CM | POA: Diagnosis not present

## 2021-02-24 ENCOUNTER — Ambulatory Visit (INDEPENDENT_AMBULATORY_CARE_PROVIDER_SITE_OTHER): Payer: Medicare HMO | Admitting: Physician Assistant

## 2021-02-24 ENCOUNTER — Other Ambulatory Visit: Payer: Self-pay

## 2021-02-24 DIAGNOSIS — R300 Dysuria: Secondary | ICD-10-CM

## 2021-02-24 LAB — BLADDER SCAN AMB NON-IMAGING: Scan Result: 75 mL

## 2021-02-24 MED ORDER — SULFAMETHOXAZOLE-TRIMETHOPRIM 800-160 MG PO TABS: 1.0000 | ORAL_TABLET | Freq: Two times a day (BID) | ORAL | 0 refills | Status: AC

## 2021-02-24 NOTE — Progress Notes (Signed)
02/24/2021 3:15 PM   Demetrie Borge Scheffel 10-27-1941 [id number]  CC: Chief Complaint  Patient presents with   Urinary Tract Infection    HPI: JUNIE ENGRAM is a 80 y.o. male with PMH nephrolithiasis, PUNLMP, prostate cancer s/p open radical prostatectomy in 2013 with undetectable PSA, and OAB wet with mixed incontinence on Toviaz 8 mg daily who presents today for evaluation of possible UTI.   Today he reports a 1 day history of dysuria, urgency, and frequency.  He denies fever, chills, nausea, vomiting, gross hematuria, and flank pain.  He states his current symptoms do not resemble his past kidney stone episodes.  He does not have a history of recurrent UTI.  In-office UA today positive for 3+ blood, 1+ protein, nitrites, and 2+ leukocyte esterase; urine microscopy with >30 WBCs/HPF, 11-30 RBCs/HPF, and moderate bacteria. PVR 79mL.  PMH: Past Medical History:  Diagnosis Date   Actinic keratosis    Basal cell carcinoma 08/21/2009   Left neck. Excised: 10/03/2009, margins free.   Cancer [facility])    Prostate   Cancer ([place])    Melanoma   Dysplastic nevus 08/23/2008   Right vertex. Moderate to severe atypia.    Dysplastic nevus 02/21/2010   Right vertex. Moderate to severe atypia, bordering on early evolving MIS.   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 WD SCC. [facility]    Surgical History: Past Surgical History:  Procedure Laterality Date   BLADDER SURGERY  2012   Dr. Madelin Headings, [place] SURGERY Left 2005   Aaron Edelman Tumor (stem), [place]   CHOLECYSTECTOMY     CYSTOSCOPY WITH RETROGRADE URETHROGRAM  03/16/2016   Procedure: CYSTOSCOPY WITH RETROGRADE URETHROGRAM;  Surgeon: Hollice Espy, MD;  Location: [facility];  Service: Urology;;   CYSTOSCOPY WITH STENT PLACEMENT Left 02/23/2016   Procedure: CYSTOSCOPY WITH  STENT PLACEMENT;  Surgeon: Dereck Leep, MD;  Location: [facility];  Service: Urology;  Laterality: Left;   CYSTOSCOPY WITH STENT PLACEMENT Left 03/16/2016   Procedure: CYSTOSCOPY WITH STENT PLACEMENT/EXCHANGE;  Surgeon: Hollice Espy, MD;  Location: [facility];  Service: Urology;  Laterality: Left;   LEFT HEART CATH AND CORONARY ANGIOGRAPHY N/A 10/06/2017   Procedure: LEFT HEART CATH AND CORONARY ANGIOGRAPHY;  Surgeon: Minna Merritts, MD;  Location: [facility];  Service: Cardiovascular;  Laterality: N/A;   MELANOMA EXCISION     PROSTATECTOMY  2003   Dr. Madelin Headings, [place] EXTRACTION WITH BASKET  03/16/2016   Procedure: STONE EXTRACTION WITH BASKET;  Surgeon: Hollice Espy, MD;  Location: [facility];  Service: Urology;;    Home Medications:  Allergies as of 02/24/2021      Reactions   Iodine Anaphylaxis   Shellfish Allergy Anaphylaxis      Medication List       Accurate as of February 24, 2021  3:15 PM. If you have any questions, ask your nurse or doctor.        amLODipine 10 MG tablet Commonly known as: NORVASC TAKE 1 TABLET BY MOUTH EVERY DAY   aspirin 81 MG EC tablet Take 1 tablet (81 mg total) by mouth daily.   atenolol 25 MG tablet Commonly known as: TENORMIN TAKE ONE TABLET BY MOUTH EVERY DAY   atorvastatin 40 MG tablet Commonly known as: LIPITOR TAKE  1 TABLET (40 MG TOTAL) BY MOUTH DAILY. PLEASE SCHEDULE OFFICE VISIT FOR FURTHER REFILLS.   hydrochlorothiazide 25 MG tablet Commonly known as: HYDRODIURIL Take 1 tablet (25 mg total) by mouth 3 (three) times a week.   nitroGLYCERIN 0.4 MG SL tablet Commonly known as: NITROSTAT Place 1 tablet (0.4 mg total) under the tongue every 5 (five) minutes as needed for chest pain (hold for blood pressure less than 90).   olmesartan 40 MG tablet Commonly known as: BENICAR TAKE 1 TABLET (40 MG TOTAL) BY MOUTH DAILY. PLEASE SCHEDULE OFFICE VISIT FOR FURTHER REFILLS.   sulfamethoxazole-trimethoprim 800-160  MG tablet Commonly known as: BACTRIM DS Take 1 tablet by mouth 2 (two) times daily for 7 days. Started by: Debroah Loop, PA-C   Toviaz 8 MG Tb24 tablet Generic drug: fesoterodine Take 1 tablet (8 mg total) by mouth daily.       Allergies:  Allergies  Allergen Reactions   Iodine Anaphylaxis   Shellfish Allergy Anaphylaxis    Family History: Family History  Problem Relation Age of Onset   CAD Father    Polycystic kidney disease Father    Diabetes Mellitus II Neg Hx    Hypertension Neg Hx     Social History:   reports that he has never smoked. He has never used smokeless tobacco. He reports that he does not drink alcohol and does not use drugs.  Physical Exam: There were no vitals taken for this visit.  Constitutional:  Alert and oriented, no acute distress, nontoxic appearing HEENT: , AT Cardiovascular: No clubbing, cyanosis, or edema Respiratory: Normal respiratory effort, no increased work of breathing Skin: No rashes, bruises or suspicious lesions Neurologic: Grossly intact, no focal deficits, moving all 4 extremities Psychiatric: Normal mood and affect  Laboratory Data: Results for orders placed or performed in visit on 02/24/21  CULTURE, URINE COMPREHENSIVE   Specimen: Urine   UR  Result Value Ref Range   Urine Culture, Comprehensive Preliminary report (A)    Organism ID, Bacteria Escherichia coli (A)    Organism ID, Bacteria Gram negative rods (A)   Microscopic Examination   Urine  Result Value Ref Range   WBC, UA >30 (A) 0 - 5 /hpf   RBC 11-30 (A) 0 - 2 /hpf   Epithelial Cells (non renal) 0-10 0 - 10 /hpf   Renal Epithel, UA 0-10 (A) None seen /hpf   Bacteria, UA Moderate (A) None seen/Few  Urinalysis, Complete  Result Value Ref Range   Specific Gravity, UA 1.020 1.005 - 1.030   pH, UA 5.5 5.0 - 7.5   Color, UA Yellow Yellow   Appearance Ur Cloudy (A) Clear   Leukocytes,UA 2+ (A) Negative   Protein,UA 1+ (A) Negative/Trace    Glucose, UA Negative Negative   Ketones, UA Negative Negative   RBC, UA 3+ (A) Negative   Bilirubin, UA Negative Negative   Urobilinogen, Ur 0.2 0.2 - 1.0 mg/dL   Nitrite, UA Positive (A) Negative   Microscopic Examination See below:   BLADDER SCAN AMB NON-IMAGING  Result Value Ref Range   Scan Result 75 mL   Assessment & Plan:   1. Dysuria UA grossly infected today.  Will start empiric Bactrim and send for culture for further evaluation.  He will require repeat UA in 1 week to prove resolution of MH.  Reviewed return precautions today including fever, chills, nausea, vomiting, and flank pain.  If he develops any of these, recommend KUB for evaluation of possible acute stone  episode, though he is not symptomatic of this at this time. - Urinalysis, Complete - CULTURE, URINE COMPREHENSIVE - sulfamethoxazole-trimethoprim (BACTRIM DS) 800-160 MG tablet; Take 1 tablet by mouth 2 (two) times daily for 7 days.  Dispense: 14 tablet; Refill: 0 - BLADDER SCAN AMB NON-IMAGING  Return in about 1 week (around 03/03/2021) for Repeat UA .  Debroah Loop, PA-C  [facility] [address], [place], [place] [address] [phone]

## 2021-02-25 DIAGNOSIS — H40003 Preglaucoma, unspecified, bilateral: Secondary | ICD-10-CM | POA: Diagnosis not present

## 2021-02-26 ENCOUNTER — Ambulatory Visit: Payer: Medicare HMO | Admitting: Dermatology

## 2021-02-26 ENCOUNTER — Other Ambulatory Visit: Payer: Self-pay

## 2021-02-26 ENCOUNTER — Encounter: Payer: Self-pay | Admitting: Dermatology

## 2021-02-26 DIAGNOSIS — L578 Other skin changes due to chronic exposure to nonionizing radiation: Secondary | ICD-10-CM | POA: Diagnosis not present

## 2021-02-26 DIAGNOSIS — L820 Inflamed seborrheic keratosis: Secondary | ICD-10-CM | POA: Diagnosis not present

## 2021-02-26 DIAGNOSIS — Z86006 Personal history of melanoma in-situ: Secondary | ICD-10-CM

## 2021-02-26 DIAGNOSIS — Z872 Personal history of diseases of the skin and subcutaneous tissue: Secondary | ICD-10-CM | POA: Diagnosis not present

## 2021-02-26 DIAGNOSIS — D1800 Hemangioma unspecified site: Secondary | ICD-10-CM

## 2021-02-26 DIAGNOSIS — Z86018 Personal history of other benign neoplasm: Secondary | ICD-10-CM

## 2021-02-26 DIAGNOSIS — L814 Other melanin hyperpigmentation: Secondary | ICD-10-CM | POA: Diagnosis not present

## 2021-02-26 DIAGNOSIS — Z1283 Encounter for screening for malignant neoplasm of skin: Secondary | ICD-10-CM

## 2021-02-26 DIAGNOSIS — D229 Melanocytic nevi, unspecified: Secondary | ICD-10-CM

## 2021-02-26 DIAGNOSIS — Z85828 Personal history of other malignant neoplasm of skin: Secondary | ICD-10-CM

## 2021-02-26 DIAGNOSIS — L821 Other seborrheic keratosis: Secondary | ICD-10-CM | POA: Diagnosis not present

## 2021-02-26 LAB — URINALYSIS, COMPLETE
Bilirubin, UA: NEGATIVE
Glucose, UA: NEGATIVE
Ketones, UA: NEGATIVE
Nitrite, UA: POSITIVE — AB
Specific Gravity, UA: 1.02 (ref 1.005–1.030)
Urobilinogen, Ur: 0.2 mg/dL (ref 0.2–1.0)
pH, UA: 5.5 (ref 5.0–7.5)

## 2021-02-26 LAB — MICROSCOPIC EXAMINATION: WBC, UA: >30 /hpf — AB (ref 0–5)

## 2021-02-26 NOTE — Patient Instructions (Addendum)
Melanoma ABCDEs  Melanoma is the most dangerous type of skin cancer, and is the leading cause of death from skin disease.  You are more likely to develop melanoma if you:  Have light-colored skin, light-colored eyes, or red or blond hair  Spend a lot of time in the sun  Tan regularly, either outdoors or in a tanning bed  Have had blistering sunburns, especially during childhood  Have a close family member who has had a melanoma  Have atypical moles or large birthmarks  Early detection of melanoma is key since treatment is typically straightforward and cure rates are extremely high if we catch it early.   The first sign of melanoma is often a change in a mole or a new dark spot.  The ABCDE system is a way of remembering the signs of melanoma.  A for asymmetry:  The two halves do not match. B for border:  The edges of the growth are irregular. C for color:  A mixture of colors are present instead of an even brown color. D for diameter:  Melanomas are usually (but not always) greater than 68mm - the size of a pencil eraser. E for evolution:  The spot keeps changing in size, shape, and color.  Please check your skin once per month between visits. You can use a small mirror in front and a large mirror behind you to keep an eye on the back side or your body.   If you see any new or changing lesions before your next follow-up, please call to schedule a visit.  Please continue daily skin protection including broad spectrum sunscreen SPF 30+ to sun-exposed areas, reapplying every 2 hours as needed when you're outdoors.    Gentle Skin Care Guide  1. Bathe no more than once a day.  2. Avoid bathing in hot water  3. Use a mild soap like Dove, Vanicream, Cetaphil, CeraVe. Can use Lever 2000 or Cetaphil antibacterial soap  4. Use soap only where you need it. On most days, use it under your arms, between your legs, and on your feet. Let the water rinse other areas unless visibly dirty.  5.  When you get out of the bath/shower, use a towel to gently blot your skin dry, don't rub it.  6. While your skin is still a little damp, apply a moisturizing cream such as Vanicream, CeraVe, Cetaphil, Eucerin, Sarna lotion or plain Vaseline Jelly. For hands apply Neutrogena [place] Hand Cream or Excipial Hand Cream.  7. Reapply moisturizer any time you start to itch or feel dry.  8. Sometimes using free and clear laundry detergents can be helpful. Fabric softener sheets should be avoided. Downy Free & Gentle liquid, or any liquid fabric softener that is free of dyes and perfumes, it acceptable to use  9. If your doctor has given you prescription creams you may apply moisturizers over them    Instructions for [facility] Medications  One or more of your medications was sent to the [facility] mail order compounding pharmacy. You will receive an email from them and can purchase the medicine through that link. It will then be mailed to your home at the address you confirmed. If for any reason you do not receive an email from them, please check your spam folder. If you still do not find the email, please let us know. [facility] phone number is [phone].  - Start (on a Thursday or Friday) 5-fluorouracil/calcipotriene cream twice a day for 7 days to  affected areas including scalp and top of left ear. Prescription sent to [facility]. Patient advised they will receive an email to purchase the medication online and have it sent to their home. Patient provided with handout reviewing treatment course and side effects and advised to call or message [place] on MyChart with any concerns.

## 2021-02-26 NOTE — Progress Notes (Signed)
Follow-Up Visit   Subjective  Gregory [name] is a 80 y.o. male who presents for the following: TBSE (Patient here for full body skin exam and skin cancer screening. Patient not aware of anything new or changing. Patient has hx of MMis, BCC, SCC, AK's and dysplastic nevi. ).  The following portions of the chart were reviewed this encounter and updated as appropriate:   Tobacco  Allergies  Meds  Problems  Med Hx  Surg Hx  Fam Hx      Review of Systems:  No other skin or systemic complaints except as noted in HPI or Assessment and Plan.  Objective  Well appearing patient in no apparent distress; mood and affect are within normal limits.  A full examination was performed including scalp, head, eyes, ears, nose, lips, neck, chest, axillae, abdomen, back, buttocks, bilateral upper extremities, bilateral lower extremities, hands, feet, fingers, toes, fingernails, and toenails. All findings within normal limits unless otherwise noted below.  Objective  Left Forearm: Erythematous keratotic or waxy stuck-on papule or plaque.    Assessment & Plan  Inflamed seborrheic keratosis Left Forearm  Benign appearing. Call for changes. Patient deferred treatment at this time.    Lentigines - Scattered tan macules - Due to sun exposure - Benign-appering, observe - Recommend daily broad spectrum sunscreen SPF 30+ to sun-exposed areas, reapply every 2 hours as needed. - Call for any changes  Seborrheic Keratoses - Stuck-on, waxy, tan-brown papules and plaques  - Discussed benign etiology and prognosis. - Observe - Call for any changes  Melanocytic Nevi - Tan-brown and/or pink-flesh-colored symmetric macules and papules - Benign appearing on exam today - Observation - Call clinic for new or changing moles - Recommend daily use of broad spectrum spf 30+ sunscreen to sun-exposed areas.   Hemangiomas - Red papules - Discussed benign nature - Observe - Call for any  changes  Actinic Damage - Severe, chronic, secondary to cumulative UV radiation exposure over time - diffuse scaly erythematous macules and papules with underlying dyspigmentation - Discussed Prescription "Field Treatment" for Severe, Chronic Confluent Actinic Changes with Pre-Cancerous Actinic Keratoses Field treatment involves treatment of an entire area of skin that has confluent Actinic Changes (Sun/ Ultraviolet light damage) and PreCancerous Actinic Keratoses by method of PhotoDynamic Therapy (PDT) and/or prescription Topical Chemotherapy agents such as 5-fluorouracil, 5-fluorouracil/calcipotriene, and/or imiquimod.  The purpose is to decrease the number of clinically evident and subclinical PreCancerous lesions to prevent progression to development of skin cancer by chemically destroying early precancer changes that may or may not be visible.  It has been shown to reduce the risk of developing skin cancer in the treated area. As a result of treatment, redness, scaling, crusting, and open sores may occur during treatment course. One or more than one of these methods may be used and may have to be used several times to control, suppress and eliminate the PreCancerous changes. Discussed treatment course, expected reaction, and possible side effects. - Recommend daily broad spectrum sunscreen SPF 30+ to sun-exposed areas, reapply every 2 hours as needed.  - Staying in the shade or wearing long sleeves, sun glasses (UVA+UVB protection) and wide brim hats (4-inch brim around the entire circumference of the hat) are also recommended. - Call for new or changing lesions. - Start 5-fluorouracil/calcipotriene cream twice a day for 7 days to affected areas including scalp and left helix. Prescription sent to [facility]. Patient advised they will receive an email to purchase the medication online and  have it sent to their home. Patient provided with handout reviewing treatment course and  side effects and advised to call or message [place] on MyChart with any concerns.  Skin cancer screening performed today.  History of Melanoma in Situ - No evidence of recurrence today at right vertex - Recommend regular full body skin exams - Recommend daily broad spectrum sunscreen SPF 30+ to sun-exposed areas, reapply every 2 hours as needed.  - Call if any new or changing lesions are noted between office visits  History of Dysplastic Nevi - No evidence of recurrence today at right vertex - Recommend regular full body skin exams - Recommend daily broad spectrum sunscreen SPF 30+ to sun-exposed areas, reapply every 2 hours as needed.  - Call if any new or changing lesions are noted between office visits  History of Squamous Cell Carcinoma of the Skin - No evidence of recurrence today ar right posterior ear - No lymphadenopathy - Recommend regular full body skin exams - Recommend daily broad spectrum sunscreen SPF 30+ to sun-exposed areas, reapply every 2 hours as needed.  - Call if any new or changing lesions are noted between office visits  History of Basal Cell Carcinoma of the Skin - No evidence of recurrence today at left neck - Recommend regular full body skin exams - Recommend daily broad spectrum sunscreen SPF 30+ to sun-exposed areas, reapply every 2 hours as needed.  - Call if any new or changing lesions are noted between office visits  History of PreCancerous Actinic Keratosis  - site(s) of PreCancerous Actinic Keratosis clear today. - these may recur and new lesions may form requiring treatment to prevent transformation into skin cancer - observe for new or changing spots and contact [place] for appointment if occur - photoprotection with sun protective clothing; sunglasses and broad spectrum sunscreen with SPF of at least 30 + and frequent self skin exams recommended - yearly exams by a dermatologist recommended for persons with history of PreCancerous Actinic  Keratoses  Return in about 3 months (around 05/29/2021) for AK follow up.  I, Margarette Asal, RMA, scribed for Alfonso Patten, MD.  Documentation: I have reviewed the above documentation for accuracy and completeness, and I agree with the above.  Forest Gleason, MD

## 2021-02-28 LAB — CULTURE, URINE COMPREHENSIVE

## 2021-03-03 ENCOUNTER — Other Ambulatory Visit: Payer: Self-pay

## 2021-03-03 ENCOUNTER — Other Ambulatory Visit: Payer: Medicare HMO

## 2021-03-03 ENCOUNTER — Encounter: Payer: Self-pay | Admitting: Dermatology

## 2021-03-03 DIAGNOSIS — R300 Dysuria: Secondary | ICD-10-CM | POA: Diagnosis not present

## 2021-03-03 LAB — MICROSCOPIC EXAMINATION: Bacteria, UA: NONE SEEN

## 2021-03-03 LAB — URINALYSIS, COMPLETE
Bilirubin, UA: NEGATIVE
Glucose, UA: NEGATIVE
Ketones, UA: NEGATIVE
Leukocytes,UA: NEGATIVE
Nitrite, UA: NEGATIVE
Protein,UA: NEGATIVE
RBC, UA: NEGATIVE
Specific Gravity, UA: >1.03 — ABNORMAL HIGH (ref 1.005–1.030)
Urobilinogen, Ur: 0.2 mg/dL (ref 0.2–1.0)
pH, UA: 5.5 (ref 5.0–7.5)

## 2021-03-07 ENCOUNTER — Telehealth: Payer: Self-pay

## 2021-03-07 NOTE — Telephone Encounter (Signed)
Ok per [place] Camp, [facility] notifying patient as advised.

## 2021-03-07 NOTE — Telephone Encounter (Signed)
-----   Message from Debroah Loop, [place] sent at 03/06/2021  3:49 PM EDT ----- Please inform the patient that his microscopic hematuria has resolved with completion of culture appropriate antibiotics.  No further intervention indicated. ----- Message ----- From: Lavone Neri Lab Results In Sent: 03/03/2021   4:37 PM EDT To: Debroah Loop, PA-C

## 2021-05-02 ENCOUNTER — Telehealth: Payer: Self-pay | Admitting: Urology

## 2021-05-02 NOTE — Telephone Encounter (Signed)
Pt called office asking about having his PSA drawn at next appt w/Br[facility].  He says he doesn't have a prostate.  He has appt next Wednesday.

## 2021-05-02 NOTE — Telephone Encounter (Signed)
Left VM to return call 

## 2021-05-07 ENCOUNTER — Ambulatory Visit: Payer: Medicare HMO | Admitting: Urology

## 2021-05-07 ENCOUNTER — Other Ambulatory Visit: Payer: Self-pay

## 2021-05-07 ENCOUNTER — Encounter: Payer: Self-pay | Admitting: Urology

## 2021-05-07 VITALS — BP 129/80 | HR 65 | Ht 68.0 in | Wt 209.0 lb

## 2021-05-07 DIAGNOSIS — Z8546 Personal history of malignant neoplasm of prostate: Secondary | ICD-10-CM

## 2021-05-07 DIAGNOSIS — Z87442 Personal history of urinary calculi: Secondary | ICD-10-CM

## 2021-05-07 DIAGNOSIS — R300 Dysuria: Secondary | ICD-10-CM | POA: Diagnosis not present

## 2021-05-07 DIAGNOSIS — N3281 Overactive bladder: Secondary | ICD-10-CM

## 2021-05-07 LAB — BLADDER SCAN AMB NON-IMAGING: Scan Result: 8

## 2021-05-07 NOTE — Progress Notes (Signed)
05/07/2021 10:23 AM   Gregory [name] 11-11-1941 [id number]  Referring provider: Tracie Harrier, MD [address] [facility] [place],  [place] [address]  Chief Complaint  Patient presents with  . Nephrolithiasis    HPI: 80 year old male who returns for annual follow-up of multiple GU issues as outlined below.  Since last visit, he was treated for a UTI x1 seen by our PA, [employer].  History of kidney stonesstone Last KUB 04/2017 negative for stones s/p L URS 02/2016 for4 mm left midureteral stone, 3 mm nonobstructing L stone  [id number] previous remote stone episodes which has not required surgical intervention Stone analysis 97% calcium oxalate, 3% calcium phosphate No further flank pain or stone episodes times multiple years including over the past 12 months  History of benign bladder growth TURBT in 2012, PUNLMP Previously followed by cystoscopy annually, lastat time of URS (2017)  History of prostate cancer S/p open radical prostatectomy 2013 PSA0.01 on 07/2020, stable  SUI/ urinary urgency Rare leakagewith movement, minimal bother from SUI PVR today minimal IPSS below Currently still on Toviaz 8 mg for urge incontinence-reports that this is about $40 per month with his co-pay which is reasonable.  He does believe it helps with his urgency frequency type symptoms.   IPSS    Row Name 05/07/21 1000         International Prostate Symptom Score   How often have you had the sensation of not emptying your bladder? Not at All     How often have you had to urinate less than every two hours? Less than 1 in 5 times     How often have you found you stopped and started again several times when you urinated? Less than half the time     How often have you found it difficult to postpone urination? More than half the time     How often have you had a weak urinary stream? More than half the time     How often have you had to  strain to start urination? Not at All     How many times did you typically get up at night to urinate? 3 Times     Total IPSS Score 14           Quality of Life due to urinary symptoms   If you were to spend the rest of your life with your urinary condition just the way it is now how would you feel about that? Mostly Satisfied            Score:  1-7 Mild 8-19 Moderate 20-35 Severe  PMH: Past Medical History:  Diagnosis Date  . Actinic keratosis   . Basal cell carcinoma 08/21/2009   Left neck. Excised: 10/03/2009, margins free.  . Cancer [facility])    Prostate  . Cancer [facility]) 2009, 2011   Right vertex. Moderate to severe atypia, bordering on early evolving MMis.  . Dysplastic nevus 08/23/2008   Right vertex. Moderate to severe atypia, bordering on early evolving MMis.  . Dysplastic nevus 02/21/2010   Right vertex. Moderate to severe atypia, bordering on early evolving MMis.  . Hypertension   . Kidney stones   . 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 Squamous cell carcinoma of skin 07/27/2018   Right posterior ear. WD SCC. [facility]    Surgical History: Past Surgical History:  Procedure Laterality Date  . BLADDER  SURGERY  2012   Dr. Madelin Headings, [facility]  . BRAIN SURGERY Left 2005   Brian Tumor (stem), [facility]  . CHOLECYSTECTOMY    . CYSTOSCOPY WITH RETROGRADE URETHROGRAM  03/16/2016   Procedure: CYSTOSCOPY WITH RETROGRADE URETHROGRAM;  Surgeon: Hollice Espy, MD;  Location: [facility] ORS;  Service: Urology;;  . Consuela Mimes WITH STENT PLACEMENT Left 02/23/2016   Procedure: CYSTOSCOPY WITH STENT PLACEMENT;  Surgeon: Dereck Leep, MD;  Location: [facility] ORS;  Service: Urology;  Laterality: Left;  . CYSTOSCOPY WITH STENT PLACEMENT Left 03/16/2016   Procedure: CYSTOSCOPY WITH STENT PLACEMENT/EXCHANGE;  Surgeon: Hollice Espy, MD;  Location: [facility] ORS;  Service: Urology;  Laterality: Left;  . LEFT HEART CATH AND CORONARY ANGIOGRAPHY N/A  10/06/2017   Procedure: LEFT HEART CATH AND CORONARY ANGIOGRAPHY;  Surgeon: Minna Merritts, MD;  Location: [place] [facility];  Service: Cardiovascular;  Laterality: N/A;  . MELANOMA EXCISION    . PROSTATECTOMY  2003   [facility]. Madelin Headings, [facility]  . STONE EXTRACTION WITH BASKET  03/16/2016   Procedure: STONE EXTRACTION WITH BASKET;  Surgeon: Hollice Espy, MD;  Location: [facility] ORS;  Service: Urology;;    Home Medications:  Allergies as of 05/07/2021      Reactions   Iodine Anaphylaxis   Shellfish Allergy Anaphylaxis      Medication List       Accurate as of May 07, 2021 10:23 AM. If you have any questions, ask your nurse or doctor.        amLODipine 10 MG tablet Commonly known as: NORVASC TAKE 1 TABLET BY MOUTH EVERY DAY   aspirin 81 MG EC tablet Take 1 tablet (81 mg total) by mouth daily.   atenolol 25 MG tablet Commonly known as: TENORMIN TAKE ONE TABLET BY MOUTH EVERY DAY   atorvastatin 40 MG tablet Commonly known as: LIPITOR TAKE 1 TABLET (40 MG TOTAL) BY MOUTH DAILY. PLEASE SCHEDULE OFFICE VISIT FOR FURTHER REFILLS.   hydrochlorothiazide 25 MG tablet Commonly known as: HYDRODIURIL Take 1 tablet (25 mg total) by mouth 3 (three) times a week.   nitroGLYCERIN 0.4 MG SL tablet Commonly known as: NITROSTAT Place 1 tablet (0.4 mg total) under the tongue every 5 (five) minutes as needed for chest pain (hold for blood pressure less than 90).   olmesartan 40 MG tablet Commonly known as: BENICAR TAKE 1 TABLET (40 MG TOTAL) BY MOUTH DAILY. PLEASE SCHEDULE OFFICE VISIT FOR FURTHER REFILLS.   Toviaz 8 MG Tb24 tablet Generic drug: fesoterodine Take 1 tablet (8 mg total) by mouth daily.       Allergies:  Allergies  Allergen Reactions  . Iodine Anaphylaxis  . Shellfish Allergy Anaphylaxis    Family History: Family History  Problem Relation Age of Onset  . CAD Father   . Polycystic kidney disease Father   . Diabetes Mellitus II Neg Hx   . Hypertension Neg Hx      Social History:  reports that he has never smoked. He has never used smokeless tobacco. He reports that he does not drink alcohol and does not use drugs.   Physical Exam: BP 129/80   Pulse 65   Ht 5\' 8"  (1.727 m)   Wt 209 lb (94.8 kg)   BMI 31.78 kg/m   Constitutional:  Alert and oriented, No acute distress. HEENT: [place] AT, moist mucus membranes.  Trachea midline, no masses. Cardiovascular: No clubbing, cyanosis, or edema. Respiratory: Normal respiratory effort, no increased work of breathing. Skin: No rashes, bruises or suspicious  lesions. Neurologic: Grossly intact, no focal deficits, moving all 4 extremities. Psychiatric: Normal mood and affect.  UA today is negative, see epic  Results for orders placed or performed in visit on 05/07/21  Bladder Scan (Post Void Residual) in office  Result Value Ref Range   Scan Result 8     Assessment & Plan:    1. History of prostate cancer No evidence of disease, PSA remains undetectable  Recommend continued annual PSA testing, will get this done in 07/2021 with his PCP at the time of routine annual labs  2. History of nephrolithiasis Clinically asymptomatic, will hold off on imaging unless he can become symptomatic or develops another infection which may be indication that stone is the nidus  Isolated UTI x1 over the past 12 months - Urinalysis, Complete  3. OAB (overactive bladder) Emptying well today, symptomatically controlled on Toviaz 8 mg  Will continue this medication, will let us know if there is any less expensive options for him based on his formulary - Bladder Scan (Post Void Residual) in office  F/u 1 year with PSA/ IPSS/ PVR  Hollice Espy, MD  [facility] [address], [place], [place] [address] [phone]

## 2021-05-09 LAB — MICROSCOPIC EXAMINATION
Bacteria, UA: NONE SEEN
RBC, Urine: NONE SEEN /hpf (ref 0–2)
WBC, UA: NONE SEEN /hpf (ref 0–5)

## 2021-05-09 LAB — URINALYSIS, COMPLETE
Bilirubin, UA: NEGATIVE
Glucose, UA: NEGATIVE
Ketones, UA: NEGATIVE
Leukocytes,UA: NEGATIVE
Nitrite, UA: NEGATIVE
Protein,UA: NEGATIVE
RBC, UA: NEGATIVE
Specific Gravity, UA: 1.015 (ref 1.005–1.030)
Urobilinogen, Ur: 0.2 mg/dL (ref 0.2–1.0)
pH, UA: 6 (ref 5.0–7.5)

## 2021-05-30 DIAGNOSIS — H6121 Impacted cerumen, right ear: Secondary | ICD-10-CM | POA: Diagnosis not present

## 2021-05-30 DIAGNOSIS — H938X1 Other specified disorders of right ear: Secondary | ICD-10-CM | POA: Diagnosis not present

## 2021-06-05 ENCOUNTER — Ambulatory Visit (INDEPENDENT_AMBULATORY_CARE_PROVIDER_SITE_OTHER): Payer: Medicare HMO | Admitting: Dermatology

## 2021-06-05 ENCOUNTER — Other Ambulatory Visit: Payer: Self-pay

## 2021-06-05 DIAGNOSIS — L821 Other seborrheic keratosis: Secondary | ICD-10-CM | POA: Diagnosis not present

## 2021-06-05 DIAGNOSIS — L570 Actinic keratosis: Secondary | ICD-10-CM | POA: Diagnosis not present

## 2021-06-05 DIAGNOSIS — L578 Other skin changes due to chronic exposure to nonionizing radiation: Secondary | ICD-10-CM | POA: Diagnosis not present

## 2021-06-05 NOTE — Patient Instructions (Signed)
Cryotherapy Aftercare  Wash gently with soap and water everyday.   Apply Vaseline and Band-Aid daily until healed.   Prior to procedure, discussed risks of blister formation, small wound, skin dyspigmentation, or rare scar following cryotherapy. Recommend Vaseline ointment to treated areas while healing.  Recommend daily broad spectrum sunscreen SPF 30+ to sun-exposed areas, reapply every 2 hours as needed. Call for new or changing lesions.  Staying in the shade or wearing long sleeves, sun glasses (UVA+UVB protection) and wide brim hats (4-inch brim around the entire circumference of the hat) are also recommended for sun protection.   If you have any questions or concerns for your doctor, please call our main line at [phone] and press option 4 to reach your doctor's medical assistant. If no one answers, please leave a voicemail as directed and we will return your call as soon as possible. Messages left after 4 pm will be answered the following business day.   You may also send us a message via MyChart. We typically respond to MyChart messages within 1-2 business days.  For prescription refills, please ask your pharmacy to contact our office. Our fax number is [phone].  If you have an urgent issue when the clinic is closed that cannot wait until the next business day, you can page your doctor at the number below.    Please note that while we do our best to be available for urgent issues outside of office hours, we are not available 24/7.   If you have an urgent issue and are unable to reach us, you may choose to seek medical care at your doctor's office, retail clinic, urgent care center, or emergency room.  If you have a medical emergency, please immediately call 911 or go to the emergency department.  Pager Numbers  - Dr. Kowalski: [phone]  - Dr. Moye: [phone]  - Dr. Stewart: [phone]  In the event of inclement weather, please call our main line at  [phone] for an update on the status of any delays or closures.  Dermatology Medication Tips: Please keep the boxes that topical medications come in in order to help keep track of the instructions about where and how to use these. Pharmacies typically print the medication instructions only on the boxes and not directly on the medication tubes.   If your medication is too expensive, please contact our office at [phone] option 4 or send us a message through MyChart.   We are unable to tell what your co-pay for medications will be in advance as this is different depending on your insurance coverage. However, we may be able to find a substitute medication at lower cost or fill out paperwork to get insurance to cover a needed medication.   If a prior authorization is required to get your medication covered by your insurance company, please allow us 1-2 business days to complete this process.  Drug prices often vary depending on where the prescription is filled and some pharmacies may offer cheaper prices.  The website www.goodrx.com contains coupons for medications through different pharmacies. The prices here do not account for what the cost may be with help from insurance (it may be cheaper with your insurance), but the website can give you the price if you did not use any insurance.  - You can print the associated coupon and take it with your prescription to the pharmacy.  - You may also stop by our office during regular business hours and pick up a GoodRx coupon card.  -   If you need your prescription sent electronically to a different pharmacy, notify our office through [facility] MyChart or by phone at [phone] option 4.  

## 2021-06-05 NOTE — Progress Notes (Signed)
   Follow-Up Visit   Subjective  Gregory [name] is a 80 y.o. male who presents for the following: Follow-up (Patient here today for 3 month AK follow up. Patient treated scalp and forehead with 5FU/calcipotriene and had very good results with crusting and redness. ).  The following portions of the chart were reviewed this encounter and updated as appropriate:   Tobacco  Allergies  Meds  Problems  Med Hx  Surg Hx  Fam Hx       Review of Systems:  No other skin or systemic complaints except as noted in HPI or Assessment and Plan.  Objective  Well appearing patient in no apparent distress; mood and affect are within normal limits.  A focused examination was performed including face, scalp. Relevant physical exam findings are noted in the Assessment and Plan.  right helix x 1, right tragus x 1, left eyebrow x 1, right vertex scalp x 1, left occipital scalp x 1, left helix x 1 (6) Erythematous thin papules/macules with gritty scale.    Assessment & Plan  AK (actinic keratosis) (6) right helix x 1, right tragus x 1, left eyebrow x 1, right vertex scalp x 1, left occipital scalp x 1, left helix x 1  Discussed LN2 versus 5-FU/calcipotriene   Pt prefers cryotherapy. prior to procedure, discussed risks of blister formation, small wound, skin dyspigmentation, or rare scar following cryotherapy. Recommend Vaseline ointment to treated areas while healing.  Actinic keratoses are precancerous spots that appear secondary to cumulative UV radiation exposure/sun exposure over time. They are chronic with expected duration over 1 year. A portion of actinic keratoses will progress to squamous cell carcinoma of the skin. It is not possible to reliably predict which spots will progress to skin cancer and so treatment is recommended to prevent development of skin cancer.  Recommend daily broad spectrum sunscreen SPF 30+ to sun-exposed areas, reapply every 2 hours as needed.  Recommend staying in  the shade or wearing long sleeves, sun glasses (UVA+UVB protection) and wide brim hats (4-inch brim around the entire circumference of the hat). Call for new or changing lesions.    Destruction of lesion - right helix x 1, right tragus x 1, left eyebrow x 1, right vertex scalp x 1, left occipital scalp x 1, left helix x 1  Destruction method: cryotherapy   Informed consent: discussed and consent obtained   Lesion destroyed using liquid nitrogen: Yes   Cryotherapy cycles:  2 Outcome: patient tolerated procedure well with no complications   Post-procedure details: wound care instructions given    Seborrheic Keratoses - Stuck-on, waxy, tan-brown papules and/or plaques  - Benign-appearing - Discussed benign etiology and prognosis. - Observe - Call for any changes  Actinic Damage - chronic, secondary to cumulative UV radiation exposure/sun exposure over time. Well controlled s/p 5FU/calcipotriene therapy.  - diffuse scaly erythematous macules with underlying dyspigmentation - Recommend daily broad spectrum sunscreen SPF 30+ to sun-exposed areas, reapply every 2 hours as needed.  - Recommend staying in the shade or wearing long sleeves, sun glasses (UVA+UVB protection) and wide brim hats (4-inch brim around the entire circumference of the hat). - Call for new or changing lesions.  Return in about 3 months (around 09/05/2021) for AK follow up.  Graciella Belton, RMA, am acting as scribe for Forest Gleason, MD .  Documentation: I have reviewed the above documentation for accuracy and completeness, and I agree with the above.  Forest Gleason, MD

## 2021-06-11 DIAGNOSIS — S2242XA Multiple fractures of ribs, left side, initial encounter for closed fracture: Secondary | ICD-10-CM | POA: Diagnosis not present

## 2021-06-11 DIAGNOSIS — Z6831 Body mass index (BMI) 31.0-31.9, adult: Secondary | ICD-10-CM | POA: Diagnosis not present

## 2021-06-11 DIAGNOSIS — C61 Malignant neoplasm of prostate: Secondary | ICD-10-CM | POA: Diagnosis not present

## 2021-06-11 DIAGNOSIS — H9201 Otalgia, right ear: Secondary | ICD-10-CM | POA: Diagnosis not present

## 2021-06-11 DIAGNOSIS — Z85820 Personal history of malignant melanoma of skin: Secondary | ICD-10-CM | POA: Diagnosis not present

## 2021-06-11 DIAGNOSIS — Z86018 Personal history of other benign neoplasm: Secondary | ICD-10-CM | POA: Diagnosis not present

## 2021-06-11 DIAGNOSIS — R0789 Other chest pain: Secondary | ICD-10-CM | POA: Diagnosis not present

## 2021-06-11 DIAGNOSIS — I10 Essential (primary) hypertension: Secondary | ICD-10-CM | POA: Diagnosis not present

## 2021-06-11 DIAGNOSIS — I2510 Atherosclerotic heart disease of native coronary artery without angina pectoris: Secondary | ICD-10-CM | POA: Diagnosis not present

## 2021-06-18 ENCOUNTER — Encounter: Payer: Self-pay | Admitting: Dermatology

## 2021-07-02 ENCOUNTER — Other Ambulatory Visit: Payer: Self-pay | Admitting: Internal Medicine

## 2021-07-02 ENCOUNTER — Telehealth: Payer: Self-pay | Admitting: Radiology

## 2021-07-02 DIAGNOSIS — R0789 Other chest pain: Secondary | ICD-10-CM

## 2021-07-15 ENCOUNTER — Other Ambulatory Visit: Payer: Self-pay

## 2021-07-15 ENCOUNTER — Ambulatory Visit
Admission: RE | Admit: 2021-07-15 | Discharge: 2021-07-15 | Disposition: A | Discharge status: Home / Self Care | Payer: Medicare HMO | Source: Ambulatory Visit | Attending: Internal Medicine | Admitting: Internal Medicine

## 2021-07-15 DIAGNOSIS — R079 Chest pain, unspecified: Secondary | ICD-10-CM | POA: Diagnosis not present

## 2021-07-15 DIAGNOSIS — R0789 Other chest pain: Secondary | ICD-10-CM | POA: Insufficient documentation

## 2021-07-24 DIAGNOSIS — Z6832 Body mass index (BMI) 32.0-32.9, adult: Secondary | ICD-10-CM | POA: Diagnosis not present

## 2021-07-24 DIAGNOSIS — I2510 Atherosclerotic heart disease of native coronary artery without angina pectoris: Secondary | ICD-10-CM | POA: Diagnosis not present

## 2021-07-24 DIAGNOSIS — Z85820 Personal history of malignant melanoma of skin: Secondary | ICD-10-CM | POA: Diagnosis not present

## 2021-07-24 DIAGNOSIS — C61 Malignant neoplasm of prostate: Secondary | ICD-10-CM | POA: Diagnosis not present

## 2021-07-24 DIAGNOSIS — I10 Essential (primary) hypertension: Secondary | ICD-10-CM | POA: Diagnosis not present

## 2021-07-24 DIAGNOSIS — M25552 Pain in left hip: Secondary | ICD-10-CM | POA: Diagnosis not present

## 2021-07-24 DIAGNOSIS — R7309 Other abnormal glucose: Secondary | ICD-10-CM | POA: Diagnosis not present

## 2021-07-24 DIAGNOSIS — G8929 Other chronic pain: Secondary | ICD-10-CM | POA: Diagnosis not present

## 2021-07-24 DIAGNOSIS — D333 Benign neoplasm of cranial nerves: Secondary | ICD-10-CM | POA: Diagnosis not present

## 2021-07-31 DIAGNOSIS — R7309 Other abnormal glucose: Secondary | ICD-10-CM | POA: Diagnosis not present

## 2021-07-31 DIAGNOSIS — Z8546 Personal history of malignant neoplasm of prostate: Secondary | ICD-10-CM | POA: Diagnosis not present

## 2021-07-31 DIAGNOSIS — I10 Essential (primary) hypertension: Secondary | ICD-10-CM | POA: Diagnosis not present

## 2021-07-31 DIAGNOSIS — E876 Hypokalemia: Secondary | ICD-10-CM | POA: Diagnosis not present

## 2021-07-31 DIAGNOSIS — N62 Hypertrophy of breast: Secondary | ICD-10-CM | POA: Diagnosis not present

## 2021-07-31 DIAGNOSIS — Z86018 Personal history of other benign neoplasm: Secondary | ICD-10-CM | POA: Diagnosis not present

## 2021-07-31 DIAGNOSIS — I2510 Atherosclerotic heart disease of native coronary artery without angina pectoris: Secondary | ICD-10-CM | POA: Diagnosis not present

## 2021-08-01 ENCOUNTER — Other Ambulatory Visit: Payer: Self-pay | Admitting: Internal Medicine

## 2021-08-01 DIAGNOSIS — N62 Hypertrophy of breast: Secondary | ICD-10-CM

## 2021-08-01 DIAGNOSIS — N63 Unspecified lump in unspecified breast: Secondary | ICD-10-CM

## 2021-08-01 DIAGNOSIS — C61 Malignant neoplasm of prostate: Secondary | ICD-10-CM

## 2021-08-05 ENCOUNTER — Ambulatory Visit
Admission: RE | Admit: 2021-08-05 | Discharge: 2021-08-05 | Disposition: A | Discharge status: Home / Self Care | Payer: Medicare HMO | Source: Ambulatory Visit | Attending: Internal Medicine | Admitting: Internal Medicine

## 2021-08-05 ENCOUNTER — Other Ambulatory Visit: Payer: Self-pay

## 2021-08-05 DIAGNOSIS — C61 Malignant neoplasm of prostate: Secondary | ICD-10-CM

## 2021-08-05 DIAGNOSIS — N62 Hypertrophy of breast: Secondary | ICD-10-CM

## 2021-08-05 DIAGNOSIS — N63 Unspecified lump in unspecified breast: Secondary | ICD-10-CM

## 2021-08-21 DIAGNOSIS — H6063 Unspecified chronic otitis externa, bilateral: Secondary | ICD-10-CM | POA: Diagnosis not present

## 2021-08-21 DIAGNOSIS — H90A22 Sensorineural hearing loss, unilateral, left ear, with restricted hearing on the contralateral side: Secondary | ICD-10-CM | POA: Diagnosis not present

## 2021-08-21 DIAGNOSIS — H6123 Impacted cerumen, bilateral: Secondary | ICD-10-CM | POA: Diagnosis not present

## 2021-09-03 DIAGNOSIS — H40003 Preglaucoma, unspecified, bilateral: Secondary | ICD-10-CM | POA: Diagnosis not present

## 2021-09-09 DIAGNOSIS — Z0100 Encounter for examination of eyes and vision without abnormal findings: Secondary | ICD-10-CM | POA: Diagnosis not present

## 2021-09-09 DIAGNOSIS — H40002 Preglaucoma, unspecified, left eye: Secondary | ICD-10-CM | POA: Diagnosis not present

## 2021-09-11 DIAGNOSIS — Z23 Encounter for immunization: Secondary | ICD-10-CM | POA: Diagnosis not present

## 2021-09-17 ENCOUNTER — Other Ambulatory Visit: Payer: Self-pay

## 2021-09-17 ENCOUNTER — Ambulatory Visit: Payer: Medicare HMO | Admitting: Dermatology

## 2021-09-17 DIAGNOSIS — L570 Actinic keratosis: Secondary | ICD-10-CM | POA: Diagnosis not present

## 2021-09-17 NOTE — Progress Notes (Signed)
   Follow-Up Visit   Subjective  Gregory [name] is a 80 y.o. male who presents for the following: Follow-up (Patient here today for 3 month ak follow up. Patient has areas on face and scalp that was treated with ln2 at last appointment. He denies any new concerns. Today ).   The following portions of the chart were reviewed this encounter and updated as appropriate:  Tobacco  Allergies  Meds  Problems  Med Hx  Surg Hx  Fam Hx      Review of Systems: No other skin or systemic complaints except as noted in HPI or Assessment and Plan.   Objective  Well appearing patient in no apparent distress; mood and affect are within normal limits.  A focused examination was performed including face, neck, scalp, ears. Relevant physical exam findings are noted in the Assessment and Plan.  left helix x 1, left scalp x 3 (4) Erythematous thin papules/macules with gritty scale.   Assessment & Plan  Actinic keratosis (4) left helix x 1, left scalp x 3  Actinic keratoses are precancerous spots that appear secondary to cumulative UV radiation exposure/sun exposure over time. They are chronic with expected duration over 1 year. A portion of actinic keratoses will progress to squamous cell carcinoma of the skin. It is not possible to reliably predict which spots will progress to skin cancer and so treatment is recommended to prevent development of skin cancer.  Recommend daily broad spectrum sunscreen SPF 30+ to sun-exposed areas, reapply every 2 hours as needed.  Recommend staying in the shade or wearing long sleeves, sun glasses (UVA+UVB protection) and wide brim hats (4-inch brim around the entire circumference of the hat). Call for new or changing lesions.  Prior to procedure, discussed risks of blister formation, small wound, skin dyspigmentation, or rare scar following cryotherapy. Recommend Vaseline ointment to treated areas while healing.   Destruction of lesion - left helix x 1, left  scalp x 3  Destruction method: cryotherapy   Informed consent: discussed and consent obtained   Lesion destroyed using liquid nitrogen: Yes   Region frozen until ice ball extended beyond lesion: Yes   Outcome: patient tolerated procedure well with no complications   Post-procedure details: wound care instructions given   Additional details:  Prior to procedure, discussed risks of blister formation, small wound, skin dyspigmentation, or rare scar following cryotherapy. Recommend Vaseline ointment to treated areas while healing.   Return for tbse and ak follow up in february . I, Ruthell Rummage, CMA, am acting as scribe for Forest Gleason, MD.  Documentation: I have reviewed the above documentation for accuracy and completeness, and I agree with the above.  Forest Gleason, MD

## 2021-09-17 NOTE — Patient Instructions (Addendum)
Actinic keratoses are precancerous spots that appear secondary to cumulative UV radiation exposure/sun exposure over time. They are chronic with expected duration over 1 year. A portion of actinic keratoses will progress to squamous cell carcinoma of the skin. It is not possible to reliably predict which spots will progress to skin cancer and so treatment is recommended to prevent development of skin cancer.  Recommend daily broad spectrum sunscreen SPF 30+ to sun-exposed areas, reapply every 2 hours as needed.  Recommend staying in the shade or wearing long sleeves, sun glasses (UVA+UVB protection) and wide brim hats (4-inch brim around the entire circumference of the hat). Call for new or changing lesions.   Cryotherapy Aftercare  Wash gently with soap and water everyday.   Apply Vaseline and Band-Aid daily until healed.   If you have any questions or concerns for your doctor, please call our main line at [phone] and press option 4 to reach your doctor's medical assistant. If no one answers, please leave a voicemail as directed and we will return your call as soon as possible. Messages left after 4 pm will be answered the following business day.   You may also send us a message via MyChart. We typically respond to MyChart messages within 1-2 business days.  For prescription refills, please ask your pharmacy to contact our office. Our fax number is [phone].  If you have an urgent issue when the clinic is closed that cannot wait until the next business day, you can page your doctor at the number below.    Please note that while we do our best to be available for urgent issues outside of office hours, we are not available 24/7.   If you have an urgent issue and are unable to reach us, you may choose to seek medical care at your doctor's office, retail clinic, urgent care center, or emergency room.  If you have a medical emergency, please immediately call 911 or go to the emergency  department.  Pager Numbers  - Dr. Kowalski: [phone]  - Dr. Moye: [phone]  - Dr. Stewart: [phone]  In the event of inclement weather, please call our main line at [phone] for an update on the status of any delays or closures.  Dermatology Medication Tips: Please keep the boxes that topical medications come in in order to help keep track of the instructions about where and how to use these. Pharmacies typically print the medication instructions only on the boxes and not directly on the medication tubes.   If your medication is too expensive, please contact our office at [phone] option 4 or send us a message through MyChart.   We are unable to tell what your co-pay for medications will be in advance as this is different depending on your insurance coverage. However, we may be able to find a substitute medication at lower cost or fill out paperwork to get insurance to cover a needed medication.   If a prior authorization is required to get your medication covered by your insurance company, please allow us 1-2 business days to complete this process.  Drug prices often vary depending on where the prescription is filled and some pharmacies may offer cheaper prices.  The website www.goodrx.com contains coupons for medications through different pharmacies. The prices here do not account for what the cost may be with help from insurance (it may be cheaper with your insurance), but the website can give you the price if you did not use any insurance.  - You   can print the associated coupon and take it with your prescription to the pharmacy.  - You may also stop by our office during regular business hours and pick up a GoodRx coupon card.  - If you need your prescription sent electronically to a different pharmacy, notify our office through [facility] MyChart or by phone at [phone] option 4.  

## 2021-09-18 ENCOUNTER — Encounter: Payer: Self-pay | Admitting: Dermatology

## 2021-10-11 DIAGNOSIS — U071 COVID-19: Secondary | ICD-10-CM | POA: Diagnosis not present

## 2021-10-11 DIAGNOSIS — Z03818 Encounter for observation for suspected exposure to other biological agents ruled out: Secondary | ICD-10-CM | POA: Diagnosis not present

## 2021-10-29 ENCOUNTER — Other Ambulatory Visit: Payer: Self-pay

## 2021-10-29 ENCOUNTER — Ambulatory Visit: Payer: Medicare HMO | Admitting: Cardiovascular Disease

## 2021-10-29 ENCOUNTER — Encounter: Payer: Self-pay | Admitting: Cardiovascular Disease

## 2021-10-29 VITALS — BP 140/70 | HR 74 | Ht 68.0 in | Wt 206.0 lb

## 2021-10-29 DIAGNOSIS — E782 Mixed hyperlipidemia: Secondary | ICD-10-CM | POA: Diagnosis not present

## 2021-10-29 DIAGNOSIS — E118 Type 2 diabetes mellitus with unspecified complications: Secondary | ICD-10-CM | POA: Diagnosis not present

## 2021-10-29 DIAGNOSIS — I25118 Atherosclerotic heart disease of native coronary artery with other forms of angina pectoris: Secondary | ICD-10-CM

## 2021-10-29 DIAGNOSIS — I10 Essential (primary) hypertension: Secondary | ICD-10-CM | POA: Diagnosis not present

## 2021-10-29 MED ORDER — ATENOLOL 25 MG PO TABS: 25.0000 mg | ORAL_TABLET | Freq: Every day | ORAL | 3 refills | Status: DC

## 2021-10-29 MED ORDER — ATORVASTATIN CALCIUM 40 MG PO TABS: 40.0000 mg | ORAL_TABLET | Freq: Every day | ORAL | 3 refills | Status: DC

## 2021-10-29 MED ORDER — HYDROCHLOROTHIAZIDE 25 MG PO TABS: 25.0000 mg | ORAL_TABLET | ORAL | 3 refills | Status: DC

## 2021-10-29 MED ORDER — OLMESARTAN MEDOXOMIL 40 MG PO TABS: 40.0000 mg | ORAL_TABLET | Freq: Every day | ORAL | 3 refills | Status: DC

## 2021-10-29 MED ORDER — AMLODIPINE BESYLATE 10 MG PO TABS: 10.0000 mg | ORAL_TABLET | Freq: Every day | ORAL | 3 refills | Status: DC

## 2021-10-29 NOTE — Progress Notes (Signed)
Cardiology Office Note  Date:  10/29/2021   ID:  Gregory [name], DOB [birth year], MRN [id number]  PCP:  Tracie Harrier, MD   Chief Complaint  Patient presents with   12 month follow up     "Doing well." Medications reviewed by the patient verbally.     HPI:  80 year old gentleman with  CAD on cardiac catheterization October 2018 Occluded PDA, collaterals, medical management recommended morbid obesity,  prostate cancer status post resection,  essential hypertension  who denies any prior cardiac history,  He presents for follow-up of his coronary artery disease  Still working, sells trailers  Some dizziness, with standing from sitting  One episode of hot flashed going to bed, dizzy, diaphoresis, sits on side of bed, nausea BP 856 systolic  Hip pain on left Denies chest pain concerning for angina, no significant shortness of breath on exertion  Labs  A1C 6 Total chol 120  EKG personally reviewed by myself on todays visit Nsr rate 74 bpm consider old inferior MI  Prior brain surgery 2005, subsequent hearing issue, Arthritides, numbness  Medical history reviewed Cath  10/06/2017 showed Occluded PDA at the ostium, small vessel Moderate disease of OM1 proximal region, Moderate to severe disease of proximal PL branch,  Moderate disease of ostial/proximal diagonal branch #2,  Mild diffuse disease of LAD. Medical management was recommended of his residual disease, no intervention performed Occluded PDA has collaterals, diffusely diseased likely with thrombus.  He was started on aspirin Plavix, statin Already on beta-blocker  Echocardiogram normal LV function    PMH:   has a past medical history of Actinic keratosis, Basal cell carcinoma (08/21/2009), Cancer ([place]), Cancer ([place]) (2009, 2011), Dysplastic nevus (08/23/2008), Dysplastic nevus (02/21/2010), Hypertension, Kidney stones, Loss of equilibrium, and Squamous cell carcinoma of skin (07/27/2018).  PSH:     Past Surgical History:  Procedure Laterality Date   BLADDER SURGERY  2012   Dr. Madelin Headings, [place] SURGERY Left 2005   Aaron Edelman Tumor (stem), [place] WITH RETROGRADE URETHROGRAM  03/16/2016   Procedure: CYSTOSCOPY WITH RETROGRADE URETHROGRAM;  Surgeon: Hollice Espy, MD;  Location: [facility];  Service: Urology;;   CYSTOSCOPY WITH STENT PLACEMENT Left 02/23/2016   Procedure: CYSTOSCOPY WITH STENT PLACEMENT;  Surgeon: Dereck Leep, MD;  Location: [facility];  Service: Urology;  Laterality: Left;   CYSTOSCOPY WITH STENT PLACEMENT Left 03/16/2016   Procedure: CYSTOSCOPY WITH STENT PLACEMENT/EXCHANGE;  Surgeon: Hollice Espy, MD;  Location: [facility];  Service: Urology;  Laterality: Left;   LEFT HEART CATH AND CORONARY ANGIOGRAPHY N/A 10/06/2017   Procedure: LEFT HEART CATH AND CORONARY ANGIOGRAPHY;  Surgeon: Minna Merritts, MD;  Location: [facility];  Service: Cardiovascular;  Laterality: N/A;   MELANOMA EXCISION     PROSTATECTOMY  2003   Dr. Madelin Headings, [place] EXTRACTION WITH BASKET  03/16/2016   Procedure: STONE EXTRACTION WITH BASKET;  Surgeon: Hollice Espy, MD;  Location: [facility];  Service: Urology;;    Current Outpatient Medications  Medication Sig Dispense Refill   amLODipine (NORVASC) 10 MG tablet TAKE 1 TABLET BY MOUTH EVERY DAY 90 tablet 2   aspirin EC 81 MG EC tablet Take 1 tablet (81 mg total) by mouth daily. 30 tablet 0   atenolol (TENORMIN) 25 MG tablet TAKE ONE TABLET BY MOUTH EVERY DAY 90 tablet 3   atorvastatin (LIPITOR) 40 MG tablet TAKE 1 TABLET (40 MG TOTAL) BY MOUTH DAILY. PLEASE  SCHEDULE OFFICE VISIT FOR FURTHER REFILLS. 90 tablet 3   fesoterodine (TOVIAZ) 8 MG TB24 tablet Take 1 tablet (8 mg total) by mouth daily. 90 tablet 3   hydrochlorothiazide (HYDRODIURIL) 25 MG tablet Take 1 tablet (25 mg total) by mouth 3 (three) times a week. 36 tablet 3   nitroGLYCERIN (NITROSTAT) 0.4 MG SL tablet Place 1  tablet (0.4 mg total) under the tongue every 5 (five) minutes as needed for chest pain (hold for blood pressure less than 90). 30 tablet 0   olmesartan (BENICAR) 40 MG tablet TAKE 1 TABLET (40 MG TOTAL) BY MOUTH DAILY. PLEASE SCHEDULE OFFICE VISIT FOR FURTHER REFILLS. 90 tablet 3   No current facility-administered medications for this visit.     Allergies:   Iodine and Shellfish allergy   Social History:  The patient  reports that he has never smoked. He has never used smokeless tobacco. He reports that he does not drink alcohol and does not use drugs.   Family History:   family history includes CAD in his father; Polycystic kidney disease in his father.    Review of Systems: Review of Systems  Constitutional: Negative.   HENT: Negative.    Respiratory: Negative.    Cardiovascular: Negative.   Gastrointestinal: Negative.   Musculoskeletal:  Positive for joint pain.       Gait instability  Neurological: Negative.  Negative for loss of consciousness.  Psychiatric/Behavioral: Negative.    All other systems reviewed and are negative.  PHYSICAL EXAM: VS:  BP 140/70 (BP Location: Left Arm, Patient Position: Sitting, Cuff Size: Normal)   Pulse 74   Ht 5\' 8"  (1.727 m)   Wt 206 lb (93.4 kg)   SpO2 97%   BMI 31.32 kg/m  , BMI Body mass index is 31.32 kg/m. Constitutional:  oriented to person, place, and time. No distress.  HENT:  Head: Grossly normal Eyes:  no discharge. No scleral icterus.  Neck: No JVD, no carotid bruits  Cardiovascular: Regular rate and rhythm, no murmurs appreciated Pulmonary/Chest: Clear to auscultation bilaterally, no wheezes or rails Abdominal: Soft.  no distension.  no tenderness.  Musculoskeletal: Normal range of motion Neurological:  normal muscle tone. Coordination normal. No atrophy Skin: Skin warm and dry Psychiatric: normal affect, pleasant   Recent Labs: No results found for requested labs within last 8760 hours.    Lipid Panel Lab Results   Component Value Date   CHOL 167 10/06/2017   HDL 37 (L) 10/06/2017   LDLCALC 104 (H) 10/06/2017   TRIG 128 10/06/2017      Wt Readings from Last 3 Encounters:  10/29/21 206 lb (93.4 kg)  05/07/21 209 lb (94.8 kg)  10/23/20 209 lb (94.8 kg)     ASSESSMENT AND PLAN:  Coronary artery disease of native artery of native heart with stable angina pectoris (HCC) Currently with no symptoms of angina. No further workup at this time. Continue current medication regimen. Risk factors at goal  Essential hypertension Blood pressure is well controlled on today's visit. No changes made to the medications.  Mixed hyperlipidemia Cholesterol is at goal on the current lipid regimen. No changes to the medications were made.  Diabetes type 2 with complications No regular exercise program, weight stable, still working A1c stable, 6  Non-ST elevation (NSTEMI) myocardial infarction ([place])  non-STEMI, he has completed Plavix for 1 year occluded PDA Stay on aspirin,  Chollesterol at goal    Total encounter time more than 25 minutes  Greater than 50% was  spent in counseling and coordination of care with the patient    No orders of the defined types were placed in this encounter.    Signed, Esmond Plants, M.D., Ph.D. 10/29/2021  [place], [place]

## 2021-10-29 NOTE — Patient Instructions (Addendum)
Medication Instructions:  No changes  If you need a refill on your cardiac medications before your next appointment, please call your pharmacy.   Lab work: No new labs needed  Testing/Procedures: No new testing needed  Follow-Up: At [facility], you and your health needs are our priority.  As part of our continuing mission to provide you with exceptional heart care, we have created designated Provider Care Teams.  These Care Teams include your primary Cardiologist (physician) and Advanced Practice Providers (APPs -  Physician Assistants and Nurse Practitioners) who all work together to provide you with the care you need, when you need it.  You will need a follow up appointment in 12 months  Providers on your designated Care Team:   Christopher Berge, NP Ryan Dunn, PA-C Cadence Furth, PA-C  COVID-19 Vaccine Information can be found at: [email or web address] For questions related to vaccine distribution or appointments, please email [email or web address] or call [phone].   

## 2021-11-19 ENCOUNTER — Encounter: Payer: Self-pay | Admitting: Urology

## 2021-11-19 MED ORDER — OXYBUTYNIN CHLORIDE ER 15 MG PO TB24: 15.0000 mg | ORAL_TABLET | Freq: Every day | ORAL | 11 refills | Status: DC

## 2021-11-24 DIAGNOSIS — R7309 Other abnormal glucose: Secondary | ICD-10-CM | POA: Diagnosis not present

## 2021-11-24 DIAGNOSIS — N62 Hypertrophy of breast: Secondary | ICD-10-CM | POA: Diagnosis not present

## 2021-11-24 DIAGNOSIS — D333 Benign neoplasm of cranial nerves: Secondary | ICD-10-CM | POA: Diagnosis not present

## 2021-11-24 DIAGNOSIS — I2510 Atherosclerotic heart disease of native coronary artery without angina pectoris: Secondary | ICD-10-CM | POA: Diagnosis not present

## 2021-11-24 DIAGNOSIS — I10 Essential (primary) hypertension: Secondary | ICD-10-CM | POA: Diagnosis not present

## 2021-11-24 DIAGNOSIS — C61 Malignant neoplasm of prostate: Secondary | ICD-10-CM | POA: Diagnosis not present

## 2021-11-24 DIAGNOSIS — N63 Unspecified lump in unspecified breast: Secondary | ICD-10-CM | POA: Diagnosis not present

## 2021-11-24 DIAGNOSIS — E876 Hypokalemia: Secondary | ICD-10-CM | POA: Diagnosis not present

## 2021-12-01 DIAGNOSIS — Z86018 Personal history of other benign neoplasm: Secondary | ICD-10-CM | POA: Diagnosis not present

## 2021-12-01 DIAGNOSIS — C61 Malignant neoplasm of prostate: Secondary | ICD-10-CM | POA: Diagnosis not present

## 2021-12-01 DIAGNOSIS — I10 Essential (primary) hypertension: Secondary | ICD-10-CM | POA: Diagnosis not present

## 2021-12-01 DIAGNOSIS — N200 Calculus of kidney: Secondary | ICD-10-CM | POA: Diagnosis not present

## 2021-12-01 DIAGNOSIS — N62 Hypertrophy of breast: Secondary | ICD-10-CM | POA: Diagnosis not present

## 2021-12-01 DIAGNOSIS — I2510 Atherosclerotic heart disease of native coronary artery without angina pectoris: Secondary | ICD-10-CM | POA: Diagnosis not present

## 2021-12-01 DIAGNOSIS — Z85820 Personal history of malignant melanoma of skin: Secondary | ICD-10-CM | POA: Diagnosis not present

## 2021-12-20 DIAGNOSIS — S4992XA Unspecified injury of left shoulder and upper arm, initial encounter: Secondary | ICD-10-CM | POA: Diagnosis not present

## 2021-12-20 DIAGNOSIS — W010XXA Fall on same level from slipping, tripping and stumbling without subsequent striking against object, initial encounter: Secondary | ICD-10-CM | POA: Diagnosis not present

## 2021-12-20 DIAGNOSIS — M19012 Primary osteoarthritis, left shoulder: Secondary | ICD-10-CM | POA: Diagnosis not present

## 2021-12-30 ENCOUNTER — Other Ambulatory Visit: Payer: Self-pay

## 2021-12-30 ENCOUNTER — Emergency Department
Admission: EM | Admit: 2021-12-30 | Discharge: 2021-12-30 | Disposition: A | Discharge status: Home / Self Care | Payer: Medicare HMO | Attending: Emergency Medicine | Admitting: Emergency Medicine

## 2021-12-30 ENCOUNTER — Encounter: Payer: Self-pay | Admitting: Urology

## 2021-12-30 DIAGNOSIS — M25512 Pain in left shoulder: Secondary | ICD-10-CM | POA: Insufficient documentation

## 2021-12-30 MED ORDER — HYDROCODONE-ACETAMINOPHEN 5-325 MG PO TABS: 1.0000 | ORAL_TABLET | Freq: Four times a day (QID) | ORAL | 0 refills | Status: AC | PRN

## 2021-12-30 MED ORDER — ONDANSETRON 4 MG PO TBDP
4.0000 mg | ORAL_TABLET | Freq: Three times a day (TID) | ORAL | 0 refills | Status: AC | PRN
Start: 2021-12-30 — End: 2022-01-04

## 2021-12-30 MED ORDER — HYDROCODONE-ACETAMINOPHEN 5-325 MG PO TABS
1.0000 | ORAL_TABLET | Freq: Once | ORAL | Status: AC
  Administered 2021-12-30: 1 via ORAL
  Filled 2021-12-30: qty 1

## 2021-12-30 MED ORDER — ONDANSETRON 4 MG PO TBDP
4.0000 mg | ORAL_TABLET | Freq: Once | ORAL | Status: AC
  Administered 2021-12-30: 4 mg via ORAL
  Filled 2021-12-30: qty 1

## 2021-12-30 MED ORDER — FESOTERODINE FUMARATE ER 8 MG PO TB24: 8.0000 mg | ORAL_TABLET | Freq: Every day | ORAL | 3 refills | Status: DC

## 2021-12-30 NOTE — ED Provider Notes (Signed)
[facility] Provider Note  Patient Contact: 6:45 PM (approximate)   History   Shoulder Injury   HPI  Gregory [name] is a 81 y.o. male presents to the emergency department with worsening left shoulder pain after a mechanical fall on 1230.  Patient has already been seen and evaluated and had x-rays taken.  He has an appointment with orthopedics on Thursday but states that he needs pain control until he can be evaluated.  He denies chest pain, chest tightness or abdominal pain.      Physical Exam   Triage Vital Signs: ED Triage Vitals  Enc Vitals Group     BP 12/30/21 1549 138/83     Pulse Rate 12/30/21 1549 76     Resp 12/30/21 1549 20     Temp 12/30/21 1549 98.2 F (36.8 C)     Temp Source 12/30/21 1549 Oral     SpO2 12/30/21 1549 98 %     Weight 12/30/21 1550 155 lb (70.3 kg)     Height 12/30/21 1550 5\' 8"  (1.727 m)     Head Circumference --      Peak Flow --      Pain Score 12/30/21 1549 8     Pain Loc --      Pain Edu? --      Excl. in [place]? --     Most recent vital signs: Vitals:   12/30/21 1736 12/30/21 1804  BP: 130/78 132/79  Pulse: 63 (!) 58  Resp: 20 20  Temp:    SpO2: 98% 99%     General: Alert and in no acute distress. Eyes:  PERRL. EOMI. Head: No acute traumatic findings ENT:      Ears:       Nose: No congestion/rhinnorhea.      Mouth/Throat: Mucous membranes are moist.  Neck: No stridor. No cervical spine tenderness to palpation. Hematological/Lymphatic/Immunilogical: No cervical lymphadenopathy. Cardiovascular:  Good peripheral perfusion Respiratory: Normal respiratory effort without tachypnea or retractions. Lungs CTAB. Good air entry to the bases with no decreased or absent breath sounds. Gastrointestinal: Bowel sounds 4 quadrants. Soft and nontender to palpation. No guarding or rigidity. No palpable masses. No distention. No CVA tenderness. Musculoskeletal: Patient unable to perform full range of motion of the  left shoulder.  Patient has left sided rotator cuff weakness. Neurologic:  No gross focal neurologic deficits are appreciated.  Skin:   No rash noted Other:   ED Results / Procedures / Treatments   Labs (all labs ordered are listed, but only abnormal results are displayed) Labs Reviewed - No data to display        MEDICATIONS ORDERED IN ED: Medications  HYDROcodone-acetaminophen (NORCO/VICODIN) 5-325 MG per tablet 1 tablet (1 tablet Oral Given 12/30/21 1734)  ondansetron (ZOFRAN-ODT) disintegrating tablet 4 mg (4 mg Oral Given 12/30/21 1733)     IMPRESSION / MDM / ASSESSMENT AND PLAN / ED COURSE  I reviewed the triage vital signs and the nursing notes.                              Differential diagnosis includes, but is not limited to, left shoulder pain, rotator cuff weakness  Assessment and plan Left shoulder pain Pain control 81 year old male presents to the emergency department with acute left shoulder pain after mechanical fall on 1230.  We will treat with Norco for pain until patient can be seen and evaluated by orthopedics  on Thursday.  Patient was placed in a sling for comfort.      FINAL CLINICAL IMPRESSION(S) / ED DIAGNOSES   Final diagnoses:  Acute pain of left shoulder     Rx / DC Orders   ED Discharge Orders          Ordered    HYDROcodone-acetaminophen (NORCO) 5-325 MG tablet  Every 6 hours PRN        12/30/21 1757    ondansetron (ZOFRAN-ODT) 4 MG disintegrating tablet  Every 8 hours PRN        12/30/21 1757             Note:  This document was prepared using Dragon voice recognition software and may include unintentional dictation errors.   Vallarie Mare Holly, PA-C 12/30/21 1850    Nance Pear, MD 12/30/21 1944

## 2021-12-30 NOTE — ED Triage Notes (Signed)
Pt to ED for left shoulder injury on christmas. States has already xrays, was told probably rotator cuff injury. States is having trouble getting into ortho until end of January, having difficulty with pain.  Difficulty moving arm No relief with tylenol

## 2021-12-30 NOTE — Discharge Instructions (Signed)
Keep appointment with ortho. You can take Norco for pain.

## 2022-01-01 DIAGNOSIS — S46212A Strain of muscle, fascia and tendon of other parts of biceps, left arm, initial encounter: Secondary | ICD-10-CM | POA: Diagnosis not present

## 2022-01-01 DIAGNOSIS — S46012A Strain of muscle(s) and tendon(s) of the rotator cuff of left shoulder, initial encounter: Secondary | ICD-10-CM | POA: Diagnosis not present

## 2022-01-02 ENCOUNTER — Other Ambulatory Visit: Payer: Self-pay | Admitting: Student

## 2022-01-02 DIAGNOSIS — S46012A Strain of muscle(s) and tendon(s) of the rotator cuff of left shoulder, initial encounter: Secondary | ICD-10-CM

## 2022-01-02 DIAGNOSIS — S46212A Strain of muscle, fascia and tendon of other parts of biceps, left arm, initial encounter: Secondary | ICD-10-CM

## 2022-01-13 ENCOUNTER — Ambulatory Visit
Admission: RE | Admit: 2022-01-13 | Discharge: 2022-01-13 | Disposition: A | Discharge status: Home / Self Care | Payer: Medicare HMO | Source: Ambulatory Visit | Attending: Student | Admitting: Student

## 2022-01-13 DIAGNOSIS — S46212A Strain of muscle, fascia and tendon of other parts of biceps, left arm, initial encounter: Secondary | ICD-10-CM | POA: Insufficient documentation

## 2022-01-13 DIAGNOSIS — M25512 Pain in left shoulder: Secondary | ICD-10-CM | POA: Diagnosis not present

## 2022-01-13 DIAGNOSIS — S46112A Strain of muscle, fascia and tendon of long head of biceps, left arm, initial encounter: Secondary | ICD-10-CM | POA: Diagnosis not present

## 2022-01-13 DIAGNOSIS — S46012A Strain of muscle(s) and tendon(s) of the rotator cuff of left shoulder, initial encounter: Secondary | ICD-10-CM | POA: Diagnosis not present

## 2022-01-20 DIAGNOSIS — M7522 Bicipital tendinitis, left shoulder: Secondary | ICD-10-CM | POA: Diagnosis not present

## 2022-01-20 DIAGNOSIS — M7582 Other shoulder lesions, left shoulder: Secondary | ICD-10-CM | POA: Diagnosis not present

## 2022-01-22 ENCOUNTER — Other Ambulatory Visit: Payer: Self-pay | Admitting: Surgery

## 2022-01-29 ENCOUNTER — Encounter
Admission: RE | Admit: 2022-01-29 | Discharge: 2022-01-29 | Disposition: A | Discharge status: Home / Self Care | Payer: Medicare HMO | Source: Ambulatory Visit | Attending: Surgery | Admitting: Surgery

## 2022-01-29 ENCOUNTER — Other Ambulatory Visit: Payer: Self-pay

## 2022-01-29 ENCOUNTER — Telehealth: Payer: Self-pay | Admitting: *Deleted

## 2022-01-29 DIAGNOSIS — Z79899 Other long term (current) drug therapy: Secondary | ICD-10-CM

## 2022-01-29 HISTORY — DX: Sleep apnea, unspecified: G47.30

## 2022-01-29 HISTORY — DX: Gastro-esophageal reflux disease without esophagitis: K21.9

## 2022-01-29 HISTORY — DX: Atherosclerotic heart disease of native coronary artery without angina pectoris: I25.10

## 2022-01-29 HISTORY — DX: Personal history of urinary calculi: Z87.442

## 2022-01-29 HISTORY — DX: Malignant melanoma of skin, unspecified: C43.9

## 2022-01-29 HISTORY — DX: Malignant neoplasm of prostate: C61

## 2022-01-29 HISTORY — DX: Personal history of other benign neoplasm: Z86.018

## 2022-01-29 HISTORY — DX: Unspecified osteoarthritis, unspecified site: M19.90

## 2022-01-29 NOTE — Telephone Encounter (Signed)
Request for pre-operative cardiac clearance Received: Today Karen Kitchens, NP  P Cv Div Preop Callback Request for pre-operative cardiac clearance:     1. What type of surgery is being performed?  SHOULDER ARTHROSCOPY WITH DEBRIDEMENT, DECOMPRESSION, BICEPS TENOLYSIS AND POSSIBLE ROTATOR CUFF REPAIR   2. When is this surgery scheduled?  02/05/22    3. Type of clearance being requested (medical, pharmacy, both).  BOTH     4. Are there any medications that need to be held prior to surgery?  ASA   5. Practice name and name of physician performing surgery?  Performing surgeon: Dr. Milagros Evener, MD  Requesting clearance: Honor Loh, FNP-C       6. Anesthesia type (none, local, MAC, general)?  GENERAL   7. What is the office phone and fax number?    Phone: [phone]  Fax: [phone]   ATTENTION: Unable to create telephone message as per your standard workflow. Directed by [facility] providers to send requests for cardiac clearance to this pool for appropriate distribution to provider covering pre-operative clearances.   Honor Loh, MSN, APRN, FNP-C, CEN  [facility]  Peri-operative Services Nurse Practitioner  Phone: [phone]  01/29/22 4:27 PM

## 2022-01-29 NOTE — Patient Instructions (Addendum)
Your procedure is scheduled on:02-05-22 Thursday Report to the Registration Desk on the 1st floor of the [facility].Then proceed to the 2nd floor Surgery Desk in the [facility] To find out your arrival time, please call [phone] between 1PM - 3PM on:02-04-22 Wednesday  REMEMBER: Instructions that are not followed completely may result in serious medical risk, up to and including death; or upon the discretion of your surgeon and anesthesiologist your surgery may need to be rescheduled.  Do not eat food after midnight the night before surgery.  No gum chewing, lozengers or hard candies.  You may however, drink CLEAR liquids up to 2 hours before you are scheduled to arrive for your surgery. Do not drink anything within 2 hours of your scheduled arrival time.  Clear liquids include: - water  - apple juice without pulp - gatorade (not RED, PURPLE, OR BLUE) - black coffee or tea (Do NOT add milk or creamers to the coffee or tea) Do NOT drink anything that is not on this list.  In addition, your doctor has ordered for you to drink the provided  Ensure Pre-Surgery Clear Carbohydrate Drink  Drinking this carbohydrate drink up to two hours before surgery helps to reduce insulin resistance and improve patient outcomes. Please complete drinking 2 hours prior to scheduled arrival time.  TAKE THESE MEDICATIONS THE MORNING OF SURGERY WITH A SIP OF WATER: -amLODipine (NORVASC)  -atorvastatin (LIPITOR) -atenolol (TENORMIN)  -fesoterodine (TOVIAZ)   Call Dr Nicholaus Bloom office today (01-29-22) about when you need to stop your aspirin EC 81 MG EC tablet  One week prior to surgery: Stop Anti-inflammatories (NSAIDS) such as Advil, Aleve, Ibuprofen, Motrin, Naproxen, Naprosyn and Aspirin based products such as Excedrin, Goodys Powder, BC Powder.You may however, take Tylenol if needed for pain up until the day of surgery.  Stop ANY OVER THE COUNTER supplements/vitamins NOW (01-29-22) until after  surgery  No Alcohol for 24 hours before or after surgery.  No Smoking including e-cigarettes for 24 hours prior to surgery.  No chewable tobacco products for at least 6 hours prior to surgery.  No nicotine patches on the day of surgery.  Do not use any "recreational" drugs for at least a week prior to your surgery.  Please be advised that the combination of cocaine and anesthesia may have negative outcomes, up to and including death. If you test positive for cocaine, your surgery will be cancelled.  On the morning of surgery brush your teeth with toothpaste and water, you may rinse your mouth with mouthwash if you wish. Do not swallow any toothpaste or mouthwash.  Use CHG Soap as directed on instruction sheet.  Do not wear jewelry, make-up, hairpins, clips or nail polish.  Do not wear lotions, powders, or perfumes.   Do not shave body from the neck down 48 hours prior to surgery just in case you cut yourself which could leave a site for infection.  Also, freshly shaved skin may become irritated if using the CHG soap.  Contact lenses, hearing aids and dentures may not be worn into surgery.  Do not bring valuables to the hospital. [facility] is not responsible for any missing/lost belongings or valuables.   Notify your doctor if there is any change in your medical condition (cold, fever, infection).  Wear comfortable clothing (specific to your surgery type) to the hospital.  After surgery, you can help prevent lung complications by doing breathing exercises.  Take deep breaths and cough every 1-2 hours. Your doctor may  order a device called an Incentive Spirometer to help you take deep breaths. When coughing or sneezing, hold a pillow firmly against your incision with both hands. This is called splinting. Doing this helps protect your incision. It also decreases belly discomfort.  If you are being admitted to the hospital overnight, leave your suitcase in the car. After surgery  it may be brought to your room.  If you are being discharged the day of surgery, you will not be allowed to drive home. You will need a responsible adult (18 years or older) to drive you home and stay with you that night.   If you are taking public transportation, you will need to have a responsible adult (18 years or older) with you. Please confirm with your physician that it is acceptable to use public transportation.   Please call the [facility] Dept. at [phone] if you have any questions about these instructions.  Surgery Visitation Policy:  Patients undergoing a surgery or procedure may have one family member or support person with them as long as that person is not COVID-19 positive or experiencing its symptoms.  That person may remain in the waiting area during the procedure and may rotate out with other people.  Inpatient Visitation:    Visiting hours are 7 a.m. to 8 p.m. Up to two visitors ages 16+ are allowed at one time in a patient room. The visitors may rotate out with other people during the day. Visitors must check out when they leave, or other visitors will not be allowed. One designated support person may remain overnight. The visitor must pass COVID-19 screenings, use hand sanitizer when entering and exiting the patients room and wear a mask at all times, including in the patients room. Patients must also wear a mask when staff or their visitor are in the room. Masking is required regardless of vaccination status. Your procedure is scheduled on: Report to the Registration Desk on the 1st floor of the [facility]. To find out your arrival time, please call [phone] between 1PM - 3PM on:  REMEMBER: Instructions that are not followed completely may result in serious medical risk, up to and including death; or upon the discretion of your surgeon and anesthesiologist your surgery may need to be rescheduled.  Do not eat food after midnight the night  before surgery.  No gum chewing, lozengers or hard candies.  You may however, drink CLEAR liquids up to 2 hours before you are scheduled to arrive for your surgery. Do not drink anything within 2 hours of your scheduled arrival time.  Clear liquids include: - water  - apple juice without pulp - gatorade (not RED, PURPLE, OR BLUE) - black coffee or tea (Do NOT add milk or creamers to the coffee or tea) Do NOT drink anything that is not on this list.  Type 1 and Type 2 diabetics should only drink water.  In addition, your doctor has ordered for you to drink the provided  Ensure Pre-Surgery Clear Carbohydrate Drink  Gatorade G2 Drinking this carbohydrate drink up to two hours before surgery helps to reduce insulin resistance and improve patient outcomes. Please complete drinking 2 hours prior to scheduled arrival time.  TAKE THESE MEDICATIONS THE MORNING OF SURGERY WITH A SIP OF WATER:  (take one the night before and one on the morning of surgery - helps to prevent nausea after surgery.)  Use inhalers on the day of surgery and bring to the hospital.  **Follow new  guidelines for insulin and diabetes medications.**  Follow recommendations from Cardiologist, Pulmonologist or PCP regarding stopping Aspirin, Coumadin, Plavix, Eliquis, Pradaxa, or Pletal.  One week prior to surgery: Stop Anti-inflammatories (NSAIDS) such as Advil, Aleve, Ibuprofen, Motrin, Naproxen, Naprosyn and Aspirin based products such as Excedrin, Goodys Powder, BC Powder. Stop ANY OVER THE COUNTER supplements until after surgery. You may however, continue to take Tylenol if needed for pain up until the day of surgery.  No Alcohol for 24 hours before or after surgery.  No Smoking including e-cigarettes for 24 hours prior to surgery.  No chewable tobacco products for at least 6 hours prior to surgery.  No nicotine patches on the day of surgery.  Do not use any "recreational" drugs for at least a week prior to your  surgery.  Please be advised that the combination of cocaine and anesthesia may have negative outcomes, up to and including death. If you test positive for cocaine, your surgery will be cancelled.  On the morning of surgery brush your teeth with toothpaste and water, you may rinse your mouth with mouthwash if you wish. Do not swallow any toothpaste or mouthwash.  Use CHG Soap or wipes as directed on instruction sheet.  Do not wear jewelry, make-up, hairpins, clips or nail polish.  Do not wear lotions, powders, or perfumes.   Do not shave body from the neck down 48 hours prior to surgery just in case you cut yourself which could leave a site for infection.  Also, freshly shaved skin may become irritated if using the CHG soap.  Contact lenses, hearing aids and dentures may not be worn into surgery.  Do not bring valuables to the hospital. [facility] is not responsible for any missing/lost belongings or valuables.   Total Shoulder Arthroplasty:  use Benzolyl Peroxide 5% Gel as directed on instruction sheet.  Fleets enema or bowel prep as directed.  Bring your C-PAP to the hospital with you in case you may have to spend the night.   Notify your doctor if there is any change in your medical condition (cold, fever, infection).  Wear comfortable clothing (specific to your surgery type) to the hospital.  After surgery, you can help prevent lung complications by doing breathing exercises.  Take deep breaths and cough every 1-2 hours. Your doctor may order a device called an Incentive Spirometer to help you take deep breaths. When coughing or sneezing, hold a pillow firmly against your incision with both hands. This is called splinting. Doing this helps protect your incision. It also decreases belly discomfort.  If you are being admitted to the hospital overnight, leave your suitcase in the car. After surgery it may be brought to your room.  If you are being discharged the day of  surgery, you will not be allowed to drive home. You will need a responsible adult (18 years or older) to drive you home and stay with you that night.   If you are taking public transportation, you will need to have a responsible adult (18 years or older) with you. Please confirm with your physician that it is acceptable to use public transportation.   Please call the [facility] Dept. at [phone] if you have any questions about these instructions.  Surgery Visitation Policy:  Patients undergoing a surgery or procedure may have one family member or support person with them as long as that person is not COVID-19 positive or experiencing its symptoms.  That person may remain in the waiting area  during the procedure and may rotate out with other people.  Inpatient Visitation:    Visiting hours are 7 a.m. to 8 p.m. Up to two visitors ages 16+ are allowed at one time in a patient room. The visitors may rotate out with other people during the day. Visitors must check out when they leave, or other visitors will not be allowed. One designated support person may remain overnight. The visitor must pass COVID-19 screenings, use hand sanitizer when entering and exiting the patients room and wear a mask at all times, including in the patients room. Patients must also wear a mask when staff or their visitor are in the room. Masking is required regardless of vaccination status.

## 2022-01-30 ENCOUNTER — Encounter
Admission: RE | Admit: 2022-01-30 | Discharge: 2022-01-30 | Disposition: A | Discharge status: Home / Self Care | Payer: Medicare HMO | Source: Ambulatory Visit | Attending: Surgery | Admitting: Surgery

## 2022-01-30 DIAGNOSIS — Z01812 Encounter for preprocedural laboratory examination: Secondary | ICD-10-CM | POA: Insufficient documentation

## 2022-01-30 DIAGNOSIS — Z79899 Other long term (current) drug therapy: Secondary | ICD-10-CM | POA: Diagnosis not present

## 2022-01-30 LAB — POTASSIUM: Potassium: 3.5 mmol/L (ref 3.5–5.1)

## 2022-01-30 NOTE — Telephone Encounter (Signed)
Left a message for the patient to call back and speak to the on-call preop APP of the day.  Surgeon's office requested [place] to hold his aspirin.  Last cardiac catheterization performed in 2018 demonstrated occluded PDA vessel with distal collaterals, moderate OM1, PL and diagonal lesions that were managed medically.  He is no longer on Plavix therapy.  Patient was previously cleared for ENT procedure in 2020, per cardiac clearance letter at the time, "He is at higher but acceptable risk for procedure. He does have severe underlying coronary disease, certainly a risk coming off the aspirin. If he is willing to accept the risk, okay to stop aspirin 2 days prior, 5 days after if that is what ENT needs then restart low-dose aspirin."  Both the recommendation be the same for this shoulder repair as well regarding aspirin?  I have left a message for the patient to call back and speak to the on-call preop APP of the day.  Please forward your recommendation regarding aspirin to [facility] PREOP

## 2022-01-30 NOTE — Telephone Encounter (Signed)
Patient called back.  I spoke with Gregory [name], he apparently talked to the surgeon's office this morning and was told that he does not need to stop the aspirin.  Talking with the patient, his functional ability is mainly limited by hip issue rather than cardiac issue.  He denies any major chest discomfort recently.  If he walks a long distance, he may get short of breath and have to stop due to hip pain.  I am not confident that he is able to accomplish more than 4 METS of activity, however the previous cardiac catheterization in 2018 revealed occluded PDA but moderate disease otherwise.  Given the fact that he is asymptomatic without any chest discomfort, would you be okay with clearing the patient for the upcoming shoulder surgery?  Again, according to patient, the surgeon's office told him this morning that he does not need to stop the aspirin

## 2022-02-02 ENCOUNTER — Encounter: Payer: Self-pay | Admitting: Surgery

## 2022-02-02 NOTE — Telephone Encounter (Signed)
° °  Patient Name: Gregory [name]  DOB: August 23, 1941 MRN: [id number]  Primary Cardiologist: Ida Rogue, MD  Chart reviewed as part of pre-operative protocol coverage. Per Dr. Rockey Situ, he is felt to be at acceptable risk for surgery and can stay on asa 81 daily. (As noted below, [facility] confirmed with the patient that he did not need to stop the aspirin.). Will route this bundled recommendation to requesting provider via Epic fax function. Please call with questions.  Charlie Pitter, PA-C 02/02/2022, 12:18 PM

## 2022-02-02 NOTE — Progress Notes (Signed)
Perioperative Services  Pre-Admission/Anesthesia Testing Clinical Review  Date: 02/02/22  Patient Demographics:  Name: Gregory [name] DOB:   05/29/1941 MRN:   [id number]  Planned Surgical Procedure(s):    Case: [id number] Date/Time: 02/05/22 1328   Procedure: SHOULDER ARTHROSCOPY WITH DEBRIDEMENT, DECOMPRESSION, BICEPS TENOLYSIS AND POSSIBLE ROTATOR CUFF REPAIR (Left)   Anesthesia type: Choice   Pre-op diagnosis:      Rotator cuff tendinitis, left  M75.82     Biceps tendonitis on left  M75.22   Location: [facility] OR ROOM 03 / [place] [facility]   Surgeons: Corky Mull, MD   NOTE: Available PAT nursing documentation and vital signs have been reviewed. Clinical nursing staff has updated patient's PMH/PSHx, current medication list, and drug allergies/intolerances to ensure comprehensive history available to assist in medical decision making as it pertains to the aforementioned surgical procedure and anticipated anesthetic course. Extensive review of available clinical information performed. Gregory [name] PMH and PSHx updated with any diagnoses/procedures that  may have been inadvertently omitted during his intake with the pre-admission testing department's nursing staff.  Clinical Discussion:  Gregory [name] is a 81 y.o. male who is submitted for pre-surgical anesthesia review and clearance prior to him undergoing the above procedure. Patient has never been a smoker.\ Pertinent PMH includes: CAD, NSTEMI, aortic root dilatation, diastolic dysfunction, HTN, HLD, acoustic neuroma, OSAH (does not require nocturnal PAP therapy), GERD (no daily Tx), OA, nephrolithiasis, prostate cancer.  Patient is followed by cardiology Rockey Situ, MD). He was last seen in the cardiology clinic on 10/29/2021; notes reviewed.  At the time of his clinic visit, patient doing well overall from a cardiovascular perspective.  He denied any episodes of chest pain, shortness of breath, PND, orthopnea,  palpitations, significant peripheral edema, or presyncope/syncope.  Patient with some vertiginous symptoms associated with positional changes.  PMH significant for cardiovascular diagnoses.  Patient suffered an NSTEMI on 10/06/2017.  Diagnostic left heart catheterization was performed revealing multivessel CAD; 60% D2, 100% 2nd RPLB, 60% 3rd RPLB, 40% proximal LAD, 40% mid LAD, and 60% OM1.  Intervention was deferred opting for medical management.  TTE performed on 10/06/2017 revealed a normal left ventricular systolic function with an EF of 55-60%.  Diastolic Doppler parameters consistent with impaired relaxation (G1DD). There is no evidence of significant valvular regurgitation or transvalvular gradient suggestive of stenosis.  Aortic root noted to be mildly dilated at 41 mm.  Following MI, patient completed a full year of daily DAPT therapy (ASA + clopidogrel).  Clopidogrel has since been discontinued and patient continues on daily low-dose ASA.  Blood pressure mildly elevated at 140/70 on currently prescribed CCB, beta-blocker, diuretic, and ARB therapies.  Patient is on a statin for his HLD.  He is not diabetic. Functional capacity, as defined by DASI, is documented as being >/= 4 METS.  No changes were made to his medication regimen.  Patient follow-up with outpatient cardiology in 1 year or sooner if needed.  Gregory [name] is scheduled for an elective LEFT SHOULDER ARTHROSCOPY WITH DEBRIDEMENT, DECOMPRESSION, BICEPS TENOLYSIS AND POSSIBLE ROTATOR CUFF REPAIR 02/05/2022 with Dr. Milagros Evener, MD. Given patient's past medical history significant for cardiovascular diagnoses, presurgical cardiac clearance was sought by the PAT team. Per cardiology, "based ACC/AHA guidelines, the patient's past medical history, and the amount of time since his last clinic visit, this patient would be at an overall ACCEPTABLE risk for the planned procedure without further cardiovascular testing or intervention at this  time".  Again, this patient  is on daily low-dose ASA therapy.  Cardiology requested that patient remain on his ASA throughout the perioperative course.  Patient denies previous perioperative complications with anesthesia in the past. In review of the available records, it is noted that patient underwent a general anesthetic course at [facility] (ASA III) in 10/2019 without documented complications.   Vitals with BMI 12/30/2021 12/30/2021 12/30/2021  Height - - _0   Weight - - 155 lbs  BMI - - 76.14  Systolic 709 295 -  Diastolic 79 78 -  Pulse 58 63 -    Providers/Specialists:   NOTE: Primary physician provider listed below. Patient may have been seen by APP or partner within same practice.   PROVIDER ROLE / SPECIALTY LAST OV  Poggi, Marshall Cork, MD Orthopedics (Surgeon) 01/20/2022  Tracie Harrier, MD Primary Care Provider 12/01/2021  Ida Rogue, MD Cardiology 10/29/2021   Allergies:  Iodine and Shellfish allergy  Current Home Medications:   No current facility-administered medications for this encounter.    amLODipine (NORVASC) 10 MG tablet   aspirin EC 81 MG EC tablet   atenolol (TENORMIN) 25 MG tablet   atorvastatin (LIPITOR) 40 MG tablet   fesoterodine (TOVIAZ) 8 MG TB24 tablet   hydrochlorothiazide (HYDRODIURIL) 25 MG tablet   nitroGLYCERIN (NITROSTAT) 0.4 MG SL tablet   olmesartan (BENICAR) 40 MG tablet   History:   Past Medical History:  Diagnosis Date   Actinic keratosis    Aortic root dilatation ([place]) 10/06/2017   a.) TTE 10/06/2017: Ao root 5m.   Arthritis    Basal cell carcinoma 08/21/2009   Left neck. Excised: 10/03/2009, margins free.   Coronary artery disease 10/06/2017   a.) LHC 10/06/2017: 60% D2, 100% 2nd RPLB, 60% 3rd RPLB, 40% pLAD, 40% mLAD, 60% OM1; Rx mgmt.   Diastolic dysfunction 174/73/4037  a.) TTE 10/06/2017: EF 55-60%; G1DD.   Dysplastic nevus 08/23/2008   Right vertex. Moderate to severe atypia, bordering on early evolving MMis.    Dysplastic nevus 02/21/2010   Right vertex. Moderate to severe atypia, bordering on early evolving MMis.   GERD (gastroesophageal reflux disease)    History of acoustic neuroma 02/2004   a.) s/p crainotomy for removal --> complicated by CSF leak requiring 2nd crainotomy for internatal auditory canal closure, closure or dura, and placement of lumbar drain on 06/13/2004   History of kidney stones    HLD (hyperlipidemia)    Hypertension    Loss of equilibrium    left side only due to benign brain stem tumor in 2005   [place]alignant melanoma [place]    [place] ([place]ST elevated [place] infarction[place]   [place] 60% D2, 100% 2nd RPLB, 60% 3rd RPLB, 40% pLAD, 40% mLAD, 60% OM1; intervention deferred opting for medical management   OSA (obstructive sleep apnea)    a.) does not require nocturnal PAP therapy   [place] cancer [place]    Rheumatic fever    Squamous cell carcinoma of skin 07/27/2018   [facility]  Past Surgical History:  Procedure Laterality Date   BLADDER SURGERY  12/21/2010   [place]. [place]   Procedure: CRANIOTOMY FOR ACOUSTIC NEUROMA REMOVAL; Location: [facility]   CRANIOTOMY FOR REPAIR DURAL / CSF LEAK N/A 06/13/2004   Procedure: SECONDARY SUBOCCIPITAL CRANIOTOMY WITH CLOSURE OF INTERNAL AUDITORY CANAL, CLOSURE OF DURA, AND PLACEMENT OF LUMBAR DRAIN; Location: [facility]   CYSTOSCOPY WITH RETROGRADE URETHROGRAM  03/16/2016  Procedure: CYSTOSCOPY WITH RETROGRADE URETHROGRAM;  Surgeon: Hollice Espy, MD;  Location: [facility] ORS;  Service: Urology;;   CYSTOSCOPY WITH STENT PLACEMENT Left 02/23/2016   Procedure: CYSTOSCOPY WITH STENT PLACEMENT;  Surgeon: Dereck Leep, MD;  Location: [facility] ORS;  Service: Urology;  Laterality: Left;   CYSTOSCOPY WITH STENT PLACEMENT Left 03/16/2016   Procedure: CYSTOSCOPY WITH STENT PLACEMENT/EXCHANGE;  Surgeon: Hollice Espy, MD;  Location: [facility] ORS;   Service: Urology;  Laterality: Left;   LEFT HEART CATH AND CORONARY ANGIOGRAPHY N/A 10/06/2017   Procedure: LEFT HEART CATH AND CORONARY ANGIOGRAPHY;  Surgeon: Minna Merritts, MD;  Location: [place] [facility] LAB;  Service: Cardiovascular;  Laterality: N/A;   MELANOMA EXCISION     PROSTATECTOMY  12/21/2001   Dr. Madelin Headings, [place] EXTRACTION WITH BASKET  03/16/2016   Procedure: STONE EXTRACTION WITH BASKET;  Surgeon: Hollice Espy, MD;  Location: [facility] ORS;  Service: Urology;;   Family History  Problem Relation Age of Onset   CAD Father    Polycystic kidney disease Father    Diabetes Mellitus II Neg Hx    Hypertension Neg Hx    Social History   Tobacco Use   Smoking status: Never   Smokeless tobacco: Never  Vaping Use   Vaping Use: Never used  Substance Use Topics   Alcohol use: No   Drug use: No    Pertinent Clinical Results:  LABS: Labs reviewed: Acceptable for surgery.   Ref Range & Units 11/24/2021  WBC (White Blood Cell Count) 4.1 - 10.2 103/uL 10.0   RBC (Red Blood Cell Count) 4.69 - 6.13 106/uL 5.14   Hemoglobin 14.1 - 18.1 gm/dL 16.0   Hematocrit 40.0 - 52.0 % 47.1   MCV (Mean Corpuscular Volume) 80.0 - 100.0 fl 91.6   MCH (Mean Corpuscular Hemoglobin) 27.0 - 31.2 pg 31.1   MCHC (Mean Corpuscular Hemoglobin Concentration) 32.0 - 36.0 gm/dL 34.0   Platelet Count 150 - 450 103/uL 258   RDW-CV (Red Cell Distribution Width) 11.6 - 14.8 % 14.4   MPV (Mean Platelet Volume) 9.4 - 12.4 fl 10.9   Neutrophils 1.50 - 7.80 103/uL 6.93   Lymphocytes 1.00 - 3.60 103/uL 2.16   Monocytes 0.00 - 1.50 103/uL 0.54   Eosinophils 0.00 - 0.55 103/uL 0.27   Basophils 0.00 - 0.09 103/uL 0.06   Neutrophil % 32.0 - 70.0 % 69.5   Lymphocyte % 10.0 - 50.0 % 21.7   Monocyte % 4.0 - 13.0 % 5.4   Eosinophil % 1.0 - 5.0 % 2.7   Basophil% 0.0 - 2.0 % 0.6   Immature Granulocyte % <=0.7 % 0.1   Immature Granulocyte Count <=0.06 10^3/L 0.01   Resulting Agency  [place] - LAB  Specimen Collected: 11/24/21 09:25 Last Resulted: 11/24/21 09:46  Received From: [place]  Result Received: 12/30/21 12:48    Ref Range & Units 11/24/2021  Glucose 70 - 110 mg/dL 175 High    Sodium 136 - 145 mmol/L 142   Potassium 3.6 - 5.1 mmol/L 4.1   Chloride 97 - 109 mmol/L 105   Carbon Dioxide (CO2) 22.0 - 32.0 mmol/L 27.7   Urea Nitrogen (BUN) 7 - 25 mg/dL 15   Creatinine 0.7 - 1.3 mg/dL 1.1   Glomerular Filtration Rate (eGFR), MDRD Estimate >60 mL/min/1.73sq m 65   Calcium 8.7 - 10.3 mg/dL 9.3   AST  8 - 39 U/L 20   ALT  6 - 57 U/L 25  Alk Phos (alkaline Phosphatase) 34 - 104 U/L 67   Albumin 3.5 - 4.8 g/dL 4.1   Bilirubin, Total 0.3 - 1.2 mg/dL 0.8   Protein, Total 6.1 - 7.9 g/dL 6.8   A/G Ratio 1.0 - 5.0 gm/dL 1.5   Resulting Agency  [facility] - LAB  Specimen Collected: 11/24/21 09:25 Last Resulted: 11/24/21 14:48  Received From: [place]  Result Received: 12/30/21 12:48    ECG: Date: 10/29/2021 Time ECG obtained: 0901 AM Rate: 74 bpm Rhythm: Sinus rhythm with first-degree AV block Axis (leads I and aVF): Normal Intervals: PR 260 ms. QRS 80 ms. QTc 430 ms. ST segment and T wave changes: No evidence of acute ST segment elevation or depression.  Evidence of an age undetermined inferior infarct present Comparison: Similar to previous tracing obtained on 10/23/2020   IMAGING / PROCEDURES: MRI SHOULDER LEFT WITHOUT CONTRAST performed on 01/13/2022 Mild tendinosis of the supraspinatus tendon Mild tendinosis of the subscapularis tendon Severe tendinosis of the intra-articular portion of the proximal extra-articular portion of the long head of biceps tendon with a high-grade partial-thickness tear Thickening of the inferior joint capsule as can be seen with adhesive capsulitis  CT CHEST WITHOUT CONTRAST performed on 07/15/2021 No acute abnormality in the chest Coronary artery calcifications Subtle areas  of relatively increased lucencies scattered throughout the BILATERAL lungs likely represent small areas of air trapping in the setting of small airway disease Mild subpleural reticulation in the periphery of the mid and lower lungs likely represents early fibrotic change BILATERAL gynecomastia Stable circumcised water attenuation cystic lesions in the liver consistent with hepatic cyst Gallbladder surgically absent Stable nonspecific exophytic lesion from the upper pole of the LEFT kidney which is incompletely evaluated in the absence of intravenous contrast, but demonstrates no significant interval change across multiple prior studies dating back to 7793 and almost certainly represents a benign complex cyst  TRANSTHORACIC ECHOCARDIOGRAM performed on 10/06/2017 Normal left ventricular systolic function with an EF of 55-60% Moderate asymmetric hypertrophy of the septum No regional wall motion abnormalities Diastolic Doppler parameters consistent with abnormal relaxation (G1DD) Aortic root mildly dilated at 41 mm Left atrium mildly dilated  LEFT HEART CATHETERIZATION AND CORONARY ANGIOGRAPHY performed on 10/06/2017 LVEF 55% Multivessel CAD 60% stenosis of the second diagonal 100% stenosis of the second RPLB 60% stenosis of the third RPLB 40% stenosis of the proximal LAD 40% stenosis of the mid LAD 60% stenosis of the first marginal Recommendations: Moderate diffuse disease in multiple vessels.   Occluded PDA has collaterals.  Diffusely disease likely with thrombus.  Intervention of PDA with little perceived benefit Aggressive medical management    Impression and Plan:  NICODEMUS DENK has been referred for pre-anesthesia review and clearance prior to him undergoing the planned anesthetic and procedural courses. Available labs, pertinent testing, and imaging results were personally reviewed by me. This patient has been appropriately cleared by cardiology with an overall LOW risk of  significant perioperative cardiovascular complications.  Based on clinical review performed today (02/02/22), barring any significant acute changes in the patient's overall condition, it is anticipated that he will be able to proceed with the planned surgical intervention. Any acute changes in clinical condition may necessitate his procedure being postponed and/or cancelled. Patient will meet with anesthesia team (MD and/or CRNA) on the day of his procedure for preoperative evaluation/assessment. Questions regarding anesthetic course will be fielded at that time.   Pre-surgical instructions were reviewed with the patient during his PAT appointment  and questions were fielded by PAT clinical staff. Patient was advised that if any questions or concerns arise prior to his procedure then he should return a call to PAT and/or his surgeon's office to discuss.  Honor Loh, MSN, APRN, FNP-C, CEN [facility]  Peri-operative Services Nurse Practitioner Phone: [phone] Fax: [phone] 02/02/22 11:38 AM  NOTE: This note has been prepared using Dragon dictation software. Despite my best ability to proofread, there is always the potential that unintentional transcriptional errors may still occur from this process.

## 2022-02-04 MED ORDER — LACTATED RINGERS IV SOLN: INTRAVENOUS | Status: DC

## 2022-02-04 MED ORDER — CEFAZOLIN SODIUM-DEXTROSE 2-4 GM/100ML-% IV SOLN
2.0000 g | INTRAVENOUS | Status: AC
  Administered 2022-02-05: 2 g via INTRAVENOUS

## 2022-02-04 MED ORDER — FAMOTIDINE 20 MG PO TABS: 20.0000 mg | ORAL_TABLET | Freq: Once | ORAL | Status: AC

## 2022-02-04 MED ORDER — CHLORHEXIDINE GLUCONATE 0.12 % MT SOLN: 15.0000 mL | Freq: Once | OROMUCOSAL | Status: DC

## 2022-02-04 MED ORDER — ORAL CARE MOUTH RINSE: 15.0000 mL | Freq: Once | OROMUCOSAL | Status: DC

## 2022-02-05 ENCOUNTER — Encounter
Admission: RE | Disposition: A | Discharge status: Home / Self Care | Payer: Self-pay | Source: Home / Self Care | Attending: Surgery

## 2022-02-05 ENCOUNTER — Encounter: Payer: Self-pay | Admitting: Surgery

## 2022-02-05 ENCOUNTER — Ambulatory Visit
Admission: RE | Admit: 2022-02-05 | Discharge: 2022-02-05 | Disposition: A | Discharge status: Home / Self Care | Payer: Medicare HMO | Attending: Surgery | Admitting: Surgery

## 2022-02-05 ENCOUNTER — Ambulatory Visit: Payer: Medicare HMO | Admitting: Urgent Care

## 2022-02-05 ENCOUNTER — Other Ambulatory Visit: Payer: Self-pay

## 2022-02-05 ENCOUNTER — Ambulatory Visit: Payer: Medicare HMO

## 2022-02-05 DIAGNOSIS — S46102A Unspecified injury of muscle, fascia and tendon of long head of biceps, left arm, initial encounter: Secondary | ICD-10-CM | POA: Diagnosis not present

## 2022-02-05 DIAGNOSIS — M7582 Other shoulder lesions, left shoulder: Secondary | ICD-10-CM | POA: Diagnosis not present

## 2022-02-05 DIAGNOSIS — M7522 Bicipital tendinitis, left shoulder: Secondary | ICD-10-CM | POA: Diagnosis not present

## 2022-02-05 DIAGNOSIS — I2510 Atherosclerotic heart disease of native coronary artery without angina pectoris: Secondary | ICD-10-CM | POA: Diagnosis not present

## 2022-02-05 DIAGNOSIS — M25512 Pain in left shoulder: Secondary | ICD-10-CM | POA: Diagnosis not present

## 2022-02-05 DIAGNOSIS — G8929 Other chronic pain: Secondary | ICD-10-CM

## 2022-02-05 DIAGNOSIS — M7542 Impingement syndrome of left shoulder: Secondary | ICD-10-CM | POA: Diagnosis not present

## 2022-02-05 DIAGNOSIS — S46012A Strain of muscle(s) and tendon(s) of the rotator cuff of left shoulder, initial encounter: Secondary | ICD-10-CM | POA: Insufficient documentation

## 2022-02-05 DIAGNOSIS — W01198A Fall on same level from slipping, tripping and stumbling with subsequent striking against other object, initial encounter: Secondary | ICD-10-CM | POA: Diagnosis not present

## 2022-02-05 DIAGNOSIS — X58XXXA Exposure to other specified factors, initial encounter: Secondary | ICD-10-CM | POA: Insufficient documentation

## 2022-02-05 DIAGNOSIS — E785 Hyperlipidemia, unspecified: Secondary | ICD-10-CM | POA: Diagnosis not present

## 2022-02-05 DIAGNOSIS — S43432A Superior glenoid labrum lesion of left shoulder, initial encounter: Secondary | ICD-10-CM | POA: Diagnosis not present

## 2022-02-05 DIAGNOSIS — G8918 Other acute postprocedural pain: Secondary | ICD-10-CM | POA: Diagnosis not present

## 2022-02-05 DIAGNOSIS — G4733 Obstructive sleep apnea (adult) (pediatric): Secondary | ICD-10-CM | POA: Diagnosis not present

## 2022-02-05 DIAGNOSIS — I10 Essential (primary) hypertension: Secondary | ICD-10-CM | POA: Diagnosis not present

## 2022-02-05 DIAGNOSIS — I252 Old myocardial infarction: Secondary | ICD-10-CM | POA: Diagnosis not present

## 2022-02-05 HISTORY — DX: Hyperlipidemia, unspecified: E78.5

## 2022-02-05 HISTORY — DX: Malignant neoplasm of prostate: C61

## 2022-02-05 HISTORY — DX: Obstructive sleep apnea (adult) (pediatric): G47.33

## 2022-02-05 HISTORY — DX: Rheumatic fever without heart involvement: I00

## 2022-02-05 HISTORY — PX: SHOULDER ARTHROSCOPY WITH SUBACROMIAL DECOMPRESSION, ROTATOR CUFF REPAIR AND BICEP TENDON REPAIR: SHX5687

## 2022-02-05 SURGERY — SHOULDER ARTHROSCOPY WITH SUBACROMIAL DECOMPRESSION, ROTATOR CUFF REPAIR AND BICEP TENDON REPAIR
Anesthesia: General | Laterality: Left

## 2022-02-05 MED ORDER — MIDAZOLAM HCL 2 MG/2ML IJ SOLN
INTRAMUSCULAR | Status: AC
  Administered 2022-02-05: 1 mg via INTRAVENOUS
  Filled 2022-02-05: qty 2

## 2022-02-05 MED ORDER — SUGAMMADEX SODIUM 200 MG/2ML IV SOLN
INTRAVENOUS | Status: DC | PRN
  Administered 2022-02-05: 100 mg via INTRAVENOUS
  Administered 2022-02-05: 400 mg via INTRAVENOUS

## 2022-02-05 MED ORDER — BUPIVACAINE HCL (PF) 0.5 % IJ SOLN
INTRAMUSCULAR | Status: DC | PRN
  Administered 2022-02-05: 10 mL via PERINEURAL

## 2022-02-05 MED ORDER — KETOROLAC TROMETHAMINE 15 MG/ML IJ SOLN
INTRAMUSCULAR | Status: AC
  Filled 2022-02-05: qty 1

## 2022-02-05 MED ORDER — ACETAMINOPHEN 10 MG/ML IV SOLN: 1000.0000 mg | Freq: Once | INTRAVENOUS | Status: DC | PRN

## 2022-02-05 MED ORDER — FENTANYL CITRATE (PF) 100 MCG/2ML IJ SOLN
INTRAMUSCULAR | Status: DC | PRN
  Administered 2022-02-05: 25 ug via INTRAVENOUS

## 2022-02-05 MED ORDER — BUPIVACAINE LIPOSOME 1.3 % IJ SUSP
INTRAMUSCULAR | Status: AC
  Filled 2022-02-05: qty 10

## 2022-02-05 MED ORDER — AMLODIPINE BESYLATE 10 MG PO TABS
10.0000 mg | ORAL_TABLET | ORAL | Status: DC
Start: 2022-02-05 — End: 2022-06-01

## 2022-02-05 MED ORDER — DEXAMETHASONE SODIUM PHOSPHATE 10 MG/ML IJ SOLN
INTRAMUSCULAR | Status: DC | PRN
  Administered 2022-02-05: 10 mg via INTRAVENOUS

## 2022-02-05 MED ORDER — ACETAMINOPHEN 10 MG/ML IV SOLN
INTRAVENOUS | Status: DC | PRN
  Administered 2022-02-05: 1000 mg via INTRAVENOUS

## 2022-02-05 MED ORDER — FENTANYL CITRATE PF 50 MCG/ML IJ SOSY: 50.0000 ug | PREFILLED_SYRINGE | Freq: Once | INTRAMUSCULAR | Status: AC

## 2022-02-05 MED ORDER — BUPIVACAINE-EPINEPHRINE (PF) 0.5% -1:200000 IJ SOLN
INTRAMUSCULAR | Status: AC
  Filled 2022-02-05: qty 30

## 2022-02-05 MED ORDER — DEXMEDETOMIDINE HCL IN NACL 200 MCG/50ML IV SOLN
INTRAVENOUS | Status: DC | PRN
  Administered 2022-02-05: 8 ug via INTRAVENOUS

## 2022-02-05 MED ORDER — KETOROLAC TROMETHAMINE 15 MG/ML IJ SOLN
15.0000 mg | Freq: Once | INTRAMUSCULAR | Status: AC
  Administered 2022-02-05: 15 mg via INTRAVENOUS

## 2022-02-05 MED ORDER — FENTANYL CITRATE PF 50 MCG/ML IJ SOSY
PREFILLED_SYRINGE | INTRAMUSCULAR | Status: AC
  Administered 2022-02-05: 50 ug via INTRAVENOUS
  Filled 2022-02-05: qty 1

## 2022-02-05 MED ORDER — ATENOLOL 25 MG PO TABS: 25.0000 mg | ORAL_TABLET | ORAL | Status: DC

## 2022-02-05 MED ORDER — OXYCODONE HCL 5 MG/5ML PO SOLN: 5.0000 mg | Freq: Once | ORAL | Status: DC | PRN

## 2022-02-05 MED ORDER — SODIUM CHLORIDE 0.9 % IV SOLN: INTRAVENOUS | Status: DC

## 2022-02-05 MED ORDER — ACETAMINOPHEN 10 MG/ML IV SOLN
INTRAVENOUS | Status: AC
  Filled 2022-02-05: qty 100

## 2022-02-05 MED ORDER — ONDANSETRON HCL 4 MG/2ML IJ SOLN: 4.0000 mg | Freq: Four times a day (QID) | INTRAMUSCULAR | Status: DC | PRN

## 2022-02-05 MED ORDER — ROCURONIUM BROMIDE 100 MG/10ML IV SOLN
INTRAVENOUS | Status: DC | PRN
  Administered 2022-02-05: 10 mg via INTRAVENOUS
  Administered 2022-02-05: 50 mg via INTRAVENOUS

## 2022-02-05 MED ORDER — BUPIVACAINE-EPINEPHRINE 0.5% -1:200000 IJ SOLN
INTRAMUSCULAR | Status: DC | PRN
  Administered 2022-02-05: 30 mL

## 2022-02-05 MED ORDER — ONDANSETRON HCL 4 MG/2ML IJ SOLN
INTRAMUSCULAR | Status: DC | PRN
  Administered 2022-02-05: 4 mg via INTRAVENOUS

## 2022-02-05 MED ORDER — PROPOFOL 10 MG/ML IV BOLUS
INTRAVENOUS | Status: DC | PRN
  Administered 2022-02-05: 120 mg via INTRAVENOUS

## 2022-02-05 MED ORDER — FENTANYL CITRATE (PF) 100 MCG/2ML IJ SOLN: 25.0000 ug | INTRAMUSCULAR | Status: DC | PRN

## 2022-02-05 MED ORDER — MIDAZOLAM HCL 2 MG/2ML IJ SOLN: 1.0000 mg | Freq: Once | INTRAMUSCULAR | Status: AC

## 2022-02-05 MED ORDER — CHLORHEXIDINE GLUCONATE 0.12 % MT SOLN
OROMUCOSAL | Status: AC
  Filled 2022-02-05: qty 15

## 2022-02-05 MED ORDER — CEFAZOLIN SODIUM-DEXTROSE 2-4 GM/100ML-% IV SOLN
INTRAVENOUS | Status: AC
  Filled 2022-02-05: qty 100

## 2022-02-05 MED ORDER — FAMOTIDINE 20 MG PO TABS
ORAL_TABLET | ORAL | Status: AC
  Administered 2022-02-05: 20 mg via ORAL
  Filled 2022-02-05: qty 1

## 2022-02-05 MED ORDER — BUPIVACAINE LIPOSOME 1.3 % IJ SUSP
INTRAMUSCULAR | Status: DC | PRN
  Administered 2022-02-05: 10 mL via PERINEURAL

## 2022-02-05 MED ORDER — METOCLOPRAMIDE HCL 5 MG/ML IJ SOLN: 5.0000 mg | Freq: Three times a day (TID) | INTRAMUSCULAR | Status: DC | PRN

## 2022-02-05 MED ORDER — OXYCODONE HCL 5 MG PO TABS: 5.0000 mg | ORAL_TABLET | Freq: Once | ORAL | Status: DC | PRN

## 2022-02-05 MED ORDER — ONDANSETRON HCL 4 MG/2ML IJ SOLN: 4.0000 mg | Freq: Once | INTRAMUSCULAR | Status: DC | PRN

## 2022-02-05 MED ORDER — LIDOCAINE HCL (CARDIAC) PF 100 MG/5ML IV SOSY
PREFILLED_SYRINGE | INTRAVENOUS | Status: DC | PRN
  Administered 2022-02-05: 100 mg via INTRAVENOUS

## 2022-02-05 MED ORDER — ONDANSETRON HCL 4 MG PO TABS: 4.0000 mg | ORAL_TABLET | Freq: Four times a day (QID) | ORAL | Status: DC | PRN

## 2022-02-05 MED ORDER — EPINEPHRINE PF 1 MG/ML IJ SOLN
INTRAMUSCULAR | Status: AC
  Filled 2022-02-05: qty 2

## 2022-02-05 MED ORDER — OXYCODONE HCL 5 MG PO TABS: 5.0000 mg | ORAL_TABLET | ORAL | Status: DC | PRN

## 2022-02-05 MED ORDER — METOCLOPRAMIDE HCL 10 MG PO TABS: 5.0000 mg | ORAL_TABLET | Freq: Three times a day (TID) | ORAL | Status: DC | PRN

## 2022-02-05 MED ORDER — OLMESARTAN MEDOXOMIL 40 MG PO TABS: 40.0000 mg | ORAL_TABLET | ORAL | Status: DC

## 2022-02-05 MED ORDER — PROPOFOL 1000 MG/100ML IV EMUL
INTRAVENOUS | Status: AC
  Filled 2022-02-05: qty 100

## 2022-02-05 MED ORDER — FENTANYL CITRATE (PF) 100 MCG/2ML IJ SOLN
INTRAMUSCULAR | Status: AC
  Filled 2022-02-05: qty 2

## 2022-02-05 MED ORDER — EPHEDRINE SULFATE (PRESSORS) 50 MG/ML IJ SOLN
INTRAMUSCULAR | Status: DC | PRN
  Administered 2022-02-05: 15 mg via INTRAVENOUS

## 2022-02-05 MED ORDER — OXYCODONE HCL 5 MG PO TABS: 5.0000 mg | ORAL_TABLET | ORAL | 0 refills | Status: DC | PRN

## 2022-02-05 MED ORDER — LACTATED RINGERS IR SOLN
Status: DC | PRN
  Administered 2022-02-05: 3001 mL

## 2022-02-05 MED ORDER — BUPIVACAINE HCL (PF) 0.5 % IJ SOLN
INTRAMUSCULAR | Status: AC
  Filled 2022-02-05: qty 10

## 2022-02-05 MED ORDER — SUCCINYLCHOLINE CHLORIDE 200 MG/10ML IV SOSY
PREFILLED_SYRINGE | INTRAVENOUS | Status: DC | PRN
  Administered 2022-02-05: 100 mg via INTRAVENOUS

## 2022-02-05 MED ORDER — PHENYLEPHRINE HCL-NACL 20-0.9 MG/250ML-% IV SOLN
INTRAVENOUS | Status: DC | PRN
  Administered 2022-02-05: 30 ug/min via INTRAVENOUS

## 2022-02-05 MED ORDER — ATORVASTATIN CALCIUM 40 MG PO TABS: 40.0000 mg | ORAL_TABLET | ORAL | Status: DC

## 2022-02-05 MED ORDER — FESOTERODINE FUMARATE ER 8 MG PO TB24: 8.0000 mg | ORAL_TABLET | ORAL | Status: DC

## 2022-02-05 SURGICAL SUPPLY — 41 items
APL PRP STRL LF DISP 70% ISPRP (MISCELLANEOUS) ×1
BLADE FULL RADIUS 3.5 (BLADE) ×2 IMPLANT
BUR ACROMIONIZER 4.0 (BURR) ×2 IMPLANT
CANNULA SHAVER 8MMX76MM (CANNULA) ×2 IMPLANT
CHLORAPREP W/TINT 26 (MISCELLANEOUS) ×2 IMPLANT
COVER MAYO STAND REUSABLE (DRAPES) ×2 IMPLANT
COVER MAYO STAND STRL (DRAPES) ×1 IMPLANT
ELECT CAUTERY BLADE 6.4 (BLADE) ×2 IMPLANT
ELECT REM PT RETURN 9FT ADLT (ELECTROSURGICAL) ×2
ELECTRODE REM PT RTRN 9FT ADLT (ELECTROSURGICAL) ×1 IMPLANT
GAUZE SPONGE 4X4 12PLY STRL (GAUZE/BANDAGES/DRESSINGS) ×2 IMPLANT
GAUZE XEROFORM 1X8 LF (GAUZE/BANDAGES/DRESSINGS) ×2 IMPLANT
GLOVE SRG 8 PF TXTR STRL LF DI (GLOVE) ×1 IMPLANT
GLOVE SURG ENC MOIS LTX SZ7.5 (GLOVE) ×4 IMPLANT
GLOVE SURG ENC MOIS LTX SZ8 (GLOVE) ×4 IMPLANT
GLOVE SURG UNDER LTX SZ8 (GLOVE) ×2 IMPLANT
GLOVE SURG UNDER POLY LF SZ8 (GLOVE) ×2
GOWN STRL REUS W/ TWL LRG LVL3 (GOWN DISPOSABLE) ×1 IMPLANT
GOWN STRL REUS W/ TWL XL LVL3 (GOWN DISPOSABLE) ×1 IMPLANT
GOWN STRL REUS W/TWL LRG LVL3 (GOWN DISPOSABLE) ×2
GOWN STRL REUS W/TWL XL LVL3 (GOWN DISPOSABLE) ×2
GRASPER SUT 15 45D LOW PRO (SUTURE) IMPLANT
IV LACTATED RINGER IRRG 3000ML (IV SOLUTION) ×2
IV LR IRRIG 3000ML ARTHROMATIC (IV SOLUTION) ×2 IMPLANT
MANIFOLD NEPTUNE II (INSTRUMENTS) ×4 IMPLANT
MASK FACE SPIDER DISP (MASK) ×2 IMPLANT
MAT ABSORB  FLUID 56X50 GRAY (MISCELLANEOUS) ×1
MAT ABSORB FLUID 56X50 GRAY (MISCELLANEOUS) ×1 IMPLANT
PACK ARTHROSCOPY SHOULDER (MISCELLANEOUS) ×2 IMPLANT
PAD ABD DERMACEA PRESS 5X9 (GAUZE/BANDAGES/DRESSINGS) ×3 IMPLANT
SLING ARM LRG DEEP (SOFTGOODS) ×2 IMPLANT
SPONGE T-LAP 18X18 ~~LOC~~+RFID (SPONGE) ×2 IMPLANT
STRAP SAFETY 5IN WIDE (MISCELLANEOUS) ×2 IMPLANT
SUT ETHIBOND 0 MO6 C/R (SUTURE) ×2 IMPLANT
SUT VIC AB 2-0 CT1 27 (SUTURE) ×4
SUT VIC AB 2-0 CT1 TAPERPNT 27 (SUTURE) ×2 IMPLANT
TAPE MICROFOAM 4IN (TAPE) ×2 IMPLANT
TUBING CONNECTING 10 (TUBING) ×2 IMPLANT
TUBING INFLOW SET DBFLO PUMP (TUBING) ×2 IMPLANT
WAND WEREWOLF FLOW 90D (MISCELLANEOUS) ×2 IMPLANT
WATER STERILE IRR 500ML POUR (IV SOLUTION) ×2 IMPLANT

## 2022-02-05 NOTE — Anesthesia Postprocedure Evaluation (Signed)
Anesthesia Post Note  Patient: Gregory [name]  Procedure(s) Performed: SHOULDER ARTHROSCOPY WITH EXTENSIVE DEBRIDEMENT, DECOMPRESSION, (Left)  Patient location during evaluation: PACU Anesthesia Type: General Level of consciousness: awake and alert Pain management: pain level controlled Vital Signs Assessment: post-procedure vital signs reviewed and stable Respiratory status: spontaneous breathing, nonlabored ventilation, respiratory function stable and patient connected to nasal cannula oxygen Cardiovascular status: blood pressure returned to baseline and stable Postop Assessment: no apparent nausea or vomiting Anesthetic complications: no   No notable events documented.   Last Vitals:  Vitals:   02/05/22 1230 02/05/22 1245  BP: 112/63 (!) 99/47  Pulse: 63 65  Resp: 14 20  Temp: (!) 36.1 C (!) 36.1 C  SpO2: 95% 94%    Last Pain:  Vitals:   02/05/22 1245  TempSrc: Temporal  PainSc: 0-No pain                 Arita Miss

## 2022-02-05 NOTE — Discharge Instructions (Addendum)
Orthopedic discharge instructions: Keep dressing dry and intact.  May shower after dressing changed on post-op day #4 (Monday).  Cover staples with Band-Aids after drying off. Apply ice frequently to shoulder. Take ibuprofen 600-800 mg TID with meals for 7-10 days, then as necessary. Take oxycodone as prescribed when needed.  May supplement with ES Tylenol if necessary. Keep shoulder immobilizer on at all times except may remove for bathing purposes. Follow-up in 10-14 days or as scheduled.  AMBULATORY SURGERY  DISCHARGE INSTRUCTIONS   The drugs that you were given will stay in your system until tomorrow so for the next 24 hours you should not:  Drive an automobile Make any legal decisions Drink any alcoholic beverage   You may resume regular meals tomorrow.  Today it is better to start with liquids and gradually work up to solid foods.  You may eat anything you prefer, but it is better to start with liquids, then soup and crackers, and gradually work up to solid foods.   Please notify your doctor immediately if you have any unusual bleeding, trouble breathing, redness and pain at the surgery site, drainage, fever, or pain not relieved by medication.    Additional Instructions:        Please contact your physician with any problems or Same Day Surgery at [phone], Monday through Friday 6 am to 4 pm, or [place] at [place] [facility] number at [phone]. 

## 2022-02-05 NOTE — Anesthesia Procedure Notes (Signed)
Procedure Name: Intubation Date/Time: 02/05/2022 10:47 AM Performed by: Kerri Perches, CRNA Pre-anesthesia Checklist: Patient identified, Patient being monitored, Timeout performed, Emergency Drugs available and Suction available Patient Re-evaluated:Patient Re-evaluated prior to induction Oxygen Delivery Method: Circle system utilized Preoxygenation: Pre-oxygenation with 100% oxygen Induction Type: IV induction Ventilation: Mask ventilation without difficulty Laryngoscope Size: 3 and McGraph Grade View: Grade I Tube type: Oral Tube size: 7.0 mm Number of attempts: 1 Airway Equipment and Method: Stylet Placement Confirmation: ETT inserted through vocal cords under direct vision, positive ETCO2 and breath sounds checked- equal and bilateral Secured at: 23 cm Tube secured with: Tape Dental Injury: Teeth and Oropharynx as per pre-operative assessment

## 2022-02-05 NOTE — Transfer of Care (Addendum)
Immediate Anesthesia Transfer of Care Note  Patient: Gregory [name]  Procedure(s) Performed: SHOULDER ARTHROSCOPY WITH DEBRIDEMENT, DECOMPRESSION, BICEPS TENOLYSIS AND POSSIBLE ROTATOR CUFF REPAIR (Left)  Patient Location: PACU  Anesthesia Type:GA combined with regional for post-op pain  Level of Consciousness: awake  Airway & Oxygen Therapy: Patient Spontanous Breathing  Post-op Assessment: Report given to RN  Post vital signs: stable  Last Vitals:  Vitals Value Taken Time  BP    Temp    Pulse    Resp    SpO2      Last Pain:  Vitals:   02/05/22 0856  TempSrc: Temporal  PainSc: 2          Complications: No notable events documented.

## 2022-02-05 NOTE — Anesthesia Preprocedure Evaluation (Addendum)
Anesthesia Evaluation  Patient identified by MRN, date of birth, ID band Patient awake    Reviewed: Allergy & Precautions, NPO status , Patient's Chart, lab work & pertinent test results  History of Anesthesia Complications Negative for: history of anesthetic complications  Airway Mallampati: II  TM Distance: >3 FB Neck ROM: Full    Dental  (+) Chipped   Pulmonary sleep apnea , neg COPD, Patient abstained from smoking.Not current smoker,    Pulmonary exam normal breath sounds clear to auscultation       Cardiovascular Exercise Tolerance: Good METShypertension, Pt. on medications + CAD and + Past MI  (-) dysrhythmias  Rhythm:Regular Rate:Normal - Systolic murmurs ECHO:Left ventricle: The cavity size was normal. There was moderate asymmetric hypertrophy of the septum. Systolic function was normal. The estimated ejection fraction was in the range of 55% to 60%. Wall motion was normal; there were no regional wall motion abnormalities. Doppler parameters are consistent with abnormal left ventricular relaxation (grade 1 diastolic dysfunction). - Aorta: Aortic root dimension: 41 mm (ED). - Aortic root: The aortic root was mildly dilated. - Left atrium: The atrium was mildly dilated.  CATH:  2nd Diag lesion, 60 %stenosed.  2nd RPLB lesion, 100 %stenosed.  3rd RPLB lesion, 60 %stenosed.  Prox LAD lesion, 40 %stenosed.  Mid LAD lesion, 40 %stenosed.  1st Mrg lesion, 60 %stenosed.    Neuro/Psych negative neurological ROS  negative psych ROS   GI/Hepatic GERD  Controlled,(+)     (-) substance abuse  ,   Endo/Other  neg diabetes  Renal/GU negative Renal ROS     Musculoskeletal   Abdominal   Peds  Hematology   Anesthesia Other Findings Past Medical History: No date: Actinic keratosis 10/06/2017: Aortic root dilatation (HCC)     Comment:  a.) TTE 10/06/2017: Ao root 39mm. No date:  Arthritis 08/21/2009: Basal cell carcinoma     Comment:  Left neck. Excised: 10/03/2009, margins free. 10/06/2017: Coronary artery disease     Comment:  a.) LHC 10/06/2017: 60% D2, 100% 2nd RPLB, 60% 3rd RPLB,              40% pLAD, 40% mLAD, 60% OM1; Rx mgmt. 09/12/3006: Diastolic dysfunction     Comment:  a.) TTE 10/06/2017: EF 55-60%; G1DD. 08/23/2008: Dysplastic nevus     Comment:  Right vertex. Moderate to severe atypia, bordering on               early evolving MMis. 02/21/2010: Dysplastic nevus     Comment:  Right vertex. Moderate to severe atypia, bordering on               early evolving MMis. 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 WD SCC. EDC  Reproductive/Obstetrics                                                             Anesthesia Evaluation  Patient identified by MRN, date of birth, ID band Patient awake    Reviewed: Allergy & Precautions, H&P , NPO status , Patient's Chart,  lab work & pertinent test results, reviewed documented beta blocker date and time   History of Anesthesia Complications Negative for: history of anesthetic complications  Airway Mallampati: III  TM Distance: >3 FB Neck ROM: limited    Dental  (+) Poor Dentition, Chipped   Pulmonary neg pulmonary ROS, neg shortness of breath,    Pulmonary exam normal breath sounds clear to auscultation       Cardiovascular Exercise Tolerance: Good hypertension, Pt. on medications and Pt. 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 negative neurological ROS  negative psych ROS   GI/Hepatic negative GI ROS, Neg liver ROS, neg GERD  ,  Endo/Other  negative endocrine ROS  Renal/GU Renal diseasenegative Renal ROSstones Bladder dysfunction  Hx of bladder surgery negative genitourinary   Musculoskeletal negative musculoskeletal ROS (+)   Abdominal   Peds negative pediatric ROS (+)  Hematology negative hematology ROS (+)   Anesthesia Other Findings CA prostate  Reproductive/Obstetrics negative OB ROS                             Anesthesia Physical  Anesthesia Plan  ASA: III  Anesthesia Plan: General   Post-op Pain Management:    Induction: Intravenous  Airway Management Planned: Oral ETT and LMA  Additional Equipment:   Intra-op Plan:   Post-operative Plan: Extubation in OR  Informed Consent: I have reviewed the patients History and Physical, chart, labs and discussed the procedure including the risks, benefits and alternatives for the proposed anesthesia with the patient or authorized representative who has indicated his/her understanding and acceptance.   Dental advisory given  Plan Discussed with: CRNA and Surgeon  Anesthesia Plan Comments:        Anesthesia Quick Evaluation  Anesthesia  Physical Anesthesia Plan  ASA: 3  Anesthesia Plan: General   Post-op Pain Management: Ofirmev IV (intra-op)*   Induction: Intravenous  PONV Risk Score and Plan: 2 and Ondansetron, Dexamethasone, Midazolam and Treatment may vary due to age or medical condition  Airway Management Planned: Oral ETT  Additional Equipment: None  Intra-op Plan:   Post-operative Plan: Extubation in OR  Informed Consent: I have reviewed the patients History and Physical, chart, labs and discussed the procedure including the risks, benefits and alternatives for  the proposed anesthesia with the patient or authorized representative who has indicated his/her understanding and acceptance.     Dental advisory given  Plan Discussed with: CRNA and Surgeon  Anesthesia Plan Comments: (Discussed risks of anesthesia with patient, including PONV, sore throat, lip/dental/eye damage. Rare risks discussed as well, such as cardiorespiratory and neurological sequelae, and allergic reactions. Discussed the role of CRNA in patient's perioperative care. Patient understands. Discussed r/b/a of interscalene block, including elective nature. Risks discussed: - Rare: bleeding, infection, nerve damage - shortness of breath from hemidiaphragmatic paralysis - unilateral horner's syndrome - poor/non-working blocks - reactions and toxicity to local anesthetic Patient understands and agrees. )        Anesthesia Quick Evaluation

## 2022-02-05 NOTE — Anesthesia Procedure Notes (Signed)
Anesthesia Regional Block: Interscalene brachial plexus block   Pre-Anesthetic Checklist: , timeout performed,  Correct Patient, Correct Site, Correct Laterality,  Correct Procedure, Correct Position, site marked,  Risks and benefits discussed,  Surgical consent,  Pre-op evaluation,  At surgeon's request and post-op pain management  Laterality: Left  Prep: chloraprep       Needles:  Injection technique: Single-shot  Needle Type: Echogenic Needle     Needle Length: 4cm  Needle Gauge: 25     Additional Needles:   Procedures:,,,, ultrasound used (permanent image in chart),,    Narrative:  Injection made incrementally with aspirations every 5 mL.  Performed by: Personally  Anesthesiologist: Darrall Strey, MD  Additional Notes: Patient's chart reviewed and they were deemed appropriate candidate for procedure, at surgeon's request. Patient educated about risks, benefits, and alternatives of the block including but not limited to: temporary or permanent nerve damage, bleeding, infection, damage to surround tissues, pneumothorax, hemidiaphragmatic paralysis, unilateral Horner's syndrome, block failure, local anesthetic toxicity. Patient expressed understanding. A formal time-out was conducted consistent with institution rules.  Monitors were applied, and minimal sedation used (see nursing record). The site was prepped with skin prep and allowed to dry, and sterile gloves were used. A high frequency linear ultrasound probe with probe cover was utilized throughout. C5-7 nerve roots located and appeared anatomically normal, local anesthetic injected around them, and echogenic block needle trajectory was monitored throughout. Aspiration performed every 5ml. Lung and blood vessels were avoided. All injections were performed without resistance and free of blood and paresthesias. The patient tolerated the procedure well.  Injectate: 10ml exparel + 10ml 0.5% bupivacaine      

## 2022-02-05 NOTE — Op Note (Signed)
02/05/2022  11:54 AM  Patient:   Gregory [name]  Pre-Op Diagnosis:   Impingement/tendinopathy with possible rotator cuff tear and biceps tendon injury, left shoulder.  Post-Op Diagnosis:   Impingement/tendinopathy with biceps tendon injury and Type I SLAP tear, left shoulder.  Procedure:   Extensive arthroscopic debridement and arthroscopic subacromial decompression, left shoulder.  Anesthesia:   General endotracheal with interscalene block using Exparel placed preoperatively by the anesthesiologist.  Surgeon:   Pascal Lux, MD  Assistant:   Cameron Proud, PA-C   Findings:   As above. The biceps tendon had torn in the area of the proximal bicipital groove, leaving the intra-articular stump in place. There was a type I SLAP tear without frank detachment from the glenoid rim. The remainder of the labrum was in satisfactory condition. The rotator cuff demonstrated mild fraying of the articular insertional fibers of the supraspinatus tendon without compromise of the footprint. There also was very mild abrasion/fraying of the bursal side of the supraspinatus tendon involving less than 10% of the tendon thickness. The remainder of the rotator cuff was in satisfactory condition.  Complications:   None  Fluids:   5 cc  Estimated blood loss:   500 cc  Tourniquet time:   None  Drains:   None  Closure:   Staples      Brief clinical note:   The patient is a 81 year old male who injured his shoulder following an injury which he tripped and fell, striking the anterior aspect of his shoulder on a door jam. The patient's symptoms have progressed despite medications, activity modification, etc. The patient's history and examination are consistent with impingement/tendinopathy with a possible rotator cuff tear. His preoperative MRI scan demonstrated the presence of a tear of the long head of the biceps tendon, as well as some tendinitis, but no evidence for partial or full-thickness rotator  cuff tearing. The patient presents at this time for definitive management of these shoulder symptoms.  Procedure:   The patient underwent placement of an interscalene block using Exparel by the anesthesiologist in the preoperative holding area before being brought into the operating room and lain in the supine position. The patient then underwent general endotracheal intubation and anesthesia before being repositioned in the beach chair position using the beach chair positioner. The left shoulder and upper extremity were prepped with ChloraPrep solution before being draped sterilely. Preoperative antibiotics were administered. A timeout was performed to confirm the proper surgical site before the expected portal sites and incision site were injected with 0.5% Sensorcaine with epinephrine.   A posterior portal was created and the glenohumeral joint thoroughly inspected with the findings as described above. An anterior portal was created using an outside-in technique. The labrum and rotator cuff were further probed, again confirming the above-noted findings. The areas of labral fraying superiorly were debrided back to stable margins using the full-radius resector. In addition, areas of extensive synovitis in the anterior aspect of the shoulder also were debrided back to stable margins using the full-radius resector. The ArthroCare wand was inserted and used to release the biceps tendon stump from its labral anchor. The stump was then removed from the joint using a large pituitary rongeur. The ArthroCare wand also was used to debride excess areas of extensive synovitis in the anterior and superior aspects of the shoulder, as well as to obtain hemostasis and to "anneal" the labrum superiorly. The instruments were removed from the joint after suctioning the excess fluid.  The camera was repositioned through  the posterior portal into the subacromial space. A separate lateral portal was created using an outside-in  technique. The 3.5 mm full-radius resector was introduced and used to perform a subtotal bursectomy. The ArthroCare wand was then inserted and used to remove the periosteal tissue off the undersurface of the anterior third of the acromion as well as to recess the coracoacromial ligament from its attachment along the anterior and lateral margins of the acromion. The 4.0 mm acromionizing bur was introduced and used to complete the decompression by removing the undersurface of the anterior third of the acromion. The full radius resector was reintroduced to remove any residual bony debris before the ArthroCare wand was reintroduced to obtain hemostasis. Arthroscopic visualization of the bursal surface of the rotator cuff showed mild fraying involving the anterior insertional fibers of the supraspinatus tendon, but otherwise showed no evidence for any partial or full-thickness rotator cuff tears. The instruments were then removed from the subacromial space after suctioning the excess fluid.  The wound was copiously irrigated with sterile saline solution before the  portal sites were closed using staples. A sterile bulky dressing was applied to the shoulder before the arm was placed into a shoulder sling. The patient was then awakened, extubated, and returned to the recovery room in satisfactory condition after tolerating the procedure well.

## 2022-02-05 NOTE — H&P (Signed)
History of Present Illness: Gregory [name] is a 81 y.o. male who presents today for repeat evaluation of ongoing left shoulder pain and discussion of recent left shoulder MRI results. The patient suffered a fall on 12/12/2021, he reached out with his left arm to try to catch himself and landed directly on his left upper extremity. Since then the patient has had increased pain with attempted range of motion and when trying to lift with the left arm. The patient did undergo x-rays of the left shoulder which were negative for evidence of acute fracture, there is no evidence of dislocation or significant osteoarthritic changes. The patient was evaluated by orthopedics, given his exam findings there was concern for rotator cuff tear so a MRI scan of the left shoulder was obtained. The patient also had developed a Popeye deformity after his fall. The patient states that his range of motion has slightly improved since his last evaluation however he does still report increased discomfort when attempting motion above shoulder level or out to the side. He is taking Mobic and occasional Tylenol as needed for discomfort, pain score is a 3 out of 10 to the left upper extremity. The patient is active and does use his left arm for routine activities throughout the day. He denies any personal history of stroke, DVT, asthma or COPD. He does report a history of MI 4 years ago, patient states that he had some cardiac lab markers that were elevated but when he underwent further testing there is no structural damage.  Past Medical History:  Acoustic neuroma (CMS-HCC)   Allergic state   Bursitis of hip   Hypertension   Kidney stones   Melanoma (CMS-HCC)   Prostate cancer (CMS-HCC) (s/p surg 2003)   Thyroid nodule   Transitional cell carcinoma (CMS-HCC) (s/p surg 2012)   Past Surgical History:  PROSTATE SURGERY 2003   BRAIN TUMOR SURGERY 2005   SKIN CANCER 2011   CYSTOTOMY W/EXCISION BLADDER TUMOR 2012    CHOLECYSTECTOMY   Past Family History:  Stroke Mother   High blood pressure (Hypertension) Mother   Rheum arthritis Mother   Coronary Artery Disease (Blocked arteries around heart) Father   Myocardial Infarction (Heart attack) Father   Medications:  acetaminophen (TYLENOL) 500 MG tablet Take 500 mg by mouth as needed for Pain   amLODIPine (NORVASC) 10 MG tablet Take 1 tablet (10 mg total) by mouth once daily 90 tablet 3   aspirin 81 MG EC tablet Take 1 tablet by mouth   atenoloL (TENORMIN) 25 MG tablet Take 1 tablet (25 mg total) by mouth once daily 90 tablet 3   atorvastatin (LIPITOR) 40 MG tablet Take 1 tablet (40 mg total) by mouth once daily 90 tablet 3   fluticasone propionate (FLONASE) 50 mcg/actuation nasal spray INSTILL 2 SPRAYS INTO EACH NOSTRIL ONCE A DAY 12   hydroCHLOROthiazide (HYDRODIURIL) 25 MG tablet Take 1 tablet (25 mg total) by mouth once daily 90 tablet 3   ipratropium (ATROVENT) 0.06 % nasal spray Place 2 sprays into both nostrils 3 (three) times daily as needed for Rhinitis 15 mL 0   nitroGLYcerin (NITROSTAT) 0.4 MG SL tablet Place 1 tablet under the tongue once daily as needed for Chest pain Then every 5 minutes with max of 3 tablets or until chest pain dissolves   olmesartan (BENICAR) 40 MG tablet Take 1 tablet (40 mg total) by mouth once daily 90 tablet 3   TOVIAZ 8 mg ER tablet Take 1 tablet (8 mg total)  by mouth once daily 90 tablet 3   Allergies:  Iodine Anaphylaxis (Other Reaction: facial/throat swelling   SHELLFISH (Other Reaction: FACIAL/THROAT SWELLING)   Review of Systems:  A comprehensive 14 point ROS was performed, reviewed by me today, and the pertinent orthopaedic findings are documented in the HPI.  Physical Exam: BP 130/80  Ht 172.7 cm (5\' 8" )  Wt 92.5 kg (204 lb)  BMI 31.02 kg/m  General/Constitutional: The patient appears to be well-nourished, well-developed, and in no acute distress. Neuro/Psych: Normal mood and affect, oriented to person,  place and time. Eyes: Non-icteric. Pupils are equal, round, and reactive to light, and exhibit synchronous movement. ENT: Unremarkable. Lymphatic: No palpable adenopathy. Respiratory: Lungs clear to auscultation, Normal chest excursion, No wheezes and Non-labored breathing Cardiovascular: Regular rate and rhythm. No murmurs. and No edema, swelling or tenderness, except as noted in detailed exam. Integumentary: No impressive skin lesions present, except as noted in detailed exam. Musculoskeletal: Unremarkable, except as noted in detailed exam.  Left shoulder exam: Skin examination of the left upper extremity does reveal evidence of a Popeye deformity to the left arm. The patient does have full left wrist range of motion, full elbow range of motion without pain. Full strength with resisted supination and pronation of the left forearm in addition to left elbow. Distal biceps tendon is nontender to palpation, negative hook test to the left elbow. He is able to forward flex 95 degrees, abduction 90 degrees. With the arm at his side he does have full internal rotation, increased pain with external rotation. With the left arm abducted 90 degrees the patient can tolerate 90 degrees of external rotation, 70 degrees of internal rotation. The patient does have a positive drop arm test to the left upper extremity. Positive empty can test. Tenderness with palpaiton over the proximal biceps tendon. The patient is intact to light touch on the left upper extremity. Cap refills intact each individual digit. The patient has significant limitations with strength for resisted left shoulder abduction and both internal and external rotation with the left arm abducted 90 degrees.  Imaging: X-rays are detailed in the HPI above. These x-rays did not demonstrate any evidence of acute fracture or dislocation. No significant arthritic changes.  MRI OF THE LEFT SHOULDER WITHOUT CONTRAST:  1. Mild tendinosis of the supraspinatus  tendon.  2. Mild tendinosis of the subscapularis tendon.  3. Severe tendinosis of the intra-articular portion and proximal  extra-articular portion of the long head of the biceps tendon with a  high-grade partial-thickness tear.  4. Thickening of the inferior joint capsule as can be seen with  adhesive capsulitis.   Impression: 1. Rotator cuff tendonitis, left. 2. Biceps tendonitis on left.  Plan:  1. Treatment options were discussed today with the patient. 2. MRI scan of the left shoulder does reveal underlying rotator cuff tendinitis and high-grade partial-thickness tearing of the proximal bicep tendon. 3. I believe that his ongoing left shoulder pain is likely due to to continued irritation to the remaining fibers of the long head of the biceps, the patient does already have a Popeye's deformity indicating that a portion of the biceps is torn and retracted at this time. 4. Discussed both surgical and nonsurgical options with the patient at today's visit, he is quite frustrated by his continued limitations in left shoulder pain. 5. The patient would like to proceed with a left shoulder arthroscopy with debridement, decompression, biceps tenolysis and possible rotator cuff repair. Surgery will be scheduled with Dr. Roland Rack  in the future. 6. This document will serve as a surgical history and physical for the patient. He will follow-up per standard postop protocol.  The procedure was discussed with the patient, as were the potential risks (including bleeding, infection, nerve and/or blood vessel injury, persistent or recurrent pain, failure of the repair, progression of arthritis, need for further surgery, blood clots, strokes, heart attacks and/or arhythmias, pneumonia, etc.) and benefits. The patient states his understanding and wishes to proceed.   H&P reviewed and patient re-examined. No changes.

## 2022-02-06 ENCOUNTER — Encounter: Payer: Self-pay | Admitting: Surgery

## 2022-02-12 ENCOUNTER — Ambulatory Visit: Payer: Medicare HMO | Admitting: Dermatology

## 2022-02-16 DIAGNOSIS — Z9889 Other specified postprocedural states: Secondary | ICD-10-CM | POA: Diagnosis not present

## 2022-02-23 ENCOUNTER — Telehealth: Payer: Self-pay

## 2022-02-23 NOTE — Telephone Encounter (Signed)
Left message on voicemail to reschedule appointment.  ?

## 2022-02-24 ENCOUNTER — Ambulatory Visit: Payer: Medicare HMO | Admitting: Dermatology

## 2022-02-24 ENCOUNTER — Other Ambulatory Visit: Payer: Self-pay

## 2022-02-24 ENCOUNTER — Encounter: Payer: Self-pay | Admitting: Dermatology

## 2022-02-24 DIAGNOSIS — Z85828 Personal history of other malignant neoplasm of skin: Secondary | ICD-10-CM

## 2022-02-24 DIAGNOSIS — L578 Other skin changes due to chronic exposure to nonionizing radiation: Secondary | ICD-10-CM

## 2022-02-24 DIAGNOSIS — Z86018 Personal history of other benign neoplasm: Secondary | ICD-10-CM | POA: Diagnosis not present

## 2022-02-24 DIAGNOSIS — L820 Inflamed seborrheic keratosis: Secondary | ICD-10-CM | POA: Diagnosis not present

## 2022-02-24 DIAGNOSIS — L821 Other seborrheic keratosis: Secondary | ICD-10-CM

## 2022-02-24 NOTE — Patient Instructions (Signed)

## 2022-02-24 NOTE — Progress Notes (Signed)
? ?Follow-Up Visit ?  ?Subjective  ?Gregory [name] is a 81 y.o. male who presents for the following: Follow-up. ? ?Patient here for 6 month follow-up Aks. He has a couple of spots on the face he would like checked. Patient defers TBSE today due to shoulder surgery a couple weeks ago. He has a history of BCC, SCC, and mod to severe dysplastic nevus of the right vertex. ? ?The following portions of the chart were reviewed this encounter and updated as appropriate:  ?  ?  ? ?Review of Systems:  No other skin or systemic complaints except as noted in HPI or Assessment and Plan. ? ?Objective  ?Well appearing patient in no apparent distress; mood and affect are within normal limits. ? ?A focused examination was performed including face, scalp. Relevant physical exam findings are noted in the Assessment and Plan. ? ?R malar cheek x 1, L forehead x 1, L cheek x 2, L jaw x 1 (5) ?Erythematous keratotic papule  ? ? ? ?Assessment & Plan  ?Actinic Damage ?- chronic, secondary to cumulative UV radiation exposure/sun exposure over time ?- diffuse scaly erythematous macules with underlying dyspigmentation ?- Recommend daily broad spectrum sunscreen SPF 30+ to sun-exposed areas, reapply every 2 hours as needed.  ?- Recommend staying in the shade or wearing long sleeves, sun glasses (UVA+UVB protection) and wide brim hats (4-inch brim around the entire circumference of the hat). ?- Call for new or changing lesions. ? ?History of Basal Cell Carcinoma of the Skin ?- No evidence of recurrence today of the L neck ?- Recommend regular full body skin exams ?- Recommend daily broad spectrum sunscreen SPF 30+ to sun-exposed areas, reapply every 2 hours as needed.  ?- Call if any new or changing lesions are noted between office visits ? ?History of Squamous Cell Carcinoma of the Skin ?- No evidence of recurrence today of the R post ear ?- Recommend regular full body skin exams ?- Recommend daily broad spectrum sunscreen SPF 30+ to  sun-exposed areas, reapply every 2 hours as needed.  ?- Call if any new or changing lesions are noted between office visits ? ?History of Dysplastic Nevi ?- No evidence of recurrence today of the R vertex ?- Recommend regular full body skin exams ?- Recommend daily broad spectrum sunscreen SPF 30+ to sun-exposed areas, reapply every 2 hours as needed.  ?- Call if any new or changing lesions are noted between office visits ? ?Seborrheic Keratoses ?- Stuck-on, waxy, tan-brown papules and/or plaques  ?- Benign-appearing ?- Discussed benign etiology and prognosis. ?- Observe ?- Call for any changes ? ? ?Inflamed seborrheic keratosis (5) ?R malar cheek x 1, L forehead x 1, L cheek x 2, L jaw x 1 ? ?vs Hypertrophic AKs ? ?Destruction of lesion - R malar cheek x 1, L forehead x 1, L cheek x 2, L jaw x 1 ? ?Destruction method: cryotherapy   ?Informed consent: discussed and consent obtained   ?Lesion destroyed using liquid nitrogen: Yes   ?Region frozen until ice ball extended beyond lesion: Yes   ?Outcome: patient tolerated procedure well with no complications   ?Post-procedure details: wound care instructions given   ?Additional details:  Prior to procedure, discussed risks of blister formation, small wound, skin dyspigmentation, or rare scar following cryotherapy. Recommend Vaseline ointment to treated areas while healing. ? ? ? ?Return in about 6 months (around 08/27/2022) for TBSE, Hx SCC, Hx BCC with Dr Laurence Ferrari. ? ?I, Jamesetta Orleans, CMA, am acting  as scribe for Brendolyn Patty, MD . ? ?Documentation: I have reviewed the above documentation for accuracy and completeness, and I agree with the above. ? ?Brendolyn Patty MD  ? ?

## 2022-02-26 DIAGNOSIS — Z9889 Other specified postprocedural states: Secondary | ICD-10-CM | POA: Diagnosis not present

## 2022-03-06 DIAGNOSIS — Z9889 Other specified postprocedural states: Secondary | ICD-10-CM | POA: Diagnosis not present

## 2022-03-09 DIAGNOSIS — H401111 Primary open-angle glaucoma, right eye, mild stage: Secondary | ICD-10-CM | POA: Diagnosis not present

## 2022-03-13 DIAGNOSIS — Z9889 Other specified postprocedural states: Secondary | ICD-10-CM | POA: Diagnosis not present

## 2022-03-23 DIAGNOSIS — Z9889 Other specified postprocedural states: Secondary | ICD-10-CM | POA: Diagnosis not present

## 2022-05-12 ENCOUNTER — Encounter: Payer: Self-pay | Admitting: Urology

## 2022-05-12 ENCOUNTER — Ambulatory Visit: Payer: Medicare HMO | Admitting: Urology

## 2022-05-12 VITALS — BP 133/71 | HR 66 | Ht 68.0 in | Wt 204.0 lb

## 2022-05-12 DIAGNOSIS — Z8546 Personal history of malignant neoplasm of prostate: Secondary | ICD-10-CM

## 2022-05-12 DIAGNOSIS — Z87442 Personal history of urinary calculi: Secondary | ICD-10-CM

## 2022-05-12 DIAGNOSIS — N3281 Overactive bladder: Secondary | ICD-10-CM

## 2022-05-12 LAB — URINALYSIS, COMPLETE
Bilirubin, UA: NEGATIVE
Glucose, UA: NEGATIVE
Leukocytes,UA: NEGATIVE
Nitrite, UA: NEGATIVE
RBC, UA: NEGATIVE
Specific Gravity, UA: >1.03 — ABNORMAL HIGH (ref 1.005–1.030)
Urobilinogen, Ur: 0.2 mg/dL (ref 0.2–1.0)
pH, UA: 6 (ref 5.0–7.5)

## 2022-05-12 LAB — MICROSCOPIC EXAMINATION: Bacteria, UA: NONE SEEN

## 2022-05-12 LAB — BLADDER SCAN AMB NON-IMAGING: Scan Result: 1

## 2022-05-12 NOTE — Progress Notes (Signed)
05/12/2022 1:39 PM   Gregory [name] [birth year] [id number]  Referring provider:  Tracie Harrier, MD [address] [name] [place],  [place] [address] No chief complaint on file.     HPI: Gregory [name] is a 81 y.o.male with a personal history of kidney stones, benign bladder growth, prostate cancer, and SUI/urinary urgency,  who presents today for an annual follow-up with IPSS, UA, and PVR.   History of kidney stones stone  Last KUB 04/2017 negative for stones  s/p L URS 02/2016 for 4 mm left midureteral stone, 3 mm nonobstructing L stone  Pre2017- 6 other previous remote stone episodes which has not required surgical intervention Stone analysis 97% calcium oxalate, 3% calcium phosphate No flank pain or stone episodes over the past year  History of benign bladder growth   TURBT in 2012, PUNLMP Previously followed by cystoscopy annually, last at time of URS (2017) Denies any dysuria or gross hematuria   History of prostate cancer S/p open radical prostatectomy 2013 PSA 0.01 on 07/2021, stable  SUI/urinary urgency  He is emptying adequately with PVR of 1 mL  IPSS below  He is still on Toviaz, tried a different medication earlier this year and felt that it worked "too" well and he had difficulty voiding.  He thinks Lisbeth Ply is the best I can tell he has urgency frequency symptoms.   IPSS     Row Name 05/12/22 1000         International Prostate Symptom Score   How often have you had the sensation of not emptying your bladder? Less than half the time     How often have you had to urinate less than every two hours? About half the time     How often have you found you stopped and started again several times when you urinated? Less than half the time     How often have you found it difficult to postpone urination? Not at All     How often have you had a weak urinary stream? Not at All     How often have you had to strain to start urination? Not  at All     How many times did you typically get up at night to urinate? 3 Times     Total IPSS Score 10       Quality of Life due to urinary symptoms   If you were to spend the rest of your life with your urinary condition just the way it is now how would you feel about that? Mostly Satisfied              Score:  1-7 Mild 8-19 Moderate 20-35 Severe   PMH: Past Medical History:  Diagnosis Date   Actinic keratosis    Aortic root dilatation ([place]) 10/06/2017   a.) TTE 10/06/2017: Ao root 27m.   Arthritis    Basal cell carcinoma 08/21/2009   Left neck. Excised: 10/03/2009, margins free.   Coronary artery disease 10/06/2017   a.) LHC 10/06/2017: 60% D2, 100% 2nd RPLB, 60% 3rd RPLB, 40% pLAD, 40% mLAD, 60% OM1; Rx mgmt.   Diastolic dysfunction 194/50/3888  a.) TTE 10/06/2017: EF 55-60%; G1DD.   Dysplastic nevus 08/23/2008   Right vertex. Moderate to severe atypia, bordering on early evolving MMis.   Dysplastic nevus 02/21/2010   Right vertex. Moderate to severe atypia, bordering on early evolving MMis.   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 WD SCC. [facility]    Surgical History: Past Surgical History:  Procedure  Laterality Date   BLADDER SURGERY  12/21/2010   Dr. Madelin Headings, [place] Left 02/2004   Procedure: CRANIOTOMY FOR ACOUSTIC NEUROMA REMOVAL; Location: [facility]   CRANIOTOMY FOR REPAIR DURAL / CSF LEAK N/A 06/13/2004   Procedure: SECONDARY SUBOCCIPITAL CRANIOTOMY WITH CLOSURE OF INTERNAL AUDITORY CANAL, CLOSURE OF DURA, AND PLACEMENT OF LUMBAR DRAIN; Location: [facility]   CYSTOSCOPY WITH RETROGRADE URETHROGRAM  03/16/2016   Procedure: CYSTOSCOPY WITH RETROGRADE URETHROGRAM;  Surgeon: Hollice Espy, MD;  Location: [facility];  Service: Urology;;   CYSTOSCOPY WITH STENT PLACEMENT Left 02/23/2016   Procedure: CYSTOSCOPY WITH STENT PLACEMENT;  Surgeon: Dereck Leep, MD;  Location: [facility];  Service: Urology;  Laterality: Left;   CYSTOSCOPY WITH STENT PLACEMENT Left 03/16/2016   Procedure: CYSTOSCOPY WITH STENT PLACEMENT/EXCHANGE;  Surgeon: Hollice Espy, MD;  Location: [facility];  Service: Urology;  Laterality: Left;   LEFT HEART CATH AND CORONARY ANGIOGRAPHY N/A 10/06/2017   Procedure: LEFT HEART CATH AND CORONARY ANGIOGRAPHY;  Surgeon: Minna Merritts, MD;  Location: [place] [facility];  Service: Cardiovascular;  Laterality: N/A;   MELANOMA EXCISION     PROSTATECTOMY  12/21/2001   Dr. Madelin Headings, [place] ARTHROSCOPY WITH SUBACROMIAL DECOMPRESSION, ROTATOR CUFF REPAIR AND BICEP TENDON REPAIR Left 02/05/2022   Procedure: SHOULDER ARTHROSCOPY WITH EXTENSIVE DEBRIDEMENT, DECOMPRESSION,;  Surgeon: Corky Mull, MD;  Location: [facility];  Service: Orthopedics;  Laterality: Left;   STONE EXTRACTION WITH BASKET  03/16/2016   Procedure: STONE EXTRACTION WITH BASKET;  Surgeon: Hollice Espy, MD;  Location: [facility];  Service: Urology;;    Home Medications:  Allergies as of 05/12/2022       Reactions   Iodine Anaphylaxis   Shellfish Allergy Anaphylaxis        Medication List        Accurate as of May 12, 2022  1:39 PM. If you have any questions, ask your  nurse or doctor.          amLODipine 10 MG tablet Commonly known as: NORVASC Take 1 tablet (10 mg total) by mouth every morning.   aspirin EC 81 MG tablet Take 1 tablet (81 mg total) by mouth daily.   atenolol 25 MG tablet Commonly known as: TENORMIN Take 1 tablet (25 mg total) by mouth every morning.   atorvastatin 40 MG tablet Commonly known as: LIPITOR Take 1 tablet (40 mg total) by mouth every morning.   fesoterodine 8 MG Tb24 tablet Commonly known as: TOVIAZ Take 1 tablet (8 mg total) by mouth every morning.   hydrochlorothiazide 25 MG  tablet Commonly known as: HYDRODIURIL Take 1 tablet (25 mg total) by mouth 3 (three) times a week.   nitroGLYCERIN 0.4 MG SL tablet Commonly known as: NITROSTAT Place 1 tablet (0.4 mg total) under the tongue every 5 (five) minutes as needed for chest pain (hold for blood pressure less than 90).   olmesartan 40 MG tablet Commonly known as: BENICAR Take 1 tablet (40 mg total) by mouth every morning.   oxyCODONE 5 MG immediate release tablet Commonly known as: Roxicodone Take 1-2 tablets (5-10 mg total) by mouth every 4 (four) hours as needed for moderate pain or severe pain.        Allergies:  Allergies  Allergen Reactions   Iodine Anaphylaxis   Shellfish Allergy Anaphylaxis    Family History: Family History  Problem Relation Age of Onset   CAD Father    Polycystic kidney disease Father    Diabetes Mellitus II Neg Hx    Hypertension Neg Hx     Social History:  reports that he has never smoked. He has never used smokeless tobacco. He reports that he does not drink alcohol and does not use drugs.   Physical Exam: BP 133/71   Pulse 66   Ht '5\' 8"'$  (1.727 m)   Wt 204 lb (92.5 kg)   BMI 31.02 kg/m   Constitutional:  Alert and oriented, No acute distress. HEENT: [place] AT, moist mucus membranes.  Trachea midline, no masses. Cardiovascular: No clubbing, cyanosis, or edema. Respiratory: Normal respiratory effort, no  increased work of breathing. Skin: No rashes, bruises or suspicious lesions. Neurologic: Grossly intact, no focal deficits, moving all 4 extremities. Psychiatric: Normal mood and affect.  Laboratory Data:  Lab Results  Component Value Date   CREATININE 1.24 10/07/2017   Lab Results  Component Value Date   HGBA1C 5.5 06/05/2016    Urinalysis Unremarkable   Pertinent Imaging: Results for orders placed or performed in visit on 05/12/22  Bladder Scan (Post Void Residual) in office  Result Value Ref Range   Scan Result 1      Assessment & Plan:   History of prostate cancer  - NED, PSA remains undetectable  - Recommend continued annual PSA testing   2. History of nephrolithiasis - Clinically asymptomatic, will hold off on imaging unless he can become symptomatic or develops another infection which may be indication that stone is the nidus - UA is unremarkable today   3. OAB  - Emptying well today, symptomatically controlled on Toviaz '8mg'$ .  - Continue Toviaz  - Bladder scan (PVR) in office   Follow-up in 1 year with IPSS/PVR/possible PSA depending on if not already done by PCP  Conley Rolls as a scribe for Hollice Espy, MD.,have documented all relevant documentation on the behalf of Hollice Espy, MD,as directed by  Hollice Espy, MD while in the presence of Hollice Espy, [place] [address], [place], [place] [address] [phone]

## 2022-05-19 ENCOUNTER — Emergency Department
Admission: EM | Admit: 2022-05-19 | Discharge: 2022-05-19 | Disposition: A | Discharge status: Home / Self Care | Payer: Medicare HMO | Attending: Emergency Medicine | Admitting: Emergency Medicine

## 2022-05-19 ENCOUNTER — Emergency Department: Payer: Medicare HMO

## 2022-05-19 ENCOUNTER — Other Ambulatory Visit: Payer: Self-pay

## 2022-05-19 DIAGNOSIS — K5792 Diverticulitis of intestine, part unspecified, without perforation or abscess without bleeding: Secondary | ICD-10-CM

## 2022-05-19 DIAGNOSIS — K429 Umbilical hernia without obstruction or gangrene: Secondary | ICD-10-CM | POA: Diagnosis not present

## 2022-05-19 DIAGNOSIS — N200 Calculus of kidney: Secondary | ICD-10-CM | POA: Diagnosis not present

## 2022-05-19 DIAGNOSIS — N281 Cyst of kidney, acquired: Secondary | ICD-10-CM | POA: Diagnosis not present

## 2022-05-19 DIAGNOSIS — R1032 Left lower quadrant pain: Secondary | ICD-10-CM | POA: Diagnosis not present

## 2022-05-19 DIAGNOSIS — K5732 Diverticulitis of large intestine without perforation or abscess without bleeding: Secondary | ICD-10-CM | POA: Insufficient documentation

## 2022-05-19 DIAGNOSIS — K5730 Diverticulosis of large intestine without perforation or abscess without bleeding: Secondary | ICD-10-CM | POA: Diagnosis not present

## 2022-05-19 LAB — COMPREHENSIVE METABOLIC PANEL
ALT: 25 U/L (ref 0–44)
AST: 21 U/L (ref 15–41)
Albumin: 4.1 g/dL (ref 3.5–5.0)
Alkaline Phosphatase: 72 U/L (ref 38–126)
Anion gap: 9 (ref 5–15)
BUN: 21 mg/dL (ref 8–23)
CO2: 24 mmol/L (ref 22–32)
Calcium: 9.1 mg/dL (ref 8.9–10.3)
Chloride: 106 mmol/L (ref 98–111)
Creatinine, Ser: 1.09 mg/dL (ref 0.61–1.24)
GFR, Estimated: >60 mL/min (ref >60)
Glucose, Bld: 106 mg/dL — ABNORMAL HIGH (ref 70–99)
Potassium: 3.5 mmol/L (ref 3.5–5.1)
Sodium: 139 mmol/L (ref 135–145)
Total Bilirubin: 0.8 mg/dL (ref 0.3–1.2)
Total Protein: 7.5 g/dL (ref 6.5–8.1)

## 2022-05-19 LAB — CBC
HCT: 49.1 % (ref 39.0–52.0)
Hemoglobin: 16.2 g/dL (ref 13.0–17.0)
MCH: 29.9 pg (ref 26.0–34.0)
MCHC: 33 g/dL (ref 30.0–36.0)
MCV: 90.6 fL (ref 80.0–100.0)
Platelets: 261 10*3/uL (ref 150–400)
RBC: 5.42 MIL/uL (ref 4.22–5.81)
RDW: 14.3 % (ref 11.5–15.5)
WBC: 13.2 10*3/uL — ABNORMAL HIGH (ref 4.0–10.5)
nRBC: 0 % (ref 0.0–0.2)

## 2022-05-19 LAB — LIPASE, BLOOD: Lipase: 33 U/L (ref 11–51)

## 2022-05-19 MED ORDER — SODIUM CHLORIDE 0.9 % IV BOLUS: 1000.0000 mL | Freq: Once | INTRAVENOUS | Status: DC

## 2022-05-19 MED ORDER — AMOXICILLIN-POT CLAVULANATE 875-125 MG PO TABS: 1.0000 | ORAL_TABLET | Freq: Two times a day (BID) | ORAL | 0 refills | Status: DC

## 2022-05-19 NOTE — ED Notes (Signed)
MD at the bedside to discuss plan of care. IV fluids d/c.

## 2022-05-19 NOTE — ED Triage Notes (Signed)
Pt here with LLQ abd pain x1 day. Pt denies N/V/D. Pt states pain is local and does not radiate.

## 2022-05-19 NOTE — ED Notes (Signed)
Pt to CT

## 2022-05-19 NOTE — ED Provider Notes (Signed)
   [facility] Provider Note    Event Date/Time   First MD Initiated Contact with Patient 05/19/22 1120     (approximate)  History   Chief Complaint: Abdominal Pain  HPI  Gregory [name] is a 81 y.o. male who presents to the emergency department for left lower quadrant abdominal pain.  According to the patient he has been experiencing left lower quadrant abdominal pain over the past several days.  Denies any nausea vomiting or diarrhea.  Patient does state a history of prior diverticulitis which this feels similar.  No urinary symptoms.  No black or bloody stool.  Physical Exam   Triage Vital Signs: ED Triage Vitals [05/19/22 1020]  Enc Vitals Group     BP 120/65     Pulse Rate 69     Resp 16     Temp 98.1 F (36.7 C)     Temp Source Oral     SpO2 95 %     Weight 204 lb (92.5 kg)     Height '5\' 8"'$  (1.727 m)     Head Circumference      Peak Flow      Pain Score 4     Pain Loc      Pain Edu?      Excl. in [place]?     Most recent vital signs: Vitals:   05/19/22 1020  BP: 120/65  Pulse: 69  Resp: 16  Temp: 98.1 F (36.7 C)  SpO2: 95%    General: Awake, no distress.  CV:  Good peripheral perfusion.  Regular rate and rhythm  Resp:  Normal effort.  Equal breath sounds bilaterally.  Abd:  No distention.  Soft, mild to moderate left lower quadrant tenderness no rebound or guarding.  Benign abdomen otherwise    ED Results / Procedures / Treatments    RADIOLOGY  I have interpreted the CT images no obvious bowel obstruction seen on my evaluation. CT scan read as acute uncomplicated diverticulitis   MEDICATIONS ORDERED IN ED: Medications  sodium chloride 0.9 % bolus 1,000 mL (has no administration in time range)     IMPRESSION / MDM / ASSESSMENT AND PLAN / ED COURSE  I reviewed the triage vital signs and the nursing notes.  Patient's presentation is most consistent with acute presentation with potential threat to life or bodily  function.  Patient presents emergency department for several days of left lower quadrant abdominal pain.  Moderate tenderness to palpation.  Differential would include colitis, diverticulitis, UTI pyelonephritis, ureterolithiasis.  We will check labs and obtain CT imaging to further evaluate.  Patient's CT scan shows acute uncomplicated diverticulitis.  Patient's lab work shows mild leukocytosis otherwise reassuring/normal chemistry and negative lipase.  We will place the patient on Augmentin twice daily for the next 10 days have the patient follow-up with his doctor.  Patient agreeable to plan of care.  Discussed return precautions.  FINAL CLINICAL IMPRESSION(S) / ED DIAGNOSES   Left lower quadrant abdominal pain Uncomplicated diverticulitis  Rx / DC Orders   Augmentin  Note:  This document was prepared using Dragon voice recognition software and may include unintentional dictation errors.   Harvest Dark, MD 05/19/22 1202

## 2022-06-01 ENCOUNTER — Emergency Department: Payer: Medicare HMO

## 2022-06-01 ENCOUNTER — Other Ambulatory Visit: Payer: Self-pay

## 2022-06-01 ENCOUNTER — Encounter: Payer: Self-pay | Admitting: Emergency Medicine

## 2022-06-01 ENCOUNTER — Emergency Department
Admission: EM | Admit: 2022-06-01 | Discharge: 2022-06-01 | Disposition: A | Discharge status: Home / Self Care | Payer: Medicare HMO | Attending: Emergency Medicine | Admitting: Emergency Medicine

## 2022-06-01 DIAGNOSIS — Z85828 Personal history of other malignant neoplasm of skin: Secondary | ICD-10-CM | POA: Insufficient documentation

## 2022-06-01 DIAGNOSIS — M25552 Pain in left hip: Secondary | ICD-10-CM | POA: Insufficient documentation

## 2022-06-01 DIAGNOSIS — M533 Sacrococcygeal disorders, not elsewhere classified: Secondary | ICD-10-CM | POA: Diagnosis not present

## 2022-06-01 DIAGNOSIS — I10 Essential (primary) hypertension: Secondary | ICD-10-CM | POA: Insufficient documentation

## 2022-06-01 DIAGNOSIS — M1612 Unilateral primary osteoarthritis, left hip: Secondary | ICD-10-CM | POA: Diagnosis not present

## 2022-06-01 DIAGNOSIS — Z8546 Personal history of malignant neoplasm of prostate: Secondary | ICD-10-CM | POA: Diagnosis not present

## 2022-06-01 DIAGNOSIS — I2510 Atherosclerotic heart disease of native coronary artery without angina pectoris: Secondary | ICD-10-CM | POA: Insufficient documentation

## 2022-06-01 MED ORDER — TRAMADOL HCL 50 MG PO TABS: 50.0000 mg | ORAL_TABLET | Freq: Four times a day (QID) | ORAL | 0 refills | Status: DC | PRN

## 2022-06-01 MED ORDER — PREDNISONE 10 MG (21) PO TBPK: ORAL_TABLET | ORAL | 0 refills | Status: DC

## 2022-06-01 NOTE — ED Provider Notes (Signed)
[facility] Provider Note    Event Date/Time   First MD Initiated Contact with Patient 06/01/22 1242     (approximate)   History   Hip Pain   HPI  Gregory [name] is a 81 y.o. male with history as listed below presenting to the emergency department for treatment and evaluation of nontraumatic left hip pain.  Pain has been progressively worsening over the past 2 weeks.  Primary care has referred him to orthopedics but they are unable to see him until the end of the week.  Pain increases with ambulation.   Past Medical History:  Diagnosis Date   Actinic keratosis    Aortic root dilatation ([place]) 10/06/2017   a.) TTE 10/06/2017: Ao root 2m.   Arthritis    Basal cell carcinoma 08/21/2009   Left neck. Excised: 10/03/2009, margins free.   Coronary artery disease 10/06/2017   a.) LHC 10/06/2017: 60% D2, 100% 2nd RPLB, 60% 3rd RPLB, 40% pLAD, 40% mLAD, 60% OM1; Rx mgmt.   Diastolic dysfunction 135/59/7416  a.) TTE 10/06/2017: EF 55-60%; G1DD.   Dysplastic nevus 08/23/2008   Right vertex. Moderate to severe atypia, bordering on early evolving MMis.   Dysplastic nevus 02/21/2010   Right vertex. Moderate to severe atypia, bordering on early evolving MMis.   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    Physical Exam   Triage Vital Signs: ED Triage Vitals [06/01/22 1229]  Enc Vitals Group     BP      Pulse      Resp      Temp      Temp src      SpO2      Weight 203 lb 14.8 oz (92.5 kg)     Height '5\' 8"'$  (1.727 m)     Head Circumference      Peak Flow      Pain Score 0     Pain Loc      Pain Edu?      Excl. in [place]     Most recent vital signs: Vitals:   06/01/22 1316  BP: 135/72  Pulse: 70  Resp: 17  Temp: 97.9 F (36.6 C)  SpO2: 96%    General: Awake, no distress.  CV:  Good peripheral perfusion.  Resp:  Normal effort.  Abd:  No distention.  Other:  No pain in the right hip with passive range of motion while laying supine on bed.   ED Results / Procedures / Treatments   Labs (all labs ordered are listed, but only abnormal results are displayed) Labs Reviewed - No data  to display   EKG  Not indicated   RADIOLOGY  X-ray images of the left hip is negative for acute findings.  I have independently reviewed and interpreted imaging as well as reviewed report from radiology.  PROCEDURES:  Critical Care performed: No  Procedures   MEDICATIONS ORDERED IN ED:  Medications - No data to display   IMPRESSION / MDM / [place] / ED COURSE   I reviewed the triage vital signs and the nursing notes.  Differential diagnosis includes, but is not limited to: Osteoarthritis left hip, stress fracture left hip, hip strain  Patient's presentation is most consistent with acute complicated illness / injury requiring diagnostic workup.  81 year old male presenting to the emergency department for treatment and evaluation of acute on chronic left hip pain.  See HPI for further details.  Patient states that prior to onset of pain, he had been walking more than usual then 2 days later there pain started.  Pain is focal and does not  radiate into the leg.  X-ray is normal.  Will perform CT to ensure there is no stress fracture since the pain got worse after ambulating for longer period of time than usual.  CT of the left hip shows no acute findings.  He does have mild to moderate osteoarthritis but otherwise negative.  Results discussed with the patient.  Patient feels that this is "nerve related because it goes from 0-10 when standing."  Plan will be to trial a tapered dose of prednisone and tramadol.  He is to keep his appointment as scheduled on Friday with orthopedics.      FINAL CLINICAL IMPRESSION(S) / ED DIAGNOSES   Final diagnoses:  Pain of left hip     Rx / DC Orders   ED Discharge Orders          Ordered    predniSONE (STERAPRED UNI-PAK 21 TAB) 10 MG (21) TBPK tablet        06/01/22 1530    traMADol (ULTRAM) 50 MG tablet  Every 6 hours PRN        06/01/22 1530             Note:  This document was prepared using Dragon voice recognition software and may include unintentional dictation errors.   Victorino Dike, FNP 06/01/22 [id number]    Duffy Bruce, MD 06/01/22 Pauline Aus

## 2022-06-01 NOTE — ED Triage Notes (Signed)
L hip pain x 2 weeks. Denies injury.

## 2022-06-02 DIAGNOSIS — Z85820 Personal history of malignant melanoma of skin: Secondary | ICD-10-CM | POA: Diagnosis not present

## 2022-06-02 DIAGNOSIS — D333 Benign neoplasm of cranial nerves: Secondary | ICD-10-CM | POA: Diagnosis not present

## 2022-06-02 DIAGNOSIS — C61 Malignant neoplasm of prostate: Secondary | ICD-10-CM | POA: Diagnosis not present

## 2022-06-02 DIAGNOSIS — I10 Essential (primary) hypertension: Secondary | ICD-10-CM | POA: Diagnosis not present

## 2022-06-02 DIAGNOSIS — R7309 Other abnormal glucose: Secondary | ICD-10-CM | POA: Diagnosis not present

## 2022-06-02 DIAGNOSIS — N200 Calculus of kidney: Secondary | ICD-10-CM | POA: Diagnosis not present

## 2022-06-02 DIAGNOSIS — N62 Hypertrophy of breast: Secondary | ICD-10-CM | POA: Diagnosis not present

## 2022-06-02 DIAGNOSIS — I2510 Atherosclerotic heart disease of native coronary artery without angina pectoris: Secondary | ICD-10-CM | POA: Diagnosis not present

## 2022-06-05 DIAGNOSIS — M7062 Trochanteric bursitis, left hip: Secondary | ICD-10-CM | POA: Diagnosis not present

## 2022-06-05 DIAGNOSIS — M1612 Unilateral primary osteoarthritis, left hip: Secondary | ICD-10-CM | POA: Diagnosis not present

## 2022-06-05 DIAGNOSIS — M25552 Pain in left hip: Secondary | ICD-10-CM | POA: Diagnosis not present

## 2022-06-09 DIAGNOSIS — Z6831 Body mass index (BMI) 31.0-31.9, adult: Secondary | ICD-10-CM | POA: Diagnosis not present

## 2022-06-09 DIAGNOSIS — Z1389 Encounter for screening for other disorder: Secondary | ICD-10-CM | POA: Diagnosis not present

## 2022-06-09 DIAGNOSIS — Z85820 Personal history of malignant melanoma of skin: Secondary | ICD-10-CM | POA: Diagnosis not present

## 2022-06-09 DIAGNOSIS — I2510 Atherosclerotic heart disease of native coronary artery without angina pectoris: Secondary | ICD-10-CM | POA: Diagnosis not present

## 2022-06-09 DIAGNOSIS — C61 Malignant neoplasm of prostate: Secondary | ICD-10-CM | POA: Diagnosis not present

## 2022-06-09 DIAGNOSIS — Z0000 Encounter for general adult medical examination without abnormal findings: Secondary | ICD-10-CM | POA: Diagnosis not present

## 2022-06-09 DIAGNOSIS — R739 Hyperglycemia, unspecified: Secondary | ICD-10-CM | POA: Diagnosis not present

## 2022-06-09 DIAGNOSIS — M25552 Pain in left hip: Secondary | ICD-10-CM | POA: Diagnosis not present

## 2022-07-02 ENCOUNTER — Emergency Department
Admission: EM | Admit: 2022-07-02 | Discharge: 2022-07-02 | Disposition: A | Discharge status: Home / Self Care | Payer: Medicare HMO | Attending: Emergency Medicine | Admitting: Emergency Medicine

## 2022-07-02 ENCOUNTER — Other Ambulatory Visit: Payer: Self-pay

## 2022-07-02 ENCOUNTER — Emergency Department: Payer: Medicare HMO

## 2022-07-02 ENCOUNTER — Encounter: Payer: Self-pay | Admitting: Emergency Medicine

## 2022-07-02 DIAGNOSIS — N200 Calculus of kidney: Secondary | ICD-10-CM | POA: Diagnosis not present

## 2022-07-02 DIAGNOSIS — N3091 Cystitis, unspecified with hematuria: Secondary | ICD-10-CM | POA: Diagnosis not present

## 2022-07-02 DIAGNOSIS — N2889 Other specified disorders of kidney and ureter: Secondary | ICD-10-CM | POA: Insufficient documentation

## 2022-07-02 DIAGNOSIS — Z8546 Personal history of malignant neoplasm of prostate: Secondary | ICD-10-CM | POA: Insufficient documentation

## 2022-07-02 DIAGNOSIS — N2881 Hypertrophy of kidney: Secondary | ICD-10-CM | POA: Diagnosis not present

## 2022-07-02 DIAGNOSIS — N281 Cyst of kidney, acquired: Secondary | ICD-10-CM | POA: Diagnosis not present

## 2022-07-02 DIAGNOSIS — N3090 Cystitis, unspecified without hematuria: Secondary | ICD-10-CM

## 2022-07-02 DIAGNOSIS — R319 Hematuria, unspecified: Secondary | ICD-10-CM | POA: Diagnosis present

## 2022-07-02 DIAGNOSIS — K529 Noninfective gastroenteritis and colitis, unspecified: Secondary | ICD-10-CM | POA: Diagnosis not present

## 2022-07-02 DIAGNOSIS — R310 Gross hematuria: Secondary | ICD-10-CM | POA: Diagnosis not present

## 2022-07-02 LAB — COMPREHENSIVE METABOLIC PANEL
ALT: 20 U/L (ref 0–44)
AST: 19 U/L (ref 15–41)
Albumin: 4 g/dL (ref 3.5–5.0)
Alkaline Phosphatase: 73 U/L (ref 38–126)
Anion gap: 7 (ref 5–15)
BUN: 22 mg/dL (ref 8–23)
CO2: 23 mmol/L (ref 22–32)
Calcium: 8.9 mg/dL (ref 8.9–10.3)
Chloride: 110 mmol/L (ref 98–111)
Creatinine, Ser: 1.12 mg/dL (ref 0.61–1.24)
GFR, Estimated: >60 mL/min (ref >60)
Glucose, Bld: 128 mg/dL — ABNORMAL HIGH (ref 70–99)
Potassium: 3.5 mmol/L (ref 3.5–5.1)
Sodium: 140 mmol/L (ref 135–145)
Total Bilirubin: 0.8 mg/dL (ref 0.3–1.2)
Total Protein: 7.3 g/dL (ref 6.5–8.1)

## 2022-07-02 LAB — CBC WITH DIFFERENTIAL/PLATELET
Abs Immature Granulocytes: 0.04 10*3/uL (ref 0.00–0.07)
Basophils Absolute: 0.1 10*3/uL (ref 0.0–0.1)
Basophils Relative: 1 %
Eosinophils Absolute: 0.9 10*3/uL — ABNORMAL HIGH (ref 0.0–0.5)
Eosinophils Relative: 6 %
HCT: 46.9 % (ref 39.0–52.0)
Hemoglobin: 15.7 g/dL (ref 13.0–17.0)
Immature Granulocytes: 0 %
Lymphocytes Relative: 17 %
Lymphs Abs: 2.4 10*3/uL (ref 0.7–4.0)
MCH: 29.7 pg (ref 26.0–34.0)
MCHC: 33.5 g/dL (ref 30.0–36.0)
MCV: 88.8 fL (ref 80.0–100.0)
Monocytes Absolute: 0.9 10*3/uL (ref 0.1–1.0)
Monocytes Relative: 6 %
Neutro Abs: 10 10*3/uL — ABNORMAL HIGH (ref 1.7–7.7)
Neutrophils Relative %: 70 %
Platelets: 351 10*3/uL (ref 150–400)
RBC: 5.28 MIL/uL (ref 4.22–5.81)
RDW: 13.8 % (ref 11.5–15.5)
WBC: 14.4 10*3/uL — ABNORMAL HIGH (ref 4.0–10.5)
nRBC: 0 % (ref 0.0–0.2)

## 2022-07-02 MED ORDER — AMOXICILLIN-POT CLAVULANATE 875-125 MG PO TABS: 1.0000 | ORAL_TABLET | Freq: Two times a day (BID) | ORAL | 0 refills | Status: AC

## 2022-07-02 MED ORDER — AMOXICILLIN-POT CLAVULANATE 875-125 MG PO TABS
1.0000 | ORAL_TABLET | Freq: Once | ORAL | Status: AC
  Administered 2022-07-02: 1 via ORAL
  Filled 2022-07-02: qty 1

## 2022-07-02 NOTE — ED Notes (Signed)
See triage note  Presents with some blood in urine  States he has had renal stones in past but this is not this same  Denies any pain

## 2022-07-02 NOTE — ED Provider Notes (Signed)
[facility] Provider Note    Event Date/Time   First MD Initiated Contact with Patient 07/02/22 [phone]     (approximate)   History   Hematuria   HPI  Gregory [name] is a 81 y.o. male with a history of previous kidney stones and distant history of prostate cancer followed by urology  At about midnight noticed urge to urinate, and bloody urine.  No fevers or chills.  No nausea or vomiting.  He has noticed that twice while urinating this morning he had a slight stinging feeling he has some concern he might be developing a urine infection.  No lightheadedness or weakness.  He otherwise feels well.  Denies being in any significant pain or having any severe pain, reports no pain at all.  He is not having any trouble emptying his bladder.  He follows with Dr. Erlene Quan of urology     Physical Exam   Triage Vital Signs: ED Triage Vitals  Enc Vitals Group     BP 07/02/22 0546 (!) 147/80     Pulse Rate 07/02/22 0546 (!) 102     Resp 07/02/22 0546 17     Temp 07/02/22 0546 98.6 F (37 C)     Temp Source 07/02/22 0546 Oral     SpO2 07/02/22 0546 95 %     Weight 07/02/22 0533 200 lb (90.7 kg)     Height 07/02/22 0533 '5\' 8"'$  (1.727 m)     Head Circumference --      Peak Flow --      Pain Score 07/02/22 0533 0     Pain Loc --      Pain Edu? --      Excl. in [place]? --     Most recent vital signs: Vitals:   07/02/22 0546 07/02/22 0744  BP: (!) 147/80 140/78  Pulse: (!) 102 90  Resp: 17 16  Temp: 98.6 F (37 C)   SpO2: 95% 96%     General: Awake, no distress.  Very pleasant in no distress.  Resting comfortably.  Fully oriented CV:  Good peripheral perfusion.  Resp:  Normal effort.  Abd:  No distention.  Denies abdominal pain Other:  Warm well perfused.  Fully alert well oriented   ED Results / Procedures / Treatments   Labs (all labs ordered are listed, but only abnormal results are displayed) Labs Reviewed  URINALYSIS, ROUTINE W REFLEX  MICROSCOPIC - Abnormal; Notable for the following components:      Result Value   Color, Urine RED (*)    APPearance CLOUDY (*)    Glucose, UA   (*)    Value: TEST NOT REPORTED DUE TO COLOR INTERFERENCE OF URINE PIGMENT   Hgb urine dipstick   (*)    Value: TEST NOT REPORTED DUE TO COLOR INTERFERENCE OF URINE PIGMENT   Bilirubin Urine   (*)    Value: TEST NOT REPORTED DUE TO COLOR INTERFERENCE OF URINE PIGMENT   Ketones, ur   (*)    Value: TEST NOT REPORTED DUE TO COLOR INTERFERENCE OF URINE PIGMENT   Protein, ur   (*)    Value: TEST NOT REPORTED DUE TO COLOR INTERFERENCE OF URINE PIGMENT   Nitrite   (*)    Value: TEST NOT REPORTED DUE TO COLOR INTERFERENCE OF URINE PIGMENT   Leukocytes,Ua   (*)    Value: TEST NOT REPORTED DUE TO COLOR INTERFERENCE OF URINE PIGMENT   RBC / HPF >50 (*)  Bacteria, UA FEW (*)    All other components within normal limits  CBC WITH DIFFERENTIAL/PLATELET - Abnormal; Notable for the following components:   WBC 14.4 (*)    Neutro Abs 10.0 (*)    Eosinophils Absolute 0.9 (*)    All other components within normal limits  COMPREHENSIVE METABOLIC PANEL - Abnormal; Notable for the following components:   Glucose, Bld 128 (*)    All other components within normal limits  URINE CULTURE   Labs reviewed urinalysis notable for bacteria and notable hematuria  EKG     RADIOLOGY   CT imaging interpreted by me, grossly I do not see evidence of acute obstruction.  See radiologist report below  CT Renal Stone Study  Result Date: 07/02/2022 CLINICAL DATA:  Flank pain.  Kidney stone suspected EXAM: CT ABDOMEN AND PELVIS WITHOUT CONTRAST TECHNIQUE: Multidetector CT imaging of the abdomen and pelvis was performed following the standard protocol without IV contrast. RADIATION DOSE REDUCTION: This exam was performed according to the departmental dose-optimization program which includes automated exposure control, adjustment of the mA and/or kV according to patient  size and/or use of iterative reconstruction technique. COMPARISON:  None Available. FINDINGS: Lower chest: Lung bases are clear. Hepatobiliary: Several low-density lesions in the liver consistent benign cysts. Postcholecystectomy. Pancreas: Pancreas is normal. No ductal dilatation. No pancreatic inflammation. Spleen: Normal spleen Adrenals/urinary tract: Adrenal glands normal. Several punctate calcifications in the LEFT kidney unchanged. No ureterolithiasis or obstructive uropathy. No bladder calculi. Exophytic from the upper pole of the LEFT kidney is a rounded lesion measuring 20 mm (image 27/2) increased from 15 mm (CT 02/22/2016). This lesion is equal density to the adjacent renal parenchyma. Additional low-density renal cysts are not significantly changed. Stomach/Bowel: Stomach, duodenum small-bowel normal. Appendix and cecum normal. Near complete resolution of the inflammation along the descending colon. Insert LEFT. Vascular/Lymphatic: Abdominal aorta is normal caliber with atherosclerotic calcification. There is no retroperitoneal or periportal lymphadenopathy. No pelvic lymphadenopathy. Reproductive: Post prostatectomy Other: No free fluid. Musculoskeletal: No aggressive osseous lesion. IMPRESSION: 1. Several small LEFT renal calculi unchanged. No ureterolithiasis or obstructive uropathy. 2. Exophytic soft tissue lesion extending from the upper pole of the LEFT kidney mildly enlarged from 2017. Recommend MRI without with contrast to evaluate for solid renal neoplasm. Electronically Signed   By: Suzy Bouchard M.D.   On: 07/02/2022 08:09      PROCEDURES:  Critical Care performed: No  Procedures   MEDICATIONS ORDERED IN ED: Medications  amoxicillin-clavulanate (AUGMENTIN) 875-125 MG per tablet 1 tablet (1 tablet Oral Given 07/02/22 0738)     IMPRESSION / MDM / ASSESSMENT AND PLAN / ED COURSE  I reviewed the triage vital signs and the nursing notes.                               Differential diagnosis includes, but is not limited to, urinary tract infection, kidney stone, renal tumor renal mass or other considerations or causes for hematuria such as prostatitis etc.  Based on the clinical history provided suspect urinary tract infection, he does have slight elevated white count and reports mild dysuria.  He denies any trouble emptying.  Patient's presentation is most consistent with acute illness / injury with system symptoms.    Discussed with both patient and his wife recommended treatment including antibiotic, have selected Augmentin as this showed good sensitivity to previous culture of E. coli and Proteus from a year ago.  Discussed  careful return precautions and I did recommend he follow-up closely with urology Dr. Erlene Quan.  Patient understands return precautions, also discussed the concerns of findings of a possible mass on his left kidney.  The patient and his wife advised they will follow-up closely with Dr. Erlene Quan and plan to call today for a follow-up appointment.  Antibiotics sent to patient's preferred pharmacy  Return precautions and treatment recommendations and follow-up discussed with the patient who is agreeable with the plan.    FINAL CLINICAL IMPRESSION(S) / ED DIAGNOSES   Final diagnoses:  Gross hematuria  Cystitis  Renal mass, left     Rx / DC Orders   ED Discharge Orders          Ordered    Ambulatory referral to Urology       Comments: ER follow-up, hematuria   07/02/22 0733    amoxicillin-clavulanate (AUGMENTIN) 875-125 MG tablet  2 times daily        07/02/22 0828             Note:  This document was prepared using Dragon voice recognition software and may include unintentional dictation errors.   Delman Kitten, MD 07/02/22 [phone]

## 2022-07-02 NOTE — ED Triage Notes (Signed)
Patient ambulatory to triage with steady gait, without difficulty or distress noted; pt reports hematuria since last night; denies any abd/back pain; st hx of same with UTI

## 2022-07-03 LAB — URINALYSIS, ROUTINE W REFLEX MICROSCOPIC
RBC / HPF: >50 RBC/hpf — ABNORMAL HIGH (ref 0–5)
Specific Gravity, Urine: 1.015 (ref 1.005–1.030)
Squamous Epithelial / HPF: NONE SEEN (ref 0–5)

## 2022-07-04 LAB — URINE CULTURE: Culture: >=100000 — AB

## 2022-07-14 ENCOUNTER — Ambulatory Visit: Payer: Medicare HMO | Admitting: Physician Assistant

## 2022-07-14 ENCOUNTER — Encounter: Payer: Self-pay | Admitting: Physician Assistant

## 2022-07-14 VITALS — BP 118/71 | HR 69 | Ht 68.0 in | Wt 205.0 lb

## 2022-07-14 DIAGNOSIS — R310 Gross hematuria: Secondary | ICD-10-CM | POA: Diagnosis not present

## 2022-07-14 DIAGNOSIS — N289 Disorder of kidney and ureter, unspecified: Secondary | ICD-10-CM

## 2022-07-14 LAB — URINALYSIS, COMPLETE
Bilirubin, UA: NEGATIVE
Glucose, UA: NEGATIVE
Ketones, UA: NEGATIVE
Leukocytes,UA: NEGATIVE
Nitrite, UA: NEGATIVE
RBC, UA: NEGATIVE
Specific Gravity, UA: 1.03 (ref 1.005–1.030)
Urobilinogen, Ur: 0.2 mg/dL (ref 0.2–1.0)
pH, UA: 5.5 (ref 5.0–7.5)

## 2022-07-14 LAB — MICROSCOPIC EXAMINATION: Bacteria, UA: NONE SEEN

## 2022-07-14 LAB — BLADDER SCAN AMB NON-IMAGING: SCA Result: 0

## 2022-07-14 NOTE — Progress Notes (Signed)
07/14/2022 4:29 PM   Gregory [name] 03-10-1941 [id number]  CC: Chief Complaint  Patient presents with   Hematuria   HPI: Gregory [name] is a 81 y.o. male with PMH nephrolithiasis, PUNLMP in 2012, prostate cancer s/p radical prostatectomy in 2013, stress incontinence, and urgency who presents today for outpatient follow-up.   He was seen in the emergency department 12 days ago with reports of gross hematuria and dysuria.  UA was notable for red color, hematuria, pyuria, and bacteriuria.  Urine culture finalized with pansensitive E. coli and he was treated with Augmentin twice daily x7 days.  CT stone study revealed nonobstructing left renal stones without ureteral stones.  There is also an exophytic lesion on the upper pole of the left kidney mildly enlarged compared to 2017.  Follow-up MRI was recommended.  Today he reports gross hematuria began to improve after 2 doses of antibiotics. His dysuria has resolved. He has no acute concerns today.  He has an allergy to iodinated contrast.  In-office UA today positive for 1+ protein; urine microscopy pan negative. PVR 83m.  PMH: Past Medical History:  Diagnosis Date   Actinic keratosis    Aortic root dilatation ([place] 10/06/2017   a.) TTE 10/06/2017: Ao root 431m   Arthritis    Basal cell carcinoma 08/21/2009   Left neck. Excised: 10/03/2009, margins free.   Coronary artery disease 10/06/2017   a.) LHC 10/06/2017: 60% D2, 100% 2nd RPLB, 60% 3rd RPLB, 40% pLAD, 40% mLAD, 60% OM1; Rx mgmt.   Diastolic dysfunction 1039/76/7341 a.) TTE 10/06/2017: EF 55-60%; G1DD.   Dysplastic nevus 08/23/2008   Right vertex. Moderate to severe atypia, bordering on early evolving MMis.   Dysplastic nevus 02/21/2010   Right vertex. Moderate to severe atypia, bordering on early evolving MMis.   GERD (gastroesophageal reflux disease)    History of acoustic neuroma 02/2004   a.) s/p crainotomy for removal --> complicated by CSF leak requiring  2nd crainotomy for internatal auditory canal closure, closure or dura, and placement of lumbar drain on 06/13/2004   History of kidney stones    HLD (hyperlipidemia)    Hypertension    Loss of equilibrium    left side only due to benign brain stem tumor in 2005   NSTEMI (non-ST elevated myocardial infarction) ([facility]/17/2018   a.) LHLouise0/17/2018: 60% D2, 100% 2nd RPLB, 60% 3rd RPLB, 40% pLAD, 40% mLAD, 60% OM1; intervention deferred opting for medical management   OSA (obstructive sleep apnea)    a.) does not require nocturnal PAP therapy   [place]state cancer ([place]   Rheumatic fever    Squamous cell carcinoma of skin 07/27/2018   [facility]  Surgical History: Past Surgical History:  Procedure Laterality Date   BLADDER SURGERY  12/21/2010   Dr. HaMadelin HeadingsARArgyleeft 02/2004   Procedure: CRANIOTOMY FOR ACOUSTIC NEUROMA REMOVAL; Location: [facility]   CRANIOTOMY FOR REPAIR DURAL / CSF LEAK N/A 06/13/2004   Procedure: SECONDARY SUBOCCIPITAL CRANIOTOMY WITH CLOSURE OF INTERNAL AUDITORY CANAL, CLOSURE OF DURA, AND PLACEMENT OF LUMBAR DRAIN; Location: [facility]   CYSTOSCOPY WITH RETROGRADE URETHROGRAM  03/16/2016   Procedure: CYSTOSCOPY WITH RETROGRADE URETHROGRAM;  Surgeon: AsHollice EspyMD;  Location: [facility];  Service: Urology;;   CYSTOSCOPY WITH STENT PLACEMENT Left 02/23/2016   Procedure: CYSTOSCOPY WITH STENT PLACEMENT;  Surgeon: RaDereck LeepMD;  Location: [facility];  Service: Urology;  Laterality: Left;  CYSTOSCOPY WITH STENT PLACEMENT Left 03/16/2016   Procedure: CYSTOSCOPY WITH STENT PLACEMENT/EXCHANGE;  Surgeon: Hollice Espy, MD;  Location: [facility];  Service: Urology;  Laterality: Left;   LEFT HEART CATH AND CORONARY ANGIOGRAPHY N/A 10/06/2017   Procedure: LEFT HEART CATH AND CORONARY ANGIOGRAPHY;  Surgeon: Minna Merritts, MD;  Location: [facility];  Service: Cardiovascular;  Laterality: N/A;   MELANOMA  EXCISION     PROSTATECTOMY  12/21/2001   Dr. Madelin Headings, [place] ARTHROSCOPY WITH SUBACROMIAL DECOMPRESSION, ROTATOR CUFF REPAIR AND BICEP TENDON REPAIR Left 02/05/2022   Procedure: SHOULDER ARTHROSCOPY WITH EXTENSIVE DEBRIDEMENT, DECOMPRESSION,;  Surgeon: Corky Mull, MD;  Location: [facility];  Service: Orthopedics;  Laterality: Left;   STONE EXTRACTION WITH BASKET  03/16/2016   Procedure: STONE EXTRACTION WITH BASKET;  Surgeon: Hollice Espy, MD;  Location: [facility];  Service: Urology;;    Home Medications:  Allergies as of 07/14/2022       Reactions   Iodine Anaphylaxis   Shellfish Allergy Anaphylaxis        Medication List        Accurate as of July 14, 2022  4:29 PM. If you have any questions, ask your nurse or doctor.          STOP taking these medications    predniSONE 10 MG (21) Tbpk tablet Commonly known as: STERAPRED UNI-PAK 21 TAB Stopped by: Debroah Loop, PA-C   traMADol 50 MG tablet Commonly known as: Ultram Stopped by: Debroah Loop, PA-C       TAKE these medications    aspirin EC 81 MG tablet Take 1 tablet (81 mg total) by mouth daily.   atenolol 25 MG tablet Commonly known as: TENORMIN Take 1 tablet (25 mg total) by mouth every morning.   atorvastatin 40 MG tablet Commonly known as: LIPITOR Take 1 tablet (40 mg total) by mouth every morning.   fesoterodine 8 MG Tb24 tablet Commonly known as: TOVIAZ Take 1 tablet (8 mg total) by mouth every morning.   hydrochlorothiazide 25 MG tablet Commonly known as: HYDRODIURIL Take 1 tablet (25 mg total) by mouth 3 (three) times a week.   nitroGLYCERIN 0.4 MG SL tablet Commonly known as: NITROSTAT Place 1 tablet (0.4 mg total) under the tongue every 5 (five) minutes as needed for chest pain (hold for blood pressure less than 90).   olmesartan 40 MG tablet Commonly known as: BENICAR Take 1 tablet (40 mg total) by mouth every morning.        Allergies:  Allergies   Allergen Reactions   Iodine Anaphylaxis   Shellfish Allergy Anaphylaxis    Family History: Family History  Problem Relation Age of Onset   CAD Father    Polycystic kidney disease Father    Diabetes Mellitus II Neg Hx    Hypertension Neg Hx     Social History:   reports that he has never smoked. He has never used smokeless tobacco. He reports that he does not drink alcohol and does not use drugs.  Physical Exam: BP 118/71   Pulse 69   Ht '5\' 8"'$  (1.727 m)   Wt 205 lb (93 kg)   BMI 31.17 kg/m   Constitutional:  Alert and oriented, no acute distress, nontoxic appearing HEENT: [place], AT Cardiovascular: No clubbing, cyanosis, or edema Respiratory: Normal respiratory effort, no increased work of breathing Skin: No rashes, bruises or suspicious lesions Neurologic: Grossly intact, no focal deficits, moving all 4 extremities Psychiatric: Normal mood and affect  Laboratory Data: Results for orders placed or performed in visit on 07/14/22  Microscopic Examination   Urine  Result Value Ref Range   WBC, UA 0-5 0 - 5 /hpf   RBC, Urine 0-2 0 - 2 /hpf   Epithelial Cells (non renal) 0-10 0 - 10 /hpf   Mucus, UA Present (A) Not Estab.   Bacteria, UA None seen None seen/Few  Urinalysis, Complete  Result Value Ref Range   Specific Gravity, UA 1.030 1.005 - 1.030   pH, UA 5.5 5.0 - 7.5   Color, UA Yellow Yellow   Appearance Ur Clear Clear   Leukocytes,UA Negative Negative   Protein,UA 1+ (A) Negative/Trace   Glucose, UA Negative Negative   Ketones, UA Negative Negative   RBC, UA Negative Negative   Bilirubin, UA Negative Negative   Urobilinogen, Ur 0.2 0.2 - 1.0 mg/dL   Nitrite, UA Negative Negative   Microscopic Examination See below:   Bladder Scan (Post Void Residual) in office  Result Value Ref Range   SCA Result 0    Pertinent Imaging: Results for orders placed during the hospital encounter of 07/02/22  CT Renal Stone Study  Narrative CLINICAL DATA:  Flank pain.   Kidney stone suspected  EXAM: CT ABDOMEN AND PELVIS WITHOUT CONTRAST  TECHNIQUE: Multidetector CT imaging of the abdomen and pelvis was performed following the standard protocol without IV contrast.  RADIATION DOSE REDUCTION: This exam was performed according to the departmental dose-optimization program which includes automated exposure control, adjustment of the mA and/or kV according to patient size and/or use of iterative reconstruction technique.  COMPARISON:  None Available.  FINDINGS: Lower chest: Lung bases are clear.  Hepatobiliary: Several low-density lesions in the liver consistent benign cysts. Postcholecystectomy.  Pancreas: Pancreas is normal. No ductal dilatation. No pancreatic inflammation.  Spleen: Normal spleen  Adrenals/urinary tract: Adrenal glands normal. Several punctate calcifications in the LEFT kidney unchanged. No ureterolithiasis or obstructive uropathy. No bladder calculi.  Exophytic from the upper pole of the LEFT kidney is a rounded lesion measuring 20 mm (image 27/2) increased from 15 mm (CT 02/22/2016). This lesion is equal density to the adjacent renal parenchyma.  Additional low-density renal cysts are not significantly changed.  Stomach/Bowel: Stomach, duodenum small-bowel normal. Appendix and cecum normal.  Near complete resolution of the inflammation along the descending colon. Insert LEFT.  Vascular/Lymphatic: Abdominal aorta is normal caliber with atherosclerotic calcification. There is no retroperitoneal or periportal lymphadenopathy. No pelvic lymphadenopathy.  Reproductive: Post prostatectomy  Other: No free fluid.  Musculoskeletal: No aggressive osseous lesion.  IMPRESSION: 1. Several small LEFT renal calculi unchanged. No ureterolithiasis or obstructive uropathy. 2. Exophytic soft tissue lesion extending from the upper pole of the LEFT kidney mildly enlarged from 2017. Recommend MRI without with contrast to evaluate  for solid renal neoplasm.   Electronically Signed By: Suzy Bouchard M.D. On: 07/02/2022 08:09   I personally reviewed the images referenced above and note a small exophytic left upper pole renal lesion, minimally enlarged compared to prior imaging from 2017.  Assessment & Plan:   1. Gross hematuria Resolved on culture appropriate antibiotics for acute cystitis.  No further work-up indicated.  We discussed return precautions including recurrent gross hematuria.  He expressed understanding. - Urinalysis, Complete - Bladder Scan (Post Void Residual) in office  2. Kidney lesion, native, left This lesion measures less than 2 cm in diameter and has enlarged less than 0.25 cm in diameter in the past 6 years.  Overall, I have  low concern for clinical significance of this lesion.  We discussed that if this renal lesion were to represent a renal cell carcinoma, given its small size and rather stable appearance, surveillance would be appropriate.  We discussed that his renal function remains normal and that given the location and exophytic nature of the lesion, alternative treatment options in the future would include but not be limited to cryoablation versus partial nephrectomy.  In shared decision-making with the patient, we have elected to defer further evaluation of this lesion at this time.  He can discuss this again with Dr. Erlene Quan at his next annual follow-up.  I will make her aware of this at this time as well.  Return if symptoms worsen or fail to improve.  Debroah Loop, PA-C  [facility] [address] [place], [place] [address] [phone]

## 2022-08-11 DIAGNOSIS — M25552 Pain in left hip: Secondary | ICD-10-CM | POA: Diagnosis not present

## 2022-08-11 DIAGNOSIS — M1612 Unilateral primary osteoarthritis, left hip: Secondary | ICD-10-CM | POA: Diagnosis not present

## 2022-08-11 DIAGNOSIS — M7062 Trochanteric bursitis, left hip: Secondary | ICD-10-CM | POA: Diagnosis not present

## 2022-08-12 ENCOUNTER — Other Ambulatory Visit: Payer: Self-pay | Admitting: Sports Medicine

## 2022-08-12 DIAGNOSIS — M7062 Trochanteric bursitis, left hip: Secondary | ICD-10-CM

## 2022-08-12 DIAGNOSIS — M25552 Pain in left hip: Secondary | ICD-10-CM

## 2022-08-17 ENCOUNTER — Ambulatory Visit
Admission: RE | Admit: 2022-08-17 | Discharge: 2022-08-17 | Disposition: A | Discharge status: Home / Self Care | Payer: Medicare HMO | Source: Ambulatory Visit | Attending: Sports Medicine | Admitting: Sports Medicine

## 2022-08-17 DIAGNOSIS — Z8546 Personal history of malignant neoplasm of prostate: Secondary | ICD-10-CM | POA: Diagnosis not present

## 2022-08-17 DIAGNOSIS — Z9889 Other specified postprocedural states: Secondary | ICD-10-CM | POA: Diagnosis not present

## 2022-08-17 DIAGNOSIS — M25552 Pain in left hip: Secondary | ICD-10-CM | POA: Insufficient documentation

## 2022-08-17 DIAGNOSIS — M7062 Trochanteric bursitis, left hip: Secondary | ICD-10-CM | POA: Diagnosis not present

## 2022-08-17 DIAGNOSIS — M1612 Unilateral primary osteoarthritis, left hip: Secondary | ICD-10-CM | POA: Diagnosis not present

## 2022-08-21 DIAGNOSIS — J3489 Other specified disorders of nose and nasal sinuses: Secondary | ICD-10-CM | POA: Diagnosis not present

## 2022-08-21 DIAGNOSIS — H6123 Impacted cerumen, bilateral: Secondary | ICD-10-CM | POA: Diagnosis not present

## 2022-08-21 DIAGNOSIS — J301 Allergic rhinitis due to pollen: Secondary | ICD-10-CM | POA: Diagnosis not present

## 2022-09-03 ENCOUNTER — Ambulatory Visit: Payer: Medicare HMO | Admitting: Dermatology

## 2022-09-03 ENCOUNTER — Encounter: Payer: Self-pay | Admitting: Dermatology

## 2022-09-03 DIAGNOSIS — Z1283 Encounter for screening for malignant neoplasm of skin: Secondary | ICD-10-CM

## 2022-09-03 DIAGNOSIS — L821 Other seborrheic keratosis: Secondary | ICD-10-CM | POA: Diagnosis not present

## 2022-09-03 DIAGNOSIS — L578 Other skin changes due to chronic exposure to nonionizing radiation: Secondary | ICD-10-CM | POA: Diagnosis not present

## 2022-09-03 DIAGNOSIS — Z86006 Personal history of melanoma in-situ: Secondary | ICD-10-CM

## 2022-09-03 DIAGNOSIS — D1801 Hemangioma of skin and subcutaneous tissue: Secondary | ICD-10-CM | POA: Diagnosis not present

## 2022-09-03 DIAGNOSIS — L570 Actinic keratosis: Secondary | ICD-10-CM | POA: Diagnosis not present

## 2022-09-03 DIAGNOSIS — L814 Other melanin hyperpigmentation: Secondary | ICD-10-CM | POA: Diagnosis not present

## 2022-09-03 DIAGNOSIS — D229 Melanocytic nevi, unspecified: Secondary | ICD-10-CM | POA: Diagnosis not present

## 2022-09-03 DIAGNOSIS — Z85828 Personal history of other malignant neoplasm of skin: Secondary | ICD-10-CM | POA: Diagnosis not present

## 2022-09-03 NOTE — Progress Notes (Unsigned)
Follow-Up Visit   Subjective  Gregory [name] is a 81 y.o. male who presents for the following: Annual Exam (HxBCC, SCC, DN and MIS (x2 2009, 2011)). The patient presents for Total-Body Skin Exam (TBSE) for skin cancer screening and mole check.  The patient has spots, moles and lesions to be evaluated, some may be new or changing and the patient has concerns that these could be cancer.   The following portions of the chart were reviewed this encounter and updated as appropriate:      Review of Systems: No other skin or systemic complaints except as noted in HPI or Assessment and Plan.   Objective  Well appearing patient in no apparent distress; mood and affect are within normal limits.  A full examination was performed including scalp, head, eyes, ears, nose, lips, neck, chest, axillae, abdomen, back, buttocks, bilateral upper extremities, bilateral lower extremities, hands, feet, fingers, toes, fingernails, and toenails. All findings within normal limits unless otherwise noted below.  forehead above right brow x1, left cheek x2, left lateral eyebrow x2 (5) Erythematous thin papules/macules with gritty scale.    Assessment & Plan   History of Melanoma in Situ. Right vertex scalp. 2009, 2011. Mod-severe atypical nevus bordering on early evolving MIS. - No evidence of recurrence today - Recommend regular full body skin exams - Recommend daily broad spectrum sunscreen SPF 30+ to sun-exposed areas, reapply every 2 hours as needed.  - Call if any new or changing lesions are noted between office visits   History of Squamous Cell Carcinoma of the Skin - No evidence of recurrence today - No lymphadenopathy - Recommend regular full body skin exams - Recommend daily broad spectrum sunscreen SPF 30+ to sun-exposed areas, reapply every 2 hours as needed.  - Call if any new or changing lesions are noted between office visits  History of Basal Cell Carcinoma of the Skin - No evidence of  recurrence today - Recommend regular full body skin exams - Recommend daily broad spectrum sunscreen SPF 30+ to sun-exposed areas, reapply every 2 hours as needed.  - Call if any new or changing lesions are noted between office visits   Lentigines - Scattered tan macules - Due to sun exposure - Benign-appearing, observe - Recommend daily broad spectrum sunscreen SPF 30+ to sun-exposed areas, reapply every 2 hours as needed. - Call for any changes  Seborrheic Keratoses - Stuck-on, waxy, tan-brown papules and/or plaques  - Benign-appearing - Discussed benign etiology and prognosis. - Observe - Call for any changes  Melanocytic Nevi - Tan-brown and/or pink-flesh-colored symmetric macules and papules - Benign appearing on exam today - Observation - Call clinic for new or changing moles - Recommend daily use of broad spectrum spf 30+ sunscreen to sun-exposed areas.   Hemangiomas - Red papules - Discussed benign nature - Observe - Call for any changes  Actinic Damage - Chronic condition, secondary to cumulative UV/sun exposure - diffuse scaly erythematous macules with underlying dyspigmentation - Recommend daily broad spectrum sunscreen SPF 30+ to sun-exposed areas, reapply every 2 hours as needed.  - Staying in the shade or wearing long sleeves, sun glasses (UVA+UVB protection) and wide brim hats (4-inch brim around the entire circumference of the hat) are also recommended for sun protection.  - Call for new or changing lesions.  Skin cancer screening performed today.  Dermatofibroma. Left posterior calf. - Firm pink/brown papulenodule with dimple sign - Benign appearing - Call for any changes   AK (actinic keratosis) (5)  forehead above right brow x1, left cheek x2, left lateral eyebrow x2  Actinic keratoses are precancerous spots that appear secondary to cumulative UV radiation exposure/sun exposure over time. They are chronic with expected duration over 1 year. A  portion of actinic keratoses will progress to squamous cell carcinoma of the skin. It is not possible to reliably predict which spots will progress to skin cancer and so treatment is recommended to prevent development of skin cancer.  Recommend daily broad spectrum sunscreen SPF 30+ to sun-exposed areas, reapply every 2 hours as needed.  Recommend staying in the shade or wearing long sleeves, sun glasses (UVA+UVB protection) and wide brim hats (4-inch brim around the entire circumference of the hat). Call for new or changing lesions.  Destruction of lesion - forehead above right brow x1, left cheek x2, left lateral eyebrow x2  Destruction method: cryotherapy   Informed consent: discussed and consent obtained   Lesion destroyed using liquid nitrogen: Yes   Region frozen until ice ball extended beyond lesion: Yes   Outcome: patient tolerated procedure well with no complications   Post-procedure details: wound care instructions given   Additional details:  Prior to procedure, discussed risks of blister formation, small wound, skin dyspigmentation, or rare scar following cryotherapy. Recommend Vaseline ointment to treated areas while healing.    Return in about 1 year (around 09/04/2023) for TBSE.  I, Emelia Salisbury, CMA, am acting as scribe for Forest Gleason, MD.

## 2022-09-03 NOTE — Patient Instructions (Addendum)
Cryotherapy Aftercare  Wash gently with soap and water everyday.   Apply Vaseline jelly daily until healed.    Recommend taking Heliocare sun protection supplement daily in sunny weather for additional sun protection. For maximum protection on the sunniest days, you can take up to 2 capsules of regular Heliocare OR take 1 capsule of Heliocare Ultra. For prolonged exposure (such as a full day in the sun), you can repeat your dose of the supplement 4 hours after your first dose. Heliocare can be purchased at [employer], at some [facility] or at [email or web address].     Recommend daily broad spectrum sunscreen SPF 30+ to sun-exposed areas, reapply every 2 hours as needed. Call for new or changing lesions.  Staying in the shade or wearing long sleeves, sun glasses (UVA+UVB protection) and wide brim hats (4-inch brim around the entire circumference of the hat) are also recommended for sun protection.    Melanoma ABCDEs  Melanoma is the most dangerous type of skin cancer, and is the leading cause of death from skin disease.  You are more likely to develop melanoma if you: Have light-colored skin, light-colored eyes, or red or blond hair Spend a lot of time in the sun Tan regularly, either outdoors or in a tanning bed Have had blistering sunburns, especially during childhood Have a close family member who has had a melanoma Have atypical moles or large birthmarks  Early detection of melanoma is key since treatment is typically straightforward and cure rates are extremely high if we catch it early.   The first sign of melanoma is often a change in a mole or a new dark spot.  The ABCDE system is a way of remembering the signs of melanoma.  A for asymmetry:  The two halves do not match. B for border:  The edges of the growth are irregular. C for color:  A mixture of colors are present instead of an even brown color. D for diameter:  Melanomas are usually (but not always) greater than 22m -  the size of a pencil eraser. E for evolution:  The spot keeps changing in size, shape, and color.  Please check your skin once per month between visits. You can use a small mirror in front and a large mirror behind you to keep an eye on the back side or your body.   If you see any new or changing lesions before your next follow-up, please call to schedule a visit.  Please continue daily skin protection including broad spectrum sunscreen SPF 30+ to sun-exposed areas, reapplying every 2 hours as needed when you're outdoors.   Staying in the shade or wearing long sleeves, sun glasses (UVA+UVB protection) and wide brim hats (4-inch brim around the entire circumference of the hat) are also recommended for sun protection.     Due to recent changes in healthcare laws, you may see results of your pathology and/or laboratory studies on MyChart before the doctors have had a chance to review them. We understand that in some cases there may be results that are confusing or concerning to you. Please understand that not all results are received at the same time and often the doctors may need to interpret multiple results in order to provide you with the best plan of care or course of treatment. Therefore, we ask that you please give u[place]2 business days to thoroughly review all your results before contacting the office for clarification. Should we see a critical lab result, you will be  contacted sooner.   If You Need Anything After Your Visit  If you have any questions or concerns for your doctor, please call our main line at [phone] and press option 4 to reach your doctor's medical assistant. If no one answers, please leave a voicemail as directed and we will return your call as soon as possible. Messages left after 4 pm will be answered the following business day.   You may also send [place] a message via [place]. We typically respond to MyChart messages within 1-2 business days.  For prescription refills,  please ask your pharmacy to contact our office. Our fax number is [phone].  If you have an urgent issue when the clinic is closed that cannot wait until the next business day, you can page your doctor at the number below.    Please note that while we do our best to be available for urgent issues outside of office hours, we are not available 24/7.   If you have an urgent issue and are unable to reach [place], you may choose to seek medical care at your doctor's office, retail clinic, urgent care center, or emergency room.  If you have a medical emergency, please immediately call 911 or go to the emergency department.  Pager Numbers  - Dr. Nehemiah Massed: [phone]  - Dr. Laurence Ferrari: [phone]  - Dr. Nicole Kindred: [phone]  In the event of inclement weather, please call our main line at [phone] for an update on the status of any delays or closures.  Dermatology Medication Tips: Please keep the boxes that topical medications come in in order to help keep track of the instructions about where and how to use these. Pharmacies typically print the medication instructions only on the boxes and not directly on the medication tubes.   If your medication is too expensive, please contact our office at [phone] option 4 or send [place] a message through [place].   We are unable to tell what your co-pay for medications will be in advance as this is different depending on your insurance coverage. However, we may be able to find a substitute medication at lower cost or fill out paperwork to get insurance to cover a needed medication.   If a prior authorization is required to get your medication covered by your insurance company, please allow [place] 1-2 business days to complete this process.  Drug prices often vary depending on where the prescription is filled and some pharmacies may offer cheaper prices.  The website www.goodrx.com contains coupons for medications through different pharmacies. The prices  here do not account for what the cost may be with help from insurance (it may be cheaper with your insurance), but the website can give you the price if you did not use any insurance.  - You can print the associated coupon and take it with your prescription to the pharmacy.  - You may also stop by our office during regular business hours and pick up a GoodRx coupon card.  - If you need your prescription sent electronically to a different pharmacy, notify our office through [facility] or by phone at [phone] option 4.     Si Usted Necesita Algo Despus de Su Visita  Tambin puede enviarnos un mensaje a travs de Pharmacist, community. Por lo general respondemos a los mensajes de MyChart en el transcurso de 1 a 2 das hbiles.  Para renovar recetas, por favor pida a su farmacia que se ponga en contacto con nuestra oficina. Harland Dingwall de fax es  el [phone].  Si tiene un asunto urgente cuando la clnica est cerrada y que no puede esperar hasta el siguiente da hbil, puede llamar/localizar a su doctor(a) al nmero que aparece a continuacin.   Por favor, tenga en cuenta que aunque hacemos todo lo posible para estar disponibles para asuntos urgentes fuera del horario de [facility], no estamos disponibles las 24 horas del da, los 7 das de la [place].   Si tiene un problema urgente y no puede comunicarse con nosotros, puede optar por buscar atencin mdica  en el consultorio de su doctor(a), en una clnica privada, en un centro de atencin urgente o en una sala de emergencias.  Si tiene Engineering geologist, por favor llame inmediatamente al 911 o vaya a la sala de emergencias.  Nmeros de bper  - Dr. Nehemiah Massed: [phone]  - Dra. Moye: [phone]  - Dra. Nicole Kindred: [phone]  En caso de inclemencias del [place], por favor llame a Johnsie Kindred principal al [phone] para una actualizacin sobre el [place] de cualquier retraso o cierre.  Consejos para la medicacin en  dermatologa: Por favor, guarde las cajas en las que vienen los medicamentos de uso tpico para ayudarle a seguir las instrucciones sobre dnde y cmo usarlos. Las farmacias generalmente imprimen las instrucciones del medicamento slo en las cajas y no directamente en los tubos del [place].   Si su medicamento es muy caro, por favor, pngase en contacto con Zigmund Daniel llamando al [phone] y presione la opcin 4 o envenos un mensaje a travs de Pharmacist, community.   No podemos decirle cul ser su copago por los medicamentos por adelantado ya que esto es diferente dependiendo de la cobertura de su seguro. Sin embargo, es posible que podamos encontrar un medicamento sustituto a Electrical engineer un formulario para que el seguro cubra el medicamento que se considera necesario.   Si se requiere una autorizacin previa para que su compaa de seguros [place] su medicamento, por favor permtanos de 1 a 2 das hbiles para completar este proceso.  Los precios de los medicamentos varan con frecuencia dependiendo del Environmental consultant de dnde se surte la receta y alguna farmacias pueden ofrecer precios ms baratos.  El sitio web www.goodrx.com tiene cupones para medicamentos de Airline pilot. Los precios aqu no tienen en cuenta lo que podra costar con la ayuda del seguro (puede ser ms barato con su seguro), pero el sitio web puede darle el precio si no utiliz Research scientist (physical sciences).  - Puede imprimir el cupn correspondiente y llevarlo con su receta a la farmacia.  - Tambin puede pasar por nuestra oficina durante el horario de atencin regular y Charity fundraiser una tarjeta de cupones de GoodRx.  - Si necesita que su receta se enve electrnicamente a una farmacia diferente, informe a nuestra oficina a travs de MyChart de [place] o por telfono llamando al [phone] y presione la opcin 4.

## 2022-09-04 ENCOUNTER — Encounter: Payer: Self-pay | Admitting: Dermatology

## 2022-09-07 DIAGNOSIS — M7062 Trochanteric bursitis, left hip: Secondary | ICD-10-CM | POA: Diagnosis not present

## 2022-09-07 DIAGNOSIS — M25552 Pain in left hip: Secondary | ICD-10-CM | POA: Diagnosis not present

## 2022-09-07 DIAGNOSIS — M67959 Unspecified disorder of synovium and tendon, unspecified thigh: Secondary | ICD-10-CM | POA: Diagnosis not present

## 2022-09-07 DIAGNOSIS — M7602 Gluteal tendinitis, left hip: Secondary | ICD-10-CM | POA: Diagnosis not present

## 2022-09-07 DIAGNOSIS — M1612 Unilateral primary osteoarthritis, left hip: Secondary | ICD-10-CM | POA: Diagnosis not present

## 2022-09-10 DIAGNOSIS — Z23 Encounter for immunization: Secondary | ICD-10-CM | POA: Diagnosis not present

## 2022-09-22 DIAGNOSIS — M1612 Unilateral primary osteoarthritis, left hip: Secondary | ICD-10-CM | POA: Diagnosis not present

## 2022-09-22 DIAGNOSIS — M25552 Pain in left hip: Secondary | ICD-10-CM | POA: Diagnosis not present

## 2022-09-22 DIAGNOSIS — M7062 Trochanteric bursitis, left hip: Secondary | ICD-10-CM | POA: Diagnosis not present

## 2022-09-29 DIAGNOSIS — M1612 Unilateral primary osteoarthritis, left hip: Secondary | ICD-10-CM | POA: Diagnosis not present

## 2022-09-29 DIAGNOSIS — M7062 Trochanteric bursitis, left hip: Secondary | ICD-10-CM | POA: Diagnosis not present

## 2022-09-29 DIAGNOSIS — M25552 Pain in left hip: Secondary | ICD-10-CM | POA: Diagnosis not present

## 2022-10-02 ENCOUNTER — Telehealth: Payer: Self-pay | Admitting: Cardiovascular Disease

## 2022-10-02 NOTE — Telephone Encounter (Signed)
Attempted to call the patient. No answer- message received stating the call could not be completed at this time x 2 attempts.    Will attempt to contact the patient at a later time.

## 2022-10-02 NOTE — Telephone Encounter (Signed)
Pt c/o BP issue: STAT if pt c/o blurred vision, one-sided weakness or slurred speech  1. What are your last 5 BP readings?  167-170 and as low as 111   2. Are you having any other symptoms (ex. Dizziness, headache, blurred vision, passed out)? Dehydrated, yesterday pt felt dizzy   3. What is your BP issue?  Pt calling stating her bp has been up and down and he felt a little dizzy yesterday. Pt wants to know what he should do

## 2022-10-06 DIAGNOSIS — H81399 Other peripheral vertigo, unspecified ear: Secondary | ICD-10-CM | POA: Diagnosis not present

## 2022-10-06 DIAGNOSIS — I2510 Atherosclerotic heart disease of native coronary artery without angina pectoris: Secondary | ICD-10-CM | POA: Insufficient documentation

## 2022-10-06 DIAGNOSIS — I10 Essential (primary) hypertension: Secondary | ICD-10-CM | POA: Diagnosis not present

## 2022-10-06 DIAGNOSIS — R42 Dizziness and giddiness: Secondary | ICD-10-CM | POA: Diagnosis not present

## 2022-10-06 DIAGNOSIS — G319 Degenerative disease of nervous system, unspecified: Secondary | ICD-10-CM | POA: Diagnosis not present

## 2022-10-06 NOTE — Telephone Encounter (Signed)
Attempted to call the patient. Message again received stating the call could not be completed at this time.

## 2022-10-07 ENCOUNTER — Other Ambulatory Visit: Payer: Self-pay

## 2022-10-07 ENCOUNTER — Emergency Department
Admission: EM | Admit: 2022-10-07 | Discharge: 2022-10-07 | Disposition: A | Discharge status: Home / Self Care | Payer: Medicare HMO | Attending: Emergency Medicine | Admitting: Emergency Medicine

## 2022-10-07 ENCOUNTER — Encounter: Payer: Self-pay | Admitting: Emergency Medicine

## 2022-10-07 ENCOUNTER — Emergency Department: Payer: Medicare HMO

## 2022-10-07 DIAGNOSIS — R42 Dizziness and giddiness: Secondary | ICD-10-CM | POA: Diagnosis not present

## 2022-10-07 DIAGNOSIS — H81399 Other peripheral vertigo, unspecified ear: Secondary | ICD-10-CM

## 2022-10-07 DIAGNOSIS — G319 Degenerative disease of nervous system, unspecified: Secondary | ICD-10-CM | POA: Diagnosis not present

## 2022-10-07 LAB — CBC
HCT: 54.6 % — ABNORMAL HIGH (ref 39.0–52.0)
Hemoglobin: 17.8 g/dL — ABNORMAL HIGH (ref 13.0–17.0)
MCH: 30.6 pg (ref 26.0–34.0)
MCHC: 32.6 g/dL (ref 30.0–36.0)
MCV: 93.8 fL (ref 80.0–100.0)
Platelets: 251 10*3/uL (ref 150–400)
RBC: 5.82 MIL/uL — ABNORMAL HIGH (ref 4.22–5.81)
RDW: 14.2 % (ref 11.5–15.5)
WBC: 11.7 10*3/uL — ABNORMAL HIGH (ref 4.0–10.5)
nRBC: 0 % (ref 0.0–0.2)

## 2022-10-07 LAB — BASIC METABOLIC PANEL
Anion gap: 11 (ref 5–15)
BUN: 18 mg/dL (ref 8–23)
CO2: 26 mmol/L (ref 22–32)
Calcium: 9.4 mg/dL (ref 8.9–10.3)
Chloride: 103 mmol/L (ref 98–111)
Creatinine, Ser: 1.14 mg/dL (ref 0.61–1.24)
GFR, Estimated: >60 mL/min (ref >60)
Glucose, Bld: 127 mg/dL — ABNORMAL HIGH (ref 70–99)
Potassium: 3.5 mmol/L (ref 3.5–5.1)
Sodium: 140 mmol/L (ref 135–145)

## 2022-10-07 LAB — TROPONIN I (HIGH SENSITIVITY)
Troponin I (High Sensitivity): 11 ng/L (ref <18)
Troponin I (High Sensitivity): 11 ng/L (ref <18)

## 2022-10-07 MED ORDER — MECLIZINE HCL 25 MG PO TABS: 25.0000 mg | ORAL_TABLET | Freq: Three times a day (TID) | ORAL | 0 refills | Status: DC | PRN

## 2022-10-07 MED ORDER — MECLIZINE HCL 25 MG PO TABS
25.0000 mg | ORAL_TABLET | Freq: Once | ORAL | Status: AC
  Administered 2022-10-07: 25 mg via ORAL
  Filled 2022-10-07: qty 1

## 2022-10-07 NOTE — ED Triage Notes (Signed)
Pt to triage via w/c with no distress noted; st dizziness x 3-4 days with no accomp symptoms; denies hx of same

## 2022-10-07 NOTE — ED Provider Notes (Signed)
[facility] Provider Note    Event Date/Time   First MD Initiated Contact with Patient 10/07/22 [phone]     (approximate)   History   Chief Complaint Dizziness   HPI  Gregory [name] is a 81 y.o. male with past medical history of hypertension, hyperlipidemia, CAD, and acoustic neuroma status post craniotomy who presents to the ED complaining of dizziness.  Patient reports that for the past 2 days he has been dealing with intermittent dizziness and feeling like the room is spinning around him.  He states that this started initially when he went to look under his truck trailer 2 days ago, but has become more severe over the past 24 hours.  He denies any association with positional changes, states that he has felt nauseous at times without any vomiting.  He denies any associated vision changes, speech changes, numbness, or weakness.  He has not had any lightheadedness, chest pain, or shortness of breath.  He reports dealing with vertigo at times in the past, but has never taken medication for it.     Physical Exam   Triage Vital Signs: ED Triage Vitals  Enc Vitals Group     BP 10/07/22 0008 (!) 140/80     Pulse Rate 10/07/22 0008 64     Resp 10/07/22 0008 17     Temp 10/07/22 0008 97.6 F (36.4 C)     Temp Source 10/07/22 0008 Oral     SpO2 10/07/22 0008 96 %     Weight 10/07/22 0003 203 lb (92.1 kg)     Height 10/07/22 0003 '5\' 8"'$  (1.727 m)     Head Circumference --      Peak Flow --      Pain Score 10/07/22 0003 0     Pain Loc --      Pain Edu? --      Excl. in [place]? --     Most recent vital signs: Vitals:   10/07/22 0535 10/07/22 0540  BP: 127/76 127/76  Pulse:  66  Resp: 18 16  Temp:    SpO2:  98%    Constitutional: Alert and oriented. Eyes: Conjunctivae are normal. Head: Atraumatic. Nose: No congestion/rhinnorhea. Mouth/Throat: Mucous membranes are moist.  Cardiovascular: Normal rate, regular rhythm. Grossly normal heart sounds.  2+  radial pulses bilaterally. Respiratory: Normal respiratory effort.  No retractions. Lungs CTAB. Gastrointestinal: Soft and nontender. No distention. Musculoskeletal: No lower extremity tenderness nor edema.  Neurologic:  Normal speech and language. No gross focal neurologic deficits are appreciated.    ED Results / Procedures / Treatments   Labs (all labs ordered are listed, but only abnormal results are displayed) Labs Reviewed  BASIC METABOLIC PANEL - Abnormal; Notable for the following components:      Result Value   Glucose, Bld 127 (*)    All other components within normal limits  CBC - Abnormal; Notable for the following components:   WBC 11.7 (*)    RBC 5.82 (*)    Hemoglobin 17.8 (*)    HCT 54.6 (*)    All other components within normal limits  URINALYSIS, ROUTINE W REFLEX MICROSCOPIC  TROPONIN I (HIGH SENSITIVITY)  TROPONIN I (HIGH SENSITIVITY)     EKG  ED ECG REPORT I, Blake Divine, the attending physician, personally viewed and interpreted this ECG.   Date: 10/07/2022  EKG Time: 00:13  Rate: 64  Rhythm: normal sinus rhythm  Axis: Normal  Intervals:first-degree A-V block   ST&T Change:  Inferior Q waves, similar to previous  RADIOLOGY CT head reviewed and interpreted by me with no hemorrhage or midline shift.  PROCEDURES:  Critical Care performed: No  Procedures   MEDICATIONS ORDERED IN ED: Medications  meclizine (ANTIVERT) tablet 25 mg (25 mg Oral Given 10/07/22 0537)     IMPRESSION / MDM / ASSESSMENT AND PLAN / ED COURSE  I reviewed the triage vital signs and the nursing notes.                              81 y.o. male with past medical history of hypertension, hyperlipidemia, CAD, and acoustic neuroma status postcraniotomy who presents to the ED complaining of intermittent dizziness over the past 2 days that has become more severe over the past 24 hours.  Patient's presentation is most consistent with acute presentation with potential  threat to life or bodily function.  Differential diagnosis includes, but is not limited to, stroke, TIA, electrolyte abnormality, AKI, peripheral vertigo, arrhythmia, anemia.  Patient nontoxic-appearing and in no acute distress, vital signs are unremarkable.  He has no focal neurologic deficits on exam and CT head negative for acute process.  Given relatively acute onset of dizziness not associated with positional changes, MRI was performed and is negative for acute stroke.  MRI does show chronic changes related to prior craniotomy for acoustic neuroma.  EKG shows no evidence of arrhythmia or ischemia and 2 sets of troponin are negative, doubt cardiac etiology for his symptoms.  Labs are reassuring with no significant anemia, leukocytosis, electrolyte abnormality, or AKI.  He denies any urinary symptoms concerning for UTI.  Suspect symptoms are due to peripheral vertigo and we will treat with meclizine and reassess.  Patient with improved symptoms following dose of meclizine, still has some dizziness upon standing but much milder than earlier this evening.  He is appropriate for discharge home with PCP follow-up, will be prescribed meclizine to take as needed.  He was counseled to return to the ED for new or worsening symptoms, patient agrees with plan.      FINAL CLINICAL IMPRESSION(S) / ED DIAGNOSES   Final diagnoses:  Peripheral vertigo, unspecified laterality  Dizziness     Rx / DC Orders   ED Discharge Orders          Ordered    meclizine (ANTIVERT) 25 MG tablet  3 times daily PRN        10/07/22 6979             Note:  This document was prepared using Dragon voice recognition software and may include unintentional dictation errors.   Blake Divine, MD 10/07/22 [phone]

## 2022-10-08 NOTE — Telephone Encounter (Signed)
Reviewed the patient's chart.  He was seen in the ED yesterday for dizziness.   Per ER note: Suspect symptoms are due to peripheral vertigo and we will treat with meclizine and reassess.  Patient with improved symptoms following dose of meclizine, still has some dizziness upon standing but much milder than earlier this evening.  He is appropriate for discharge home with PCP follow-up, will be prescribed meclizine to take as needed.  He was counseled to return to the ED for new or worsening symptoms, patient agrees with plan.   Will close the encounter.

## 2022-10-14 DIAGNOSIS — R42 Dizziness and giddiness: Secondary | ICD-10-CM | POA: Diagnosis not present

## 2022-10-14 DIAGNOSIS — C61 Malignant neoplasm of prostate: Secondary | ICD-10-CM | POA: Diagnosis not present

## 2022-10-14 DIAGNOSIS — I2510 Atherosclerotic heart disease of native coronary artery without angina pectoris: Secondary | ICD-10-CM | POA: Diagnosis not present

## 2022-10-14 DIAGNOSIS — Z86018 Personal history of other benign neoplasm: Secondary | ICD-10-CM | POA: Diagnosis not present

## 2022-10-14 DIAGNOSIS — I10 Essential (primary) hypertension: Secondary | ICD-10-CM | POA: Diagnosis not present

## 2022-10-17 NOTE — Progress Notes (Unsigned)
Cardiology Office Note  Date:  10/19/2022   ID:  Gregory [name], DOB Apr 17, 1941, MRN [id number]  PCP:  Tracie Harrier, MD   Chief Complaint  Patient presents with   12 month follow up     Patient c/o dizziness from sitting to standing & blood pressure fluctuating. Medications reviewed by the patient verbally.     HPI:  81 year old gentleman with  CAD on cardiac catheterization October 2018 Occluded PDA, collaterals, medical management recommended morbid obesity,  prostate cancer status post resection,  essential hypertension  who denies any prior cardiac history,  He presents for follow-up of his coronary artery disease  Last seen by myself in clinic November 2022  Reports that he was recently changing filters in house, looking up helping his wife Developed dizziness/vertigo symptoms As symptoms did not resolve, went to the emergency room for further evaluation  Head CT scan no acute findings Given meclizine TID, symptoms slowly improving Now down to daily  Still working, sells trailers, spends time in the office  Orthostatics today:  130/62 sitting Standing : 116/72 After 3 min: 122/68  BP today at home 120s/, takes all 4 of his blood pressure medications in the morning Sometimes reports blood pressure will drop in the mornings less than 789 systolic  Labs reviewed Total chol 130, LDL 78 A1C 5.9  Hip pain on left Denies chest pain concerning for angina, no shortness of breath on exertion  EKG personally reviewed by myself on todays visit Nsr rate 61 bpm consider old inferior MI, no change from prior EKG  Prior brain surgery 2005, subsequent hearing issue, Arthritides, numbness  Medical history reviewed Cath  10/06/2017 showed Occluded PDA at the ostium, small vessel Moderate disease of OM1 proximal region, Moderate to severe disease of proximal PL branch,  Moderate disease of ostial/proximal diagonal branch #2,  Mild diffuse disease of  LAD. Medical management was recommended of his residual disease, no intervention performed Occluded PDA has collaterals, diffusely diseased likely with thrombus.  He was started on aspirin Plavix, statin Already on beta-blocker  Echocardiogram normal LV function  PMH:   has a past medical history of Actinic keratosis, Aortic root dilatation (HCC) (10/06/2017), Arthritis, Basal cell carcinoma (08/21/2009), Coronary artery disease (38/09/1750), Diastolic dysfunction (02/58/5277), Dysplastic nevus (08/23/2008), Dysplastic nevus (02/21/2010), GERD (gastroesophageal reflux disease), History of acoustic neuroma (02/2004), History of kidney stones, HLD (hyperlipidemia), Hypertension, Loss of equilibrium, NSTEMI (non-ST elevated myocardial infarction) ([place]) (10/06/2017), OSA (obstructive sleep apnea), Prostate cancer ([place]), Rheumatic fever, and Squamous cell carcinoma of skin (07/27/2018).  PSH:    Past Surgical History:  Procedure Laterality Date   BLADDER SURGERY  12/21/2010   Dr. Madelin Headings, [place] Left 02/2004   Procedure: CRANIOTOMY FOR ACOUSTIC NEUROMA REMOVAL; Location: [facility]   CRANIOTOMY FOR REPAIR DURAL / CSF LEAK N/A 06/13/2004   Procedure: SECONDARY SUBOCCIPITAL CRANIOTOMY WITH CLOSURE OF INTERNAL AUDITORY CANAL, CLOSURE OF DURA, AND PLACEMENT OF LUMBAR DRAIN; Location: [facility]   CYSTOSCOPY WITH RETROGRADE URETHROGRAM  03/16/2016   Procedure: CYSTOSCOPY WITH RETROGRADE URETHROGRAM;  Surgeon: Hollice Espy, MD;  Location: [facility];  Service: Urology;;   CYSTOSCOPY WITH STENT PLACEMENT Left 02/23/2016   Procedure: CYSTOSCOPY WITH STENT PLACEMENT;  Surgeon: Dereck Leep, MD;  Location: [facility];  Service: Urology;  Laterality: Left;   CYSTOSCOPY WITH STENT PLACEMENT Left 03/16/2016   Procedure: CYSTOSCOPY WITH STENT PLACEMENT/EXCHANGE;  Surgeon: Hollice Espy, MD;  Location: [facility];  Service: Urology;  Laterality: Left;  LEFT HEART CATH AND CORONARY  ANGIOGRAPHY N/A 10/06/2017   Procedure: LEFT HEART CATH AND CORONARY ANGIOGRAPHY;  Surgeon: Minna Merritts, MD;  Location: [facility];  Service: Cardiovascular;  Laterality: N/A;   MELANOMA EXCISION     PROSTATECTOMY  12/21/2001   Dr. Madelin Headings, [place] ARTHROSCOPY WITH SUBACROMIAL DECOMPRESSION, ROTATOR CUFF REPAIR AND BICEP TENDON REPAIR Left 02/05/2022   Procedure: SHOULDER ARTHROSCOPY WITH EXTENSIVE DEBRIDEMENT, DECOMPRESSION,;  Surgeon: Corky Mull, MD;  Location: [facility];  Service: Orthopedics;  Laterality: Left;   STONE EXTRACTION WITH BASKET  03/16/2016   Procedure: STONE EXTRACTION WITH BASKET;  Surgeon: Hollice Espy, MD;  Location: [facility];  Service: Urology;;    Current Outpatient Medications  Medication Sig Dispense Refill   amLODipine (NORVASC) 10 MG tablet Take 10 mg by mouth daily.     aspirin EC 81 MG EC tablet Take 1 tablet (81 mg total) by mouth daily. 30 tablet 0   atenolol (TENORMIN) 25 MG tablet Take 1 tablet (25 mg total) by mouth every morning.     atorvastatin (LIPITOR) 40 MG tablet Take 1 tablet (40 mg total) by mouth every morning.     fesoterodine (TOVIAZ) 8 MG TB24 tablet Take 1 tablet (8 mg total) by mouth every morning.     hydrochlorothiazide (HYDRODIURIL) 25 MG tablet Take 1 tablet (25 mg total) by mouth 3 (three) times a week. 36 tablet 3   meclizine (ANTIVERT) 25 MG tablet Take 1 tablet (25 mg total) by mouth 3 (three) times daily as needed for dizziness. 30 tablet 0   nitroGLYCERIN (NITROSTAT) 0.4 MG SL tablet Place 1 tablet (0.4 mg total) under the tongue every 5 (five) minutes as needed for chest pain (hold for blood pressure less than 90). 30 tablet 0   olmesartan (BENICAR) 40 MG tablet Take 1 tablet (40 mg total) by mouth every morning.     No current facility-administered medications for this visit.    Allergies:   Iodine and Shellfish allergy   Social History:  The patient  reports that he has never smoked. He has never used  smokeless tobacco. He reports that he does not drink alcohol and does not use drugs.   Family History:   family history includes CAD in his father; Polycystic kidney disease in his father.    Review of Systems: Review of Systems  Constitutional: Negative.   HENT: Negative.    Respiratory: Negative.    Cardiovascular: Negative.   Gastrointestinal: Negative.   Musculoskeletal:  Positive for joint pain.       Gait instability  Neurological: Negative.  Negative for loss of consciousness.  Psychiatric/Behavioral: Negative.    All other systems reviewed and are negative.   PHYSICAL EXAM: VS:  BP 130/62 (BP Location: Left Arm, Patient Position: Sitting, Cuff Size: Normal)   Pulse 61   Ht '5\' 8"'$  (1.727 m)   Wt 202 lb 2 oz (91.7 kg)   SpO2 96%   BMI 30.73 kg/m  , BMI Body mass index is 30.73 kg/m. Constitutional:  oriented to person, place, and time. No distress.  HENT:  Head: Grossly normal Eyes:  no discharge. No scleral icterus.  Neck: No JVD, no carotid bruits  Cardiovascular: Regular rate and rhythm, no murmurs appreciated Pulmonary/Chest: Clear to auscultation bilaterally, no wheezes or rails Abdominal: Soft.  no distension.  no tenderness.  Musculoskeletal: Normal range of motion Neurological:  normal muscle tone. Coordination normal. No atrophy Skin: Skin warm and dry  Psychiatric: normal affect, pleasant   Recent Labs: 07/02/2022: ALT 20 10/07/2022: BUN 18; Creatinine, Ser 1.14; Hemoglobin 17.8; Platelets 251; Potassium 3.5; Sodium 140    Lipid Panel Lab Results  Component Value Date   CHOL 167 10/06/2017   HDL 37 (L) 10/06/2017   LDLCALC 104 (H) 10/06/2017   TRIG 128 10/06/2017      Wt Readings from Last 3 Encounters:  10/19/22 202 lb 2 oz (91.7 kg)  10/07/22 203 lb (92.1 kg)  07/14/22 205 lb (93 kg)     ASSESSMENT AND PLAN:  Coronary artery disease of native artery of native heart with stable angina pectoris (HCC) Currently with no symptoms of angina.  No further workup at this time. Continue current medication regimen.  Essential hypertension We have recommended that he move 2 of 4 BP meds to the PM for chronic dizziness, orthostasis in the morning Recommended he move the atenolol and olmesartan into the p.m., and continue amlodipine and HCTZ in the morning  Mixed hyperlipidemia Cholesterol is at goal on the current lipid regimen. No changes to the medications were made.  Diabetes type 2 with complications No regular exercise program, weight stable, still working A1c stable, 6  Non-ST elevation (NSTEMI) myocardial infarction [facility]) Stay on aspirin,  Cholesterol and diabetes at goal   Total encounter time more than 30 minutes  Greater than 50% was spent in counseling and coordination of care with the patient    Orders Placed This Encounter  Procedures   EKG 12-Lead     Signed, Esmond Plants, M.D., Ph.D. 10/19/2022  [place], [place] [phone]

## 2022-10-19 ENCOUNTER — Ambulatory Visit: Payer: Medicare HMO | Attending: Cardiovascular Disease | Admitting: Cardiovascular Disease

## 2022-10-19 ENCOUNTER — Encounter: Payer: Self-pay | Admitting: Cardiovascular Disease

## 2022-10-19 VITALS — BP 130/62 | HR 61 | Ht 68.0 in | Wt 202.1 lb

## 2022-10-19 DIAGNOSIS — E782 Mixed hyperlipidemia: Secondary | ICD-10-CM

## 2022-10-19 DIAGNOSIS — I10 Essential (primary) hypertension: Secondary | ICD-10-CM | POA: Diagnosis not present

## 2022-10-19 DIAGNOSIS — E118 Type 2 diabetes mellitus with unspecified complications: Secondary | ICD-10-CM | POA: Diagnosis not present

## 2022-10-19 DIAGNOSIS — R0989 Other specified symptoms and signs involving the circulatory and respiratory systems: Secondary | ICD-10-CM | POA: Diagnosis not present

## 2022-10-19 DIAGNOSIS — I25118 Atherosclerotic heart disease of native coronary artery with other forms of angina pectoris: Secondary | ICD-10-CM

## 2022-10-19 MED ORDER — ATENOLOL 25 MG PO TABS: ORAL_TABLET | ORAL | Status: DC

## 2022-10-19 MED ORDER — OLMESARTAN MEDOXOMIL 40 MG PO TABS: ORAL_TABLET | ORAL | Status: DC

## 2022-10-19 NOTE — Patient Instructions (Addendum)
Medication Instructions:  - Your physician has recommended you make the following change in your medication:   1) MOVE the atenolol and olmesartan to the evening (same doses)  2) KEEP Amlodipine and HCTZ in the morning (same doses)  See if this helps stop drop in pressure  If you need a refill on your cardiac medications before your next appointment, please call your pharmacy.    Lab work: No new labs needed   Testing/Procedures:  1) Carotid Ultrasound: (bruit on the right)  - Your physician has requested that you have a carotid duplex. This test is an ultrasound of the carotid arteries in your neck. It looks at blood flow through these arteries that supply the brain with blood. Allow one hour for this exam. There are no restrictions or special instructions.    Follow-Up: At [facility], you and your health needs are our priority.  As part of our continuing mission to provide you with exceptional heart care, we have created designated Provider Care Teams.  These Care Teams include your primary Cardiologist (physician) and Advanced Practice Providers (APPs -  Physician Assistants and Nurse Practitioners) who all work together to provide you with the care you need, when you need it.  You will need a follow up appointment in 12 months  Providers on your designated Care Team:   Murray Hodgkins, NP Christell Faith, PA-C Cadence Kathlen Mody, [place]  COVID-19 Vaccine Information can be found at: [email or web address] For questions related to vaccine distribution or appointments, please email [email or web address] or call [phone].

## 2022-10-22 ENCOUNTER — Other Ambulatory Visit: Payer: Self-pay | Admitting: Cardiovascular Disease

## 2022-11-05 DIAGNOSIS — H401111 Primary open-angle glaucoma, right eye, mild stage: Secondary | ICD-10-CM | POA: Diagnosis not present

## 2022-12-10 ENCOUNTER — Ambulatory Visit: Payer: Medicare HMO | Attending: Cardiovascular Disease

## 2022-12-10 DIAGNOSIS — R0989 Other specified symptoms and signs involving the circulatory and respiratory systems: Secondary | ICD-10-CM | POA: Diagnosis not present

## 2022-12-17 ENCOUNTER — Other Ambulatory Visit: Payer: Self-pay | Admitting: Urology

## 2022-12-24 ENCOUNTER — Encounter: Payer: Self-pay | Admitting: *Deleted

## 2022-12-24 DIAGNOSIS — H401111 Primary open-angle glaucoma, right eye, mild stage: Secondary | ICD-10-CM | POA: Diagnosis not present

## 2023-01-15 DIAGNOSIS — I2510 Atherosclerotic heart disease of native coronary artery without angina pectoris: Secondary | ICD-10-CM | POA: Diagnosis not present

## 2023-01-15 DIAGNOSIS — G8929 Other chronic pain: Secondary | ICD-10-CM | POA: Diagnosis not present

## 2023-01-15 DIAGNOSIS — R7309 Other abnormal glucose: Secondary | ICD-10-CM | POA: Diagnosis not present

## 2023-01-15 DIAGNOSIS — Z125 Encounter for screening for malignant neoplasm of prostate: Secondary | ICD-10-CM | POA: Diagnosis not present

## 2023-01-15 DIAGNOSIS — I10 Essential (primary) hypertension: Secondary | ICD-10-CM | POA: Diagnosis not present

## 2023-01-15 DIAGNOSIS — Z6831 Body mass index (BMI) 31.0-31.9, adult: Secondary | ICD-10-CM | POA: Diagnosis not present

## 2023-01-15 DIAGNOSIS — Z85820 Personal history of malignant melanoma of skin: Secondary | ICD-10-CM | POA: Diagnosis not present

## 2023-01-15 DIAGNOSIS — M25552 Pain in left hip: Secondary | ICD-10-CM | POA: Diagnosis not present

## 2023-01-15 DIAGNOSIS — C61 Malignant neoplasm of prostate: Secondary | ICD-10-CM | POA: Diagnosis not present

## 2023-01-22 DIAGNOSIS — Z0000 Encounter for general adult medical examination without abnormal findings: Secondary | ICD-10-CM | POA: Diagnosis not present

## 2023-01-22 DIAGNOSIS — Z86018 Personal history of other benign neoplasm: Secondary | ICD-10-CM | POA: Diagnosis not present

## 2023-01-22 DIAGNOSIS — Z8546 Personal history of malignant neoplasm of prostate: Secondary | ICD-10-CM | POA: Diagnosis not present

## 2023-01-22 DIAGNOSIS — I10 Essential (primary) hypertension: Secondary | ICD-10-CM | POA: Diagnosis not present

## 2023-01-22 DIAGNOSIS — Z6831 Body mass index (BMI) 31.0-31.9, adult: Secondary | ICD-10-CM | POA: Diagnosis not present

## 2023-01-22 DIAGNOSIS — I2510 Atherosclerotic heart disease of native coronary artery without angina pectoris: Secondary | ICD-10-CM | POA: Diagnosis not present

## 2023-01-22 DIAGNOSIS — R739 Hyperglycemia, unspecified: Secondary | ICD-10-CM | POA: Diagnosis not present

## 2023-02-19 DIAGNOSIS — K922 Gastrointestinal hemorrhage, unspecified: Secondary | ICD-10-CM

## 2023-02-19 HISTORY — DX: Gastrointestinal hemorrhage, unspecified: K92.2

## 2023-03-04 ENCOUNTER — Observation Stay
Admission: EM | Admit: 2023-03-04 | Discharge: 2023-03-05 | Disposition: A | Discharge status: Home / Self Care | Payer: Medicare HMO | Attending: Internal Medicine | Admitting: Internal Medicine

## 2023-03-04 ENCOUNTER — Other Ambulatory Visit: Payer: Self-pay

## 2023-03-04 ENCOUNTER — Observation Stay: Payer: Medicare HMO

## 2023-03-04 DIAGNOSIS — Z85828 Personal history of other malignant neoplasm of skin: Secondary | ICD-10-CM | POA: Diagnosis not present

## 2023-03-04 DIAGNOSIS — D122 Benign neoplasm of ascending colon: Secondary | ICD-10-CM | POA: Insufficient documentation

## 2023-03-04 DIAGNOSIS — Z79899 Other long term (current) drug therapy: Secondary | ICD-10-CM | POA: Diagnosis not present

## 2023-03-04 DIAGNOSIS — Z7982 Long term (current) use of aspirin: Secondary | ICD-10-CM | POA: Insufficient documentation

## 2023-03-04 DIAGNOSIS — K922 Gastrointestinal hemorrhage, unspecified: Principal | ICD-10-CM | POA: Insufficient documentation

## 2023-03-04 DIAGNOSIS — I10 Essential (primary) hypertension: Secondary | ICD-10-CM | POA: Insufficient documentation

## 2023-03-04 DIAGNOSIS — K921 Melena: Secondary | ICD-10-CM | POA: Diagnosis not present

## 2023-03-04 DIAGNOSIS — I2510 Atherosclerotic heart disease of native coronary artery without angina pectoris: Secondary | ICD-10-CM | POA: Diagnosis not present

## 2023-03-04 DIAGNOSIS — K5730 Diverticulosis of large intestine without perforation or abscess without bleeding: Secondary | ICD-10-CM | POA: Insufficient documentation

## 2023-03-04 DIAGNOSIS — E785 Hyperlipidemia, unspecified: Secondary | ICD-10-CM | POA: Diagnosis present

## 2023-03-04 DIAGNOSIS — K640 First degree hemorrhoids: Secondary | ICD-10-CM | POA: Diagnosis not present

## 2023-03-04 DIAGNOSIS — I5189 Other ill-defined heart diseases: Secondary | ICD-10-CM

## 2023-03-04 DIAGNOSIS — K625 Hemorrhage of anus and rectum: Secondary | ICD-10-CM

## 2023-03-04 DIAGNOSIS — N200 Calculus of kidney: Secondary | ICD-10-CM | POA: Diagnosis not present

## 2023-03-04 DIAGNOSIS — I25119 Atherosclerotic heart disease of native coronary artery with unspecified angina pectoris: Secondary | ICD-10-CM | POA: Diagnosis not present

## 2023-03-04 DIAGNOSIS — R001 Bradycardia, unspecified: Secondary | ICD-10-CM | POA: Diagnosis not present

## 2023-03-04 LAB — CBC WITH DIFFERENTIAL/PLATELET
Abs Immature Granulocytes: 0.03 10*3/uL (ref 0.00–0.07)
Basophils Absolute: 0.1 10*3/uL (ref 0.0–0.1)
Basophils Relative: 1 %
Eosinophils Absolute: 0.2 10*3/uL (ref 0.0–0.5)
Eosinophils Relative: 2 %
HCT: 46.2 % (ref 39.0–52.0)
Hemoglobin: 15.5 g/dL (ref 13.0–17.0)
Immature Granulocytes: 0 %
Lymphocytes Relative: 20 %
Lymphs Abs: 1.8 10*3/uL (ref 0.7–4.0)
MCH: 30.2 pg (ref 26.0–34.0)
MCHC: 33.5 g/dL (ref 30.0–36.0)
MCV: 89.9 fL (ref 80.0–100.0)
Monocytes Absolute: 0.6 10*3/uL (ref 0.1–1.0)
Monocytes Relative: 6 %
Neutro Abs: 6.4 10*3/uL (ref 1.7–7.7)
Neutrophils Relative %: 71 %
Platelets: 257 10*3/uL (ref 150–400)
RBC: 5.14 MIL/uL (ref 4.22–5.81)
RDW: 14 % (ref 11.5–15.5)
WBC: 9.1 10*3/uL (ref 4.0–10.5)
nRBC: 0 % (ref 0.0–0.2)

## 2023-03-04 LAB — COMPREHENSIVE METABOLIC PANEL
ALT: 25 U/L (ref 0–44)
AST: 24 U/L (ref 15–41)
Albumin: 3.8 g/dL (ref 3.5–5.0)
Alkaline Phosphatase: 65 U/L (ref 38–126)
Anion gap: 5 (ref 5–15)
BUN: 18 mg/dL (ref 8–23)
CO2: 25 mmol/L (ref 22–32)
Calcium: 8.8 mg/dL — ABNORMAL LOW (ref 8.9–10.3)
Chloride: 107 mmol/L (ref 98–111)
Creatinine, Ser: 1.1 mg/dL (ref 0.61–1.24)
GFR, Estimated: >60 mL/min (ref >60)
Glucose, Bld: 109 mg/dL — ABNORMAL HIGH (ref 70–99)
Potassium: 3.5 mmol/L (ref 3.5–5.1)
Sodium: 137 mmol/L (ref 135–145)
Total Bilirubin: 1.1 mg/dL (ref 0.3–1.2)
Total Protein: 7 g/dL (ref 6.5–8.1)

## 2023-03-04 LAB — TYPE AND SCREEN
ABO/RH(D): B NEG
Antibody Screen: NEGATIVE

## 2023-03-04 LAB — HEMOGLOBIN
Hemoglobin: 15.4 g/dL (ref 13.0–17.0)
Hemoglobin: 14.5 g/dL (ref 13.0–17.0)

## 2023-03-04 LAB — PROTIME-INR
INR: 1.1 (ref 0.8–1.2)
Prothrombin Time: 14.1 seconds (ref 11.4–15.2)

## 2023-03-04 LAB — APTT: aPTT: 33 seconds (ref 24–36)

## 2023-03-04 MED ORDER — PEG 3350-KCL-NA BICARB-NACL 420 G PO SOLR
4000.0000 mL | Freq: Once | ORAL | Status: AC
  Administered 2023-03-04: 4000 mL via ORAL
  Filled 2023-03-04: qty 4000

## 2023-03-04 MED ORDER — ONDANSETRON HCL 4 MG PO TABS: 4.0000 mg | ORAL_TABLET | Freq: Four times a day (QID) | ORAL | Status: DC | PRN

## 2023-03-04 MED ORDER — IOHEXOL 350 MG/ML SOLN
100.0000 mL | Freq: Once | INTRAVENOUS | Status: AC | PRN
  Administered 2023-03-04: 100 mL via INTRAVENOUS

## 2023-03-04 MED ORDER — SODIUM CHLORIDE 0.9 % IV SOLN: INTRAVENOUS | Status: DC

## 2023-03-04 MED ORDER — FESOTERODINE FUMARATE ER 8 MG PO TB24
8.0000 mg | ORAL_TABLET | Freq: Every day | ORAL | Status: DC
  Filled 2023-03-04 (×2): qty 1

## 2023-03-04 MED ORDER — DIPHENHYDRAMINE HCL 50 MG/ML IJ SOLN
50.0000 mg | Freq: Once | INTRAMUSCULAR | Status: AC
  Administered 2023-03-04: 50 mg via INTRAVENOUS
  Filled 2023-03-04: qty 1

## 2023-03-04 MED ORDER — METHYLPREDNISOLONE SODIUM SUCC 40 MG IJ SOLR
40.0000 mg | Freq: Once | INTRAMUSCULAR | Status: AC
  Administered 2023-03-04: 40 mg via INTRAVENOUS
  Filled 2023-03-04: qty 1

## 2023-03-04 MED ORDER — ONDANSETRON HCL 4 MG/2ML IJ SOLN: 4.0000 mg | Freq: Four times a day (QID) | INTRAMUSCULAR | Status: DC | PRN

## 2023-03-04 MED ORDER — ATENOLOL 25 MG PO TABS
25.0000 mg | ORAL_TABLET | Freq: Every evening | ORAL | Status: DC
  Filled 2023-03-04 (×2): qty 1

## 2023-03-04 MED ORDER — AMLODIPINE BESYLATE 10 MG PO TABS: 10.0000 mg | ORAL_TABLET | Freq: Every day | ORAL | Status: DC

## 2023-03-04 MED ORDER — NITROGLYCERIN 0.4 MG SL SUBL: 0.4000 mg | SUBLINGUAL_TABLET | SUBLINGUAL | Status: DC | PRN

## 2023-03-04 NOTE — ED Notes (Signed)
RN and SRN at bedside for recollect type and screen

## 2023-03-04 NOTE — Assessment & Plan Note (Signed)
TTE 10/06/2017: EF 55-60%; G1DD. Euvolemic Denies and daily weights Follow

## 2023-03-04 NOTE — ED Triage Notes (Signed)
Pt here with rectal bleeding. Pt states his bleeding started an hour ago, dark in color per pt. Pt denies abd pain, N, or V. Pt states he is on a blood thinner.

## 2023-03-04 NOTE — ED Notes (Signed)
Blood occult exam performed by MD with this RN as chaperone

## 2023-03-04 NOTE — Assessment & Plan Note (Signed)
Patient with 1 large episode of bright red blood per rectum x 1 earlier this morning No associated abdominal pain, nausea or vomiting. Reports a remote history of hemorrhoids in the past per patient On baby aspirin Hemoglobin stable at 17.8 Will trend hemoglobin Per ER physician Dr. Nickolas Madrid, Dr. Haig Prophet with on-call GI is of aware of the case Keep n.p.o. Transfuse for hemoglobin of less than 7 Follow-up formal gastroenterology recs Follow

## 2023-03-04 NOTE — ED Notes (Signed)
EDP at bedside  

## 2023-03-04 NOTE — Assessment & Plan Note (Signed)
BP stable Titrate home regimen 

## 2023-03-04 NOTE — ED Provider Notes (Signed)
[facility] Provider Note    Event Date/Time   First MD Initiated Contact with Patient 03/04/23 [phone]     (approximate)   History   Rectal Bleeding   HPI  Gregory [name] is a 82 y.o. male with history of coronary disease, hypertension, hyperlipidemia who comes in with concerns for rectal bleeding.  Patient reports having history of hemorrhoids.  He denies ever having bleeding like this however.  He states that he has had normal bowel movements that are soft nature.  However this morning around 6 AM he felt like he had some gas he went to go use the toilet he noticed bright red blood coming out a significant amount that he is never had before.  He denies this previously denies any abdominal pain chest pain shortness of breath falls in his head or any other concerns.  He reports his last colonoscopy was in 2014 he thinks that was reassuring.  He does report that his brother did die after colonoscopy perforation in the setting of a renal transplant patient.  I reviewed patient's records and he does have a history of diverticulosis and diverticulitis as well as this could be a diverticular bleed.      Physical Exam   Triage Vital Signs: ED Triage Vitals  Enc Vitals Group     BP 03/04/23 0742 133/80     Pulse Rate 03/04/23 0742 (!) 57     Resp 03/04/23 0742 18     Temp 03/04/23 0742 97.7 F (36.5 C)     Temp Source 03/04/23 0742 Oral     SpO2 03/04/23 0742 97 %     Weight 03/04/23 0742 202 lb 2.6 oz (91.7 kg)     Height 03/04/23 0741 '5\' 8"'$  (1.727 m)     Head Circumference --      Peak Flow --      Pain Score 03/04/23 0742 0     Pain Loc --      Pain Edu? --      Excl. in [place]? --     Most recent vital signs: Vitals:   03/04/23 0742  BP: 133/80  Pulse: (!) 57  Resp: 18  Temp: 97.7 F (36.5 C)  SpO2: 97%     General: Awake, no distress.  CV:  Good peripheral perfusion.  Resp:  Normal effort.  Abd:  No distention. Soft non  tender Other:  + BRB on rectal exam   ED Results / Procedures / Treatments   Labs (all labs ordered are listed, but only abnormal results are displayed) Labs Reviewed  COMPREHENSIVE METABOLIC PANEL - Abnormal; Notable for the following components:      Result Value   Glucose, Bld 109 (*)    Calcium 8.8 (*)    All other components within normal limits  CBC WITH DIFFERENTIAL/PLATELET  PROTIME-INR  APTT  TYPE AND SCREEN     EKG  My interpretation of EKG:  Sinus bradycardia rate of 57 without any ST elevation or T wave inversions    PROCEDURES:  Critical Care performed: No  .1-3 Lead EKG Interpretation  Performed by: Vanessa Findlay, MD Authorized by: Vanessa [place], MD     Interpretation: normal     ECG rate:  50   ECG rate assessment: normal     Rhythm: sinus bradycardia     Ectopy: none     Conduction: normal      MEDICATIONS ORDERED IN ED: Medications - No data  to display   IMPRESSION / MDM / [place] / ED COURSE  I reviewed the triage vital signs and the nursing notes.   Patient's presentation is most consistent with acute presentation with potential threat to life or bodily function.   Patient comes in with rectal bleeding no prior history of this significant blood noted on rectal exam.  He does have a pad with some blood on it as well.  His  lab work was ordered evaluate for any coagulopathy, anemia.  I suspect most likely diverticular bleed versus hemorrhoid bleed.  He is currently hemodynamically stable.  We discussed CT angio to rule out active bleed but given hemodynamically stable, no abdominal pain low suspicion for ischemic colitis I think we can hold off at this time.  He has a reported history of anaphylaxis to contrast although patient reports he is actually never had contrast that he remembers that he thinks it was just listed because of the shellfish allergy so I query if this is really a real contrast allergy or not.  I did send a  message to Dr. Haig Prophet GI for patient to get seen by them as well.  I will discuss with hospital team for admission for concern for GI bleed  CBC shows hemoglobin of 15.5 stable from prior but bleeding just started.  CMP reassuring.  Coags normal  The patient is on the cardiac monitor to evaluate for evidence of arrhythmia and/or significant heart rate changes.      FINAL CLINICAL IMPRESSION(S) / ED DIAGNOSES   Final diagnoses:  Rectal bleeding     Rx / DC Orders   ED Discharge Orders     None        Note:  This document was prepared using Dragon voice recognition software and may include unintentional dictation errors.   Vanessa Proctor, MD 03/04/23 [phone]

## 2023-03-04 NOTE — H&P (Signed)
History and Physical    Patient: Gregory [name] [id number] DOB: 02-21-1941 DOA: 03/04/2023 DOS: the patient was seen and examined on 03/04/2023 PCP: Tracie Harrier, MD  Patient coming from: Home  Chief Complaint:  Chief Complaint  Patient presents with   Rectal Bleeding   HPI: Gregory [name] is a 82 y.o. male with medical history significant of coronary artery disease, diastolic dysfunction, GERD, hyperlipidemia, hypertension presenting with GI bleeding.  Patient reports having 1 very large episode of bright red blood per rectum earlier today after getting up to use the bathroom.  Patient denies any prior episodes like this in the past.  No abdominal pain.  No fevers or chills.  No nausea or vomiting.  Patient reports a remote history of hemorrhoids in the past.  Had a colonoscopy around 2015.  Currently takes a baby aspirin daily.  No reported high-dose NSAID use.  No reported strenuous activity.  No hemiparesis or confusion. Presented to the ER afebrile, hemodynamically stable.  Hemoglobin 17.8, white count 11.7.  Creatinine 1.1.  Per ER physician Dr. Nickolas Madrid, case discussed with on-call gastroenterologist Dr. Haig Prophet. Review of Systems: As mentioned in the history of present illness. All other systems reviewed and are negative. Past Medical History:  Diagnosis Date   Actinic keratosis    Aortic root dilatation ([place]) 10/06/2017   a.) TTE 10/06/2017: Ao root 71m.   Arthritis    Basal cell carcinoma 08/21/2009   Left neck. Excised: 10/03/2009, margins free.   Coronary artery disease 10/06/2017   a.) LHC 10/06/2017: 60% D2, 100% 2nd RPLB, 60% 3rd RPLB, 40% pLAD, 40% mLAD, 60% OM1; Rx mgmt.   Diastolic dysfunction 10000000  a.) TTE 10/06/2017: EF 55-60%; G1DD.   Dysplastic nevus 08/23/2008   Right vertex. Moderate to severe atypia, bordering on early evolving MMis.   Dysplastic nevus 02/21/2010   Right vertex. Moderate to severe atypia, bordering on early evolving MMis.    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  Past Surgical History:  Procedure Laterality Date   BLADDER SURGERY  12/21/2010   Dr. [place] Headings [place]2004   Procedure: CRANIOTOMY FOR ACOUSTIC NEUROMA REMOVAL; Location: [facility]   CRANIOTOMY FOR REPAIR DURAL / CSF LEAK N/A 06/13/2004   Procedure: SECONDARY SUBOCCIPITAL CRANIOTOMY WITH CLOSURE OF INTERNAL AUDITORY CANAL, CLOSURE OF DURA, AND PLACEMENT OF LUMBAR DRAIN; Location: [facility]   CYSTOSCOPY WITH RETROGRADE URETHROGRAM  03/16/2016   Procedure: CYSTOSCOPY WITH RETROGRADE URETHROGRAM;  Surgeon: AHollice Espy MD;  Location: [facility];  Service: Urology;;   CYSTOSCOPY WITH [place] 02/23/2016   Procedure: CYSTOSCOPY WITH  STENT PLACEMENT;  Surgeon: Dereck Leep, MD;  Location: [facility];  Service: Urology;  Laterality: Left;   CYSTOSCOPY WITH STENT PLACEMENT Left 03/16/2016   Procedure: CYSTOSCOPY WITH STENT PLACEMENT/EXCHANGE;  Surgeon: Hollice Espy, MD;  Location: [facility];  Service: Urology;  Laterality: Left;   LEFT HEART CATH AND CORONARY ANGIOGRAPHY N/A 10/06/2017   Procedure: LEFT HEART CATH AND CORONARY  ANGIOGRAPHY;  Surgeon: Minna Merritts, MD;  Location: [place] [facility];  Service: Cardiovascular;  Laterality: N/A;   MELANOMA EXCISION     PROSTATECTOMY  12/21/2001   Dr. Madelin Headings, [place] ARTHROSCOPY WITH SUBACROMIAL DECOMPRESSION, ROTATOR CUFF REPAIR AND BICEP TENDON REPAIR Left 02/05/2022   Procedure: SHOULDER ARTHROSCOPY WITH EXTENSIVE DEBRIDEMENT, DECOMPRESSION,;  Surgeon: Corky Mull, MD;  Location: [facility];  Service: Orthopedics;  Laterality: Left;   STONE EXTRACTION WITH BASKET  03/16/2016   Procedure: STONE EXTRACTION WITH BASKET;  Surgeon: Hollice Espy, MD;  Location: [facility];  Service: Urology;;   Social History:  reports that he has never smoked. He has never used smokeless tobacco. He reports that he does not drink alcohol and does not use drugs.  Allergies  Allergen Reactions   Iodine Anaphylaxis   Shellfish Allergy Anaphylaxis    Family History  Problem Relation Age of Onset   CAD Father    Polycystic kidney disease Father    Diabetes Mellitus II Neg Hx    Hypertension Neg Hx     Prior to Admission medications   Medication Sig Start Date End Date Taking? Authorizing Provider  latanoprost (XALATAN) 0.005 % ophthalmic solution Place 1 drop into both eyes at bedtime. 12/29/22  Yes [provider]  amLODipine (NORVASC) 10 MG tablet Take 1 tablet (10 mg total) by mouth in the morning. 10/22/22   Minna Merritts, MD  aspirin EC 81 MG EC tablet Take 1 tablet (81 mg total) by mouth daily. 10/07/17   Hillary Bow, MD  atenolol (TENORMIN) 25 MG tablet Take 1 tablet (25 mg total) by mouth every evening. TAKE 1 TABLET EVERY DAY 10/22/22   Minna Merritts, MD  atorvastatin (LIPITOR) 40 MG tablet TAKE 1 TABLET EVERY DAY 10/22/22   Minna Merritts, MD  fesoterodine (TOVIAZ) 8 MG TB24 tablet TAKE 1 TABLET EVERY DAY 12/17/22   Hollice Espy, MD  hydrochlorothiazide (HYDRODIURIL) 25 MG tablet Take 1 tablet (25 mg total) by mouth 3 (three) times a week.  10/29/21 10/19/22  Minna Merritts, MD  meclizine (ANTIVERT) 25 MG tablet Take 1 tablet (25 mg total) by mouth 3 (three) times daily as needed for dizziness. 10/07/22   Blake Divine, MD  nitroGLYCERIN (NITROSTAT) 0.4 MG SL tablet Place 1 tablet (0.4 mg total) under the tongue every 5 (five) minutes as needed for chest pain (hold for blood pressure less than 90). 10/07/17   Hillary Bow, MD  olmesartan (BENICAR) 40 MG tablet Take 1 tablet (40 mg total) by mouth every evening. 10/22/22   Minna Merritts, MD    Physical Exam: Vitals:   03/04/23 0742 03/04/23 0900 03/04/23 1000 03/04/23 1015  BP: 133/80   (!) 145/69  Pulse: (!) 57 (!) 54 (!) 57 (!) 57  Resp: '18 19 14 11  '$ Temp: 97.7 F (36.5 C)     TempSrc: Oral     SpO2: 97% 95% 98% 96%  Weight: 91.7 kg     Height: '5\' 8"'$  (1.727 m)      Physical Exam Constitutional:  General: He is not in acute distress.    Appearance: He is obese.  HENT:     Head: Normocephalic and atraumatic.     Nose: Nose normal.     Mouth/Throat:     Mouth: Mucous membranes are moist.  Eyes:     Pupils: Pupils are equal, round, and reactive to light.  Cardiovascular:     Rate and Rhythm: Normal rate and regular rhythm.  Pulmonary:     Effort: Pulmonary effort is normal.     Breath sounds: Normal breath sounds.  Abdominal:     General: Bowel sounds are normal.     Tenderness: There is no abdominal tenderness.  Musculoskeletal:        General: Normal range of motion.     Cervical back: Normal range of motion.  Skin:    General: Skin is warm.  Neurological:     General: No focal deficit present.  Psychiatric:        Mood and Affect: Mood normal.     Data Reviewed:  There are no new results to review at this time. Lab Results  Component Value Date   WBC 9.1 03/04/2023   HGB 15.5 03/04/2023   HCT 46.2 03/04/2023   MCV 89.9 03/04/2023   PLT 257 [id number]   Last metabolic panel Lab Results  Component Value Date   GLUCOSE 109 (H)  03/04/2023   NA 137 03/04/2023   K 3.5 03/04/2023   CL 107 03/04/2023   CO2 25 03/04/2023   BUN 18 03/04/2023   CREATININE 1.10 03/04/2023   GFRNONAA >60 03/04/2023   CALCIUM 8.8 (L) 03/04/2023   PROT 7.0 03/04/2023   ALBUMIN 3.8 03/04/2023   BILITOT 1.1 03/04/2023   ALKPHOS 65 03/04/2023   AST 24 03/04/2023   ALT 25 03/04/2023   ANIONGAP 5 03/04/2023    Assessment and Plan: * GIB (gastrointestinal bleeding) Patient with 1 large episode of bright red blood per rectum x 1 earlier this morning No associated abdominal pain, nausea or vomiting. Reports a remote history of hemorrhoids in the past per patient On baby aspirin Hemoglobin stable at 17.8 Will trend hemoglobin Per ER physician Dr. Nickolas Madrid, Dr. Haig Prophet with on-call GI is of aware of the case Keep n.p.o. Transfuse for hemoglobin of less than 7 Follow-up formal gastroenterology recs Follow  Diastolic dysfunction TTE 0000000: EF 55-60%; G1DD. Euvolemic Denies and daily weights Follow  Coronary artery disease Baseline history of NSTEMI back in 2018 On aspirin No active chest pain Will hold aspirin for now in the setting of GI bleeding As needed nitroglycerin Follow Cardiology consult as clinically indicated  Essential hypertension BP stable Titrate home regimen      Advance Care Planning:   Code Status: Full Code   Consults: Dr. Haig Prophet w/ GI per Dr. Jari Pigg in the ED   Family Communication: Wife at the bedside   Severity of Illness: The appropriate patient status for this patient is OBSERVATION. Observation status is judged to be reasonable and necessary in order to provide the required intensity of service to ensure the patient's safety. The patient's presenting symptoms, physical exam findings, and initial radiographic and laboratory data in the context of their medical condition is felt to place them at decreased risk for further clinical deterioration. Furthermore, it is anticipated that the patient  will be medically stable for discharge from the hospital within 2 midnights of admission.   Author: Deneise Lever, MD 03/04/2023 10:41 AM  For on call review [email or web address].

## 2023-03-04 NOTE — Assessment & Plan Note (Signed)
Baseline history of NSTEMI back in 2018 On aspirin No active chest pain Will hold aspirin for now in the setting of GI bleeding As needed nitroglycerin Follow Cardiology consult as clinically indicated

## 2023-03-04 NOTE — Consult Note (Signed)
Consultation  Referring Provider:     Dr Ernestina Patches Admit date 03/04/23 Consult date       03/14/23  Reason for Consultation:     rectal bleeding         HPI:   Gregory [name] is a 82 y.o. male with medical history significant of coronary artery disease, diastolic dysfunction, GERD, hyperlipidemia, hypertension who presented to the ED today with acute onset of rectal bleeding at 0600 this am. His initial hemoglobin was 15 at 0751 today and 15.4 at 1150. PT/INt normal BUn normal. He has been admitted for observation. Gi has been asked to evaluate the rectal bleeding. Patient reports he was feeling well. States he woke this am feeling like he needed to defecate so went to commode and passed a large amount of red material. Could not see the commode bottom. Denies any abdominal pain and further symptoms. Never had anything like this before. Denies any family history of colorectal cancer.  He is on 81gm ASA, no other anticoagulation. States he did pass another moderate to large amount of bloody material about 15-14mago. He is hemodynamically stable. Denies any straining.  States he does have anaphylaxis to shellfish. Note he also has a listed iodine allergy  PREVIOUS ENDOSCOPIES:            Colonoscopy 2014 or so- reports unremarkable   Past Medical History:  Diagnosis Date   Actinic keratosis    [place] ([place] 10/06/2017   a.) TTE 10/06/2017: Ao root 469m   Arthritis    Basal cell carcinoma 08/21/2009   Left neck. Excised: 10/03/2009, margins free.   Coronary artery disease 10/06/2017   a.) LHC 10/06/2017: 60% D2, 100% 2nd RPLB, 60% 3rd RPLB, 40% pLAD, 40% mLAD, 60% OM1; Rx mgmt.   Diastolic dysfunction 100000000 a.) TTE 10/06/2017: EF 55-60%; G1DD.   Dysplastic nevus 08/23/2008   Right vertex. Moderate to severe atypia, bordering on early evolving MMis.   Dysplastic nevus 02/21/2010   Right vertex. Moderate to severe atypia, bordering on early evolving MMis.   GERD  (gastroesophageal reflux disease)    History of acoustic neuroma 02/2004   a.) s/p crainotomy for removal --> complicated by CSF leak requiring 2nd crainotomy for internatal auditory canal closure, closure or dura, and placement of lumbar drain on 06/13/2004   History of kidney stones    HLD (hyperlipidemia)    Hypertension    Loss of equilibrium    left side only due to benign brain stem tumor in 2005   N[place]I (non-ST elevated [place] infarction) [place]   a.) [place]: 60% D2, 100% 2nd RPLB, 60% 3rd RPLB, 40% pLAD, 40% mLAD, 60% OM1; intervention deferred opting for medical management   OSA (obstructive sleep apnea)    a.) does not require nocturnal PAP therapy   [place]   Rheumatic fever    Squamous cell carcinoma of skin 07/27/2018   [facility]  Past Surgical History:  Procedure Laterality Date   BLADDER SURGERY  12/21/2010   Dr. Ha[place]adelin Headings[place] 02/2004   Procedure: CRANIOTOMY FOR ACOUSTIC NEUROMA REMOVAL; Location: [facility]   CRANIOTOMY FOR REPAIR DURAL / CSF LEAK N/A 06/13/2004   Procedure: SECONDARY SUBOCCIPITAL CRANIOTOMY WITH CLOSURE OF INTERNAL AUDITORY CANAL, CLOSURE OF DURA, AND PLACEMENT OF LUMBAR DRAIN; Location: [facility]   CYSTOSCOPY WITH RETROGRADE URETHROGRAM  03/16/2016   Procedure: CYSTOSCOPY WITH RETROGRADE  URETHROGRAM;  Surgeon: Hollice Espy, MD;  Location: [facility];  Service: Urology;;   CYSTOSCOPY WITH STENT PLACEMENT Left 02/23/2016   Procedure: CYSTOSCOPY WITH STENT PLACEMENT;  Surgeon: Dereck Leep, MD;  Location: [facility];  Service: Urology;  Laterality: Left;   CYSTOSCOPY WITH STENT PLACEMENT Left 03/16/2016   Procedure: CYSTOSCOPY WITH STENT PLACEMENT/EXCHANGE;  Surgeon: Hollice Espy, MD;  Location: [facility];  Service: Urology;  Laterality: Left;   LEFT HEART CATH AND CORONARY ANGIOGRAPHY N/A 10/06/2017   Procedure: LEFT HEART CATH AND CORONARY ANGIOGRAPHY;   Surgeon: Minna Merritts, MD;  Location: [place] [facility];  Service: Cardiovascular;  Laterality: N/A;   MELANOMA EXCISION     PROSTATECTOMY  12/21/2001   Dr. Madelin Headings, [place] ARTHROSCOPY WITH SUBACROMIAL DECOMPRESSION, ROTATOR CUFF REPAIR AND BICEP TENDON REPAIR Left 02/05/2022   Procedure: SHOULDER ARTHROSCOPY WITH EXTENSIVE DEBRIDEMENT, DECOMPRESSION,;  Surgeon: Corky Mull, MD;  Location: [facility];  Service: Orthopedics;  Laterality: Left;   STONE EXTRACTION WITH BASKET  03/16/2016   Procedure: STONE EXTRACTION WITH BASKET;  Surgeon: Hollice Espy, MD;  Location: [facility];  Service: Urology;;    Family History  Problem Relation Age of Onset   CAD Father    Polycystic kidney disease Father    Diabetes Mellitus II Neg Hx    Hypertension Neg Hx      Social History   Tobacco Use   Smoking status: Never   Smokeless tobacco: Never  Vaping Use   Vaping Use: Never used  Substance Use Topics   Alcohol use: No   Drug use: No    Prior to Admission medications   Medication Sig Start Date End Date Taking? Authorizing Provider  amLODipine (NORVASC) 10 MG tablet Take 1 tablet (10 mg total) by mouth in the morning. 10/22/22  Yes Minna Merritts, MD  aspirin EC 81 MG EC tablet Take 1 tablet (81 mg total) by mouth daily. 10/07/17  Yes Hillary Bow, MD  atenolol (TENORMIN) 25 MG tablet Take 1 tablet (25 mg total) by mouth every evening. TAKE 1 TABLET EVERY DAY 10/22/22  Yes Gollan, Kathlene November, MD  atorvastatin (LIPITOR) 40 MG tablet TAKE 1 TABLET EVERY DAY 10/22/22  Yes Minna Merritts, MD  fesoterodine (TOVIAZ) 8 MG TB24 tablet TAKE 1 TABLET EVERY DAY 12/17/22  Yes Hollice Espy, MD  hydrochlorothiazide (HYDRODIURIL) 25 MG tablet Take 1 tablet (25 mg total) by mouth 3 (three) times a week. Patient taking differently: Take 25 mg by mouth every other day. 10/29/21 03/04/23 Yes Gollan, Kathlene November, MD  latanoprost (XALATAN) 0.005 % ophthalmic solution Place 1 drop into both eyes  at bedtime. 12/29/22  Yes [provider]  meclizine (ANTIVERT) 25 MG tablet Take 1 tablet (25 mg total) by mouth 3 (three) times daily as needed for dizziness. 10/07/22  Yes Blake Divine, MD  nitroGLYCERIN (NITROSTAT) 0.4 MG SL tablet Place 1 tablet (0.4 mg total) under the tongue every 5 (five) minutes as needed for chest pain (hold for blood pressure less than 90). 10/07/17  Yes Sudini, Alveta Heimlich, MD  olmesartan (BENICAR) 40 MG tablet Take 1 tablet (40 mg total) by mouth every evening. 10/22/22  Yes Minna Merritts, MD    Current Facility-Administered Medications  Medication Dose Route Frequency Provider Last Rate Last Admin   0.9 %  sodium chloride infusion   Intravenous Continuous Deneise Lever, MD 75 mL/hr at 03/04/23 1217 New Bag at 03/04/23 1217   amLODipine (NORVASC) tablet 10  mg  10 mg Oral Daily Deneise Lever, MD       atenolol (TENORMIN) tablet 25 mg  25 mg Oral QPM Deneise Lever, MD       fesoterodine (TOVIAZ) tablet 8 mg  8 mg Oral Daily Deneise Lever, MD       nitroGLYCERIN (NITROSTAT) SL tablet 0.4 mg  0.4 mg Sublingual Q5 min PRN Deneise Lever, MD       ondansetron [facility]) tablet 4 mg  4 mg Oral Q6H PRN Deneise Lever, MD       Or   ondansetron [facility]) injection 4 mg  4 mg Intravenous Q6H PRN Deneise Lever, MD        Allergies as of 03/04/2023 - Review Complete 03/04/2023  Allergen Reaction Noted   Iodine Anaphylaxis 02/22/2016   Shellfish allergy Anaphylaxis 06/05/2016     Review of Systems:    All systems reviewed and negative except where noted in HPI.      Physical Exam:  Vital signs in last 24 hours: Temp:  [97.7 F (36.5 C)] 97.7 F (36.5 C) (03/14 1143) Pulse Rate:  [54-63] 63 (03/14 1143) Resp:  [11-19] 18 (03/14 1143) BP: (133-145)/(60-80) 142/60 (03/14 1143) SpO2:  [95 %-98 %] 97 % (03/14 1143) Weight:  [91.7 kg] 91.7 kg (03/14 0742)   General:   Pleasant man in NAD Head:  Normocephalic and atraumatic. Eyes:   No  icterus.   Conjunctiva pink. Ears:  Normal auditory acuity. Mouth: Mucosa pink moist, no lesions. Neck:  Supple; no masses felt Lungs:  Respirations even and unlabored. Lungs clear to auscultation bilaterally.   No wheezes, crackles, or rhonchi.  Heart:  S1S2, RRR, no MRG. No edema. Abdomen:   Flat, soft, nondistended, nontender. Normal bowel sounds. No appreciable masses or hepatomegaly. No rebound signs or other peritoneal signs. Rectal:  Not performed.  Msk:  MAEW x4, No clubbing or cyanosis. Strength 5/5. Symmetrical without gross deformities. Neurologic:  Alert and  oriented x4;  Cranial nerves II-XII intact.  Skin:  Warm, dry, pink without significant lesions or rashes. Psych:  Alert and cooperative. Normal affect.  LAB RESULTS: Recent Labs    03/04/23 0751 03/04/23 1150  WBC 9.1  --   HGB 15.5 15.4  HCT 46.2  --   PLT 257  --    BMET Recent Labs    03/04/23 0751  NA 137  K 3.5  CL 107  CO2 25  GLUCOSE 109*  BUN 18  CREATININE 1.10  CALCIUM 8.8*   LFT Recent Labs    03/04/23 0751  PROT 7.0  ALBUMIN 3.8  AST 24  ALT 25  ALKPHOS 65  BILITOT 1.1   PT/INR Recent Labs    03/04/23 0751  LABPROT 14.1  INR 1.1    STUDIES: No results found.     Impression / Plan:   Rectal bleeding- favoing diverticular- given recent episode will order ct angiogram of abdomen/pelvis - his shellfish/iodine allergy were noted and discussed- the iodine allergy may not be actual as patient states he has never had any iodinated tests in past. However we will premedicate with '40mg'$  solumedrol 5h prior to test and then again at one hr prior to test. He will also receive '50mg'$  iv diphenhydramine 1h prior to test as well. If active bleeding found will consider vascular consult. If no active bleeding source found will consider colonoscopy. Agree with following hemoglobins and transfusing prn to keep hgb >7  Thank you  very much for this consult. These services were provided by  Stephens November, NP-C, in collaboration with Lesly Rubenstein MD, with whom I have discussed this patient in full.   Stephens November, NP-C

## 2023-03-05 ENCOUNTER — Observation Stay: Payer: Medicare HMO | Admitting: Anesthesiology

## 2023-03-05 ENCOUNTER — Encounter
Admission: EM | Disposition: A | Discharge status: Home / Self Care | Payer: Self-pay | Source: Home / Self Care | Attending: Emergency Medicine

## 2023-03-05 ENCOUNTER — Encounter: Payer: Self-pay | Admitting: Family Medicine

## 2023-03-05 DIAGNOSIS — I2583 Coronary atherosclerosis due to lipid rich plaque: Secondary | ICD-10-CM

## 2023-03-05 DIAGNOSIS — I10 Essential (primary) hypertension: Secondary | ICD-10-CM | POA: Diagnosis not present

## 2023-03-05 DIAGNOSIS — K922 Gastrointestinal hemorrhage, unspecified: Secondary | ICD-10-CM | POA: Diagnosis not present

## 2023-03-05 DIAGNOSIS — I2510 Atherosclerotic heart disease of native coronary artery without angina pectoris: Secondary | ICD-10-CM | POA: Diagnosis not present

## 2023-03-05 DIAGNOSIS — K640 First degree hemorrhoids: Secondary | ICD-10-CM | POA: Diagnosis not present

## 2023-03-05 DIAGNOSIS — E785 Hyperlipidemia, unspecified: Secondary | ICD-10-CM | POA: Diagnosis not present

## 2023-03-05 DIAGNOSIS — K5790 Diverticulosis of intestine, part unspecified, without perforation or abscess without bleeding: Secondary | ICD-10-CM | POA: Diagnosis not present

## 2023-03-05 DIAGNOSIS — K5730 Diverticulosis of large intestine without perforation or abscess without bleeding: Secondary | ICD-10-CM | POA: Diagnosis not present

## 2023-03-05 DIAGNOSIS — K635 Polyp of colon: Secondary | ICD-10-CM | POA: Diagnosis not present

## 2023-03-05 DIAGNOSIS — I5189 Other ill-defined heart diseases: Secondary | ICD-10-CM | POA: Diagnosis not present

## 2023-03-05 DIAGNOSIS — D122 Benign neoplasm of ascending colon: Secondary | ICD-10-CM | POA: Diagnosis not present

## 2023-03-05 DIAGNOSIS — D126 Benign neoplasm of colon, unspecified: Secondary | ICD-10-CM | POA: Diagnosis not present

## 2023-03-05 HISTORY — PX: COLONOSCOPY WITH PROPOFOL: SHX5780

## 2023-03-05 LAB — HEMOGLOBIN
Hemoglobin: 12.2 g/dL — ABNORMAL LOW (ref 13.0–17.0)
Hemoglobin: 11.8 g/dL — ABNORMAL LOW (ref 13.0–17.0)

## 2023-03-05 LAB — COMPREHENSIVE METABOLIC PANEL
ALT: 26 U/L (ref 0–44)
AST: 26 U/L (ref 15–41)
Albumin: 3.4 g/dL — ABNORMAL LOW (ref 3.5–5.0)
Alkaline Phosphatase: 57 U/L (ref 38–126)
Anion gap: 11 (ref 5–15)
BUN: 23 mg/dL (ref 8–23)
CO2: 23 mmol/L (ref 22–32)
Calcium: 8.6 mg/dL — ABNORMAL LOW (ref 8.9–10.3)
Chloride: 106 mmol/L (ref 98–111)
Creatinine, Ser: 1.11 mg/dL (ref 0.61–1.24)
GFR, Estimated: >60 mL/min (ref >60)
Glucose, Bld: 167 mg/dL — ABNORMAL HIGH (ref 70–99)
Potassium: 3.7 mmol/L (ref 3.5–5.1)
Sodium: 140 mmol/L (ref 135–145)
Total Bilirubin: 0.9 mg/dL (ref 0.3–1.2)
Total Protein: 6.2 g/dL — ABNORMAL LOW (ref 6.5–8.1)

## 2023-03-05 LAB — CBC
HCT: 37.8 % — ABNORMAL LOW (ref 39.0–52.0)
Hemoglobin: 13.1 g/dL (ref 13.0–17.0)
MCH: 30.9 pg (ref 26.0–34.0)
MCHC: 34.7 g/dL (ref 30.0–36.0)
MCV: 89.2 fL (ref 80.0–100.0)
Platelets: 290 10*3/uL (ref 150–400)
RBC: 4.24 MIL/uL (ref 4.22–5.81)
RDW: 14.1 % (ref 11.5–15.5)
WBC: 15.1 10*3/uL — ABNORMAL HIGH (ref 4.0–10.5)
nRBC: 0 % (ref 0.0–0.2)

## 2023-03-05 SURGERY — COLONOSCOPY WITH PROPOFOL
Anesthesia: General

## 2023-03-05 MED ORDER — PROPOFOL 10 MG/ML IV BOLUS
INTRAVENOUS | Status: DC | PRN
  Administered 2023-03-05: 90 mg via INTRAVENOUS
  Administered 2023-03-05: 110 ug/kg/min via INTRAVENOUS

## 2023-03-05 MED ORDER — PHENYLEPHRINE HCL (PRESSORS) 10 MG/ML IV SOLN
INTRAVENOUS | Status: DC | PRN
  Administered 2023-03-05 (×3): 80 ug via INTRAVENOUS

## 2023-03-05 MED ORDER — LIDOCAINE HCL (CARDIAC) PF 100 MG/5ML IV SOSY
PREFILLED_SYRINGE | INTRAVENOUS | Status: DC | PRN
  Administered 2023-03-05: 40 mg via INTRAVENOUS

## 2023-03-05 MED ORDER — PHENYLEPHRINE 80 MCG/ML (10ML) SYRINGE FOR IV PUSH (FOR BLOOD PRESSURE SUPPORT)
PREFILLED_SYRINGE | INTRAVENOUS | Status: AC
  Filled 2023-03-05: qty 10

## 2023-03-05 MED ORDER — GLYCOPYRROLATE 0.2 MG/ML IJ SOLN
INTRAMUSCULAR | Status: DC | PRN
  Administered 2023-03-05: .2 mg via INTRAVENOUS

## 2023-03-05 MED ORDER — EPHEDRINE 5 MG/ML INJ
INTRAVENOUS | Status: AC
  Filled 2023-03-05: qty 5

## 2023-03-05 MED ORDER — STERILE WATER FOR IRRIGATION IR SOLN
Status: DC | PRN
  Administered 2023-03-05 (×3): 60 mL

## 2023-03-05 NOTE — Progress Notes (Signed)
  Progress Note   Patient: Gregory [name] [id number] DOB: 10-11-1941 DOA: 03/04/2023     0 DOS: the patient was seen and examined on 03/05/2023   Brief hospital course: 82 y.o. male with medical history significant of coronary artery disease, diastolic dysfunction, GERD, hyperlipidemia, hypertension presenting with GI bleeding.  Patient reports having 1 very large episode of bright red blood per rectum earlier today after getting up to use the bathroom.  Patient denies any prior episodes like this in the past.  No abdominal pain.  No fevers or chills.  No nausea or vomiting.  Patient reports a remote history of hemorrhoids in the past.  Had a colonoscopy around 2015.  Currently takes a baby aspirin daily.  No reported high-dose NSAID use.  No reported strenuous activity.  No hemiparesis or confusion. Presented to the ER afebrile, hemodynamically stable  Assessment and Plan: * Acute lower GI bleeding Suspect diverticular in nature.  CT scan did not show any evidence of bleeding.  Colonoscopy did not show any active bleeding.  Hemoglobin dropped from 15.5 down to 12.2.  Will check another hemoglobin and check orthostatic vital signs.  Will advance diet.  Essential hypertension Currently on atenolol and Norvasc.  Coronary artery disease Hold aspirin  HLD (hyperlipidemia) On Lipitor  Diastolic dysfunction No signs of heart failure.  Discontinue IV fluids and advance diet        Subjective: Patient seen this morning.  He was feeling okay.  With the colonoscopy prep he did see quite a bit of blood but the last bowel movement started to clear up.  Came in with GI bleed.  Physical Exam: Vitals:   03/05/23 0307 03/05/23 0807 03/05/23 1510 03/05/23 1606  BP: 110/69 121/63 128/80 (!) 113/52  Pulse: 88 89 92   Resp:  18 17 20   Temp: 98 F (36.7 C) 97.9 F (36.6 C) (!) 96.9 F (36.1 C) 98.6 F (37 C)  TempSrc:   Temporal Temporal  SpO2: 93% 94% 98%   Weight:      Height:        Physical Exam HENT:     Head: Normocephalic.     Mouth/Throat:     Pharynx: No oropharyngeal exudate.  Eyes:     General: Lids are normal.     Conjunctiva/sclera: Conjunctivae normal.  Cardiovascular:     Rate and Rhythm: Normal rate and regular rhythm.     Heart sounds: Normal heart sounds, S1 normal and S2 normal.  Pulmonary:     Breath sounds: No decreased breath sounds, wheezing, rhonchi or rales.  Abdominal:     Palpations: Abdomen is soft.     Tenderness: There is no abdominal tenderness.  Musculoskeletal:     Right lower leg: No swelling.     Left lower leg: No swelling.  Skin:    General: Skin is warm.     Findings: No rash.  Neurological:     Mental Status: He is alert and oriented to person, place, and time.     Data Reviewed: Creatinine 1.11, last hemoglobin 12.2  Disposition: Status is: Observation Will reassess and see how he does after colonoscopy.  Will check another hemoglobin and orthostatics.  Will advance diet.  Disposition either later today versus tomorrow.  Planned Discharge Destination: Home    Time spent: 30 minutes  Author: Loletha Grayer, MD 03/05/2023 4:36 PM  For on call review [email or web address].

## 2023-03-05 NOTE — Assessment & Plan Note (Signed)
Hold aspirin

## 2023-03-05 NOTE — Transfer of Care (Signed)
Immediate Anesthesia Transfer of Care Note  Patient: Gregory [name]  Procedure(s) Performed: COLONOSCOPY WITH PROPOFOL  Patient Location: PACU  Anesthesia Type:General  Level of Consciousness: awake, alert , and oriented  Airway & Oxygen Therapy: Patient Spontanous Breathing  Post-op Assessment: Report given to RN and Post -op Vital signs reviewed and stable  Post vital signs: Reviewed and stable  Last Vitals:  Vitals Value Taken Time  BP 113/52 03/05/23 1609  Temp 37 C 03/05/23 1606  Pulse 90 03/05/23 1610  Resp 17 03/05/23 1610  SpO2 97 % 03/05/23 1610  Vitals shown include unvalidated device data.  Last Pain:  Vitals:   03/05/23 1606  TempSrc: Temporal  PainSc: Asleep         Complications: No notable events documented.

## 2023-03-05 NOTE — Hospital Course (Signed)
82 y.o. male with medical history significant of coronary artery disease, diastolic dysfunction, GERD, hyperlipidemia, hypertension presenting with GI bleeding.  Patient reports having 1 very large episode of bright red blood per rectum earlier today after getting up to use the bathroom.  Patient denies any prior episodes like this in the past.  No abdominal pain.  No fevers or chills.  No nausea or vomiting.  Patient reports a remote history of hemorrhoids in the past.  Had a colonoscopy around 2015.  Currently takes a baby aspirin daily.  No reported high-dose NSAID use.  No reported strenuous activity.  No hemiparesis or confusion. Presented to the ER afebrile, hemodynamically stable.  Colonoscopy showed no active bleeding.  Polyp removed.  As per GI okay to discharge home.  Likely diverticular bleed which stopped.  Recommended no aspirin for 1 week.

## 2023-03-05 NOTE — Op Note (Signed)
[facility] Gastroenterology Patient Name: Gregory [name] Procedure Date: 03/05/2023 3:22 PM MRN: [id number] Account #: [id number] Date of Birth: Apr 06, 1941 Admit Type: Outpatient Age: 82 Room: [facility] ENDO ROOM 3 Gender: Male Note Status: Finalized Instrument Name: Jasper Riling [id number] Procedure:             Colonoscopy Indications:           Hematochezia Providers:             Andrey Farmer MD, MD Medicines:             Monitored Anesthesia Care Complications:         No immediate complications. Procedure:             Pre-Anesthesia Assessment:                        - Prior to the procedure, a History and Physical was                         performed, and patient medications and allergies were                         reviewed. The patient is competent. The risks and                         benefits of the procedure and the sedation options and                         risks were discussed with the patient. All questions                         were answered and informed consent was obtained.                         Patient identification and proposed procedure were                         verified by the physician, the nurse, the                         anesthesiologist, the anesthetist and the technician                         in the endoscopy suite. Mental Status Examination:                         alert and oriented. Airway Examination: normal                         oropharyngeal airway and neck mobility. Respiratory                         Examination: clear to auscultation. CV Examination:                         normal. Prophylactic Antibiotics: The patient does not                         require prophylactic antibiotics. Prior  Anticoagulants: The patient has taken no anticoagulant                         or antiplatelet agents. ASA Grade Assessment: III - A                         patient with severe systemic disease. After  reviewing                         the risks and benefits, the patient was deemed in                         satisfactory condition to undergo the procedure. The                         anesthesia plan was to use monitored anesthesia care                         (MAC). Immediately prior to administration of                         medications, the patient was re-assessed for adequacy                         to receive sedatives. The heart rate, respiratory                         rate, oxygen saturations, blood pressure, adequacy of                         pulmonary ventilation, and response to care were                         monitored throughout the procedure. The physical                         status of the patient was re-assessed after the                         procedure.                        After obtaining informed consent, the colonoscope was                         passed under direct vision. Throughout the procedure,                         the patient's blood pressure, pulse, and oxygen                         saturations were monitored continuously. The                         Colonoscope was introduced through the anus and                         advanced to the the terminal ileum. The colonoscopy  was performed without difficulty. The patient                         tolerated the procedure well. The quality of the bowel                         preparation was good. The terminal ileum, ileocecal                         valve, appendiceal orifice, and rectum were                         photographed. Findings:      The perianal and digital rectal examinations were normal.      The terminal ileum appeared normal.      A 10 mm polyp was found in the ascending colon. The polyp was       pedunculated. The polyp was removed with a hot snare. Resection and       retrieval were complete. To prevent bleeding post-intervention, one       hemostatic clip was  successfully placed. There was no bleeding during,       or at the end, of the procedure.      Multiple small-mouthed diverticula were found in the sigmoid colon.      Internal hemorrhoids were found during retroflexion. The hemorrhoids       were Grade I (internal hemorrhoids that do not prolapse).      The exam was otherwise without abnormality on direct and retroflexion       views. Impression:            - The examined portion of the ileum was normal.                        - One 10 mm polyp in the ascending colon, removed with                         a hot snare. Resected and retrieved. Clip was placed.                        - Diverticulosis in the sigmoid colon.                        - Internal hemorrhoids.                        - The examination was otherwise normal on direct and                         retroflexion views. Recommendation:        - Return patient to hospital ward for possible                         discharge same day.                        - Resume previous diet.                        - Continue present medications.                        -  Await pathology results. Procedure Code(s):     --- Professional ---                        [phone], Colonoscopy, flexible; with removal of                         tumor(s), polyp(s), or other lesion(s) by snare                         technique Diagnosis Code(s):     --- Professional ---                        K64.0, First degree hemorrhoids                        D12.2, Benign neoplasm of ascending colon                        K92.1, Melena (includes Hematochezia)                        K57.30, Diverticulosis of large intestine without                         perforation or abscess without bleeding CPT copyright 2022 [employer]. All rights reserved. The codes documented in this report are preliminary and upon coder review may  be revised to meet current compliance requirements. Andrey Farmer MD,  MD 03/05/2023 4:05:43 PM Number of Addenda: 0 Note Initiated On: 03/05/2023 3:22 PM Scope Withdrawal Time: 0 hours 11 minutes 43 seconds  Total Procedure Duration: 0 hours 16 minutes 15 seconds  Estimated Blood Loss:  Estimated blood loss: none.      [facility]

## 2023-03-05 NOTE — Assessment & Plan Note (Signed)
On Lipitor 

## 2023-03-05 NOTE — Anesthesia Preprocedure Evaluation (Signed)
Anesthesia Evaluation  Patient identified by MRN, date of birth, ID band Patient awake    Reviewed: Allergy & Precautions, NPO status , Patient's Chart, lab work & pertinent test results  History of Anesthesia Complications Negative for: history of anesthetic complications  Airway Mallampati: II  TM Distance: >3 FB Neck ROM: Full    Dental  (+) Chipped, Poor Dentition, Caps, Dental Advidsory Given   Pulmonary neg shortness of breath, sleep apnea , neg COPD, neg recent URI, Patient abstained from smoking.Not current smoker   Pulmonary exam normal breath sounds clear to auscultation       Cardiovascular Exercise Tolerance: Good METShypertension, Pt. on medications (-) angina + CAD and + Past MI  (-) dysrhythmias (-) Valvular Problems/Murmurs Rhythm:Regular Rate:Normal - Systolic murmurs ECHO:Left ventricle: The cavity size was normal. There was moderate asymmetric hypertrophy of the septum. Systolic function was normal. The estimated ejection fraction was in the range of 55% to 60%. Wall motion was normal; there were no regional wall motion abnormalities. Doppler parameters are consistent with abnormal left ventricular relaxation (grade 1 diastolic dysfunction). - Aorta: Aortic root dimension: 41 mm (ED). - Aortic root: The aortic root was mildly dilated. - Left atrium: The atrium was mildly dilated.  CATH:  2nd Diag lesion, 60 %stenosed.  2nd RPLB lesion, 100 %stenosed.  3rd RPLB lesion, 60 %stenosed.  Prox LAD lesion, 40 %stenosed.  Mid LAD lesion, 40 %stenosed.  1st Mrg lesion, 60 %stenosed.    Neuro/Psych negative neurological ROS  negative psych ROS   GI/Hepatic ,GERD  Controlled,,(+)     (-) substance abuse    Endo/Other  neg diabetes    Renal/GU Renal disease (kidney stones)     Musculoskeletal   Abdominal   Peds  Hematology   Anesthesia Other Findings Past Medical History: No date:  Actinic keratosis 10/06/2017: Aortic root dilatation (HCC)     Comment:  a.) TTE 10/06/2017: Ao root 9mm. No date: Arthritis 08/21/2009: Basal cell carcinoma     Comment:  Left neck. Excised: 10/03/2009, margins free. 10/06/2017: Coronary artery disease     Comment:  a.) LHC 10/06/2017: 60% D2, 100% 2nd RPLB, 60% 3rd RPLB,              40% pLAD, 40% mLAD, 60% OM1; Rx mgmt. 0000000: Diastolic dysfunction     Comment:  a.) TTE 10/06/2017: EF 55-60%; G1DD. 08/23/2008: Dysplastic nevus     Comment:  Right vertex. Moderate to severe atypia, bordering on               early evolving MMis. 02/21/2010: Dysplastic nevus     Comment:  Right vertex. Moderate to severe atypia, bordering on               early evolving MMis. 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 WD SCC. EDC  Reproductive/Obstetrics                                                              Anesthesia Evaluation  Patient identified by  MRN, date of birth, ID band Patient awake    Reviewed: Allergy & Precautions, H&P , NPO status , Patient's Chart, lab work & pertinent test results, reviewed documented beta blocker date and time   History of Anesthesia Complications Negative for: history of anesthetic complications  Airway Mallampati: III  TM Distance: >3 FB Neck ROM: limited    Dental  (+) Poor Dentition, Chipped   Pulmonary neg pulmonary ROS, neg shortness of breath,    Pulmonary exam normal breath sounds clear to auscultation       Cardiovascular Exercise Tolerance: Good hypertension, Pt. on medications and Pt. 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 negative neurological ROS  negative psych ROS   GI/Hepatic negative GI ROS, Neg liver ROS, neg GERD  ,  Endo/Other  negative endocrine ROS  Renal/GU Renal diseasenegative Renal ROSstones Bladder dysfunction  Hx of bladder surgery negative genitourinary   Musculoskeletal negative musculoskeletal ROS (+)   Abdominal   Peds negative pediatric ROS (+)  Hematology negative hematology ROS (+)   Anesthesia Other Findings CA prostate  Reproductive/Obstetrics negative OB ROS                             Anesthesia Physical  Anesthesia Plan  ASA: III  Anesthesia Plan: General   Post-op Pain Management:    Induction: Intravenous  Airway Management Planned: Oral ETT and LMA  Additional Equipment:   Intra-op Plan:   Post-operative Plan: Extubation in OR  Informed Consent: I have reviewed the patients History and Physical, chart, labs and discussed the procedure including the risks, benefits and alternatives for the proposed anesthesia with the patient or authorized representative who has indicated his/her understanding and acceptance.   Dental advisory given  Plan  Discussed with: CRNA and Surgeon  Anesthesia Plan Comments:        Anesthesia Quick Evaluation  Anesthesia Physical Anesthesia Plan  ASA: 3  Anesthesia Plan: General   Post-op Pain Management: Minimal or no pain anticipated   Induction: Intravenous  PONV Risk Score and Plan: 2 and Treatment may vary due to age or medical condition, Propofol infusion and TIVA  Airway Management Planned: Natural Airway and Nasal Cannula  Additional Equipment: None  Intra-op Plan:   Post-operative Plan:   Informed  Consent: I have reviewed the patients History and Physical, chart, labs and discussed the procedure including the risks, benefits and alternatives for the proposed anesthesia with the patient or authorized representative who has indicated his/her understanding and acceptance.     Dental advisory given  Plan Discussed with: CRNA and Surgeon  Anesthesia Plan Comments:         Anesthesia Quick Evaluation

## 2023-03-05 NOTE — Anesthesia Postprocedure Evaluation (Signed)
Anesthesia Post Note  Patient: Gregory [name]  Procedure(s) Performed: COLONOSCOPY WITH PROPOFOL  Patient location during evaluation: Endoscopy Anesthesia Type: General Level of consciousness: awake and alert Pain management: pain level controlled Vital Signs Assessment: post-procedure vital signs reviewed and stable Respiratory status: spontaneous breathing, nonlabored ventilation, respiratory function stable and patient connected to nasal cannula oxygen Cardiovascular status: blood pressure returned to baseline and stable Postop Assessment: no apparent nausea or vomiting Anesthetic complications: no   No notable events documented.   Last Vitals:  Vitals:   03/05/23 1510 03/05/23 1606  BP: 128/80 (!) 113/52  Pulse: 92   Resp: 17 20  Temp: (!) 36.1 C 37 C  SpO2: 98%     Last Pain:  Vitals:   03/05/23 1636  TempSrc:   PainSc: 0-No pain                 Martha Clan

## 2023-03-05 NOTE — Assessment & Plan Note (Addendum)
Suspect diverticular in nature.  CT scan did not show any evidence of bleeding.  Colonoscopy did not show any active bleeding.  Hemoglobin dropped from 15.5 down to 12.2.  Will check another hemoglobin and check orthostatic vital signs.  Will advance diet.

## 2023-03-05 NOTE — Discharge Instructions (Signed)
Hold aspirin for one week, if no further bleeding can restart

## 2023-03-05 NOTE — Plan of Care (Signed)

## 2023-03-05 NOTE — Progress Notes (Signed)
Patient is being discharged to home in a relatively stable condition. Vital signs checked and recorded, results were unrevealing. No complaints of dizziness or lightheadedness. Able to ambulate without difficulties. Peripheral venous access x2 terminated, dry dressing/gauze applied. AVS secured, discussed/went over with patient. Personal belongings returned to patient. Discharged at 2010, wife was present. Escorted out of hospital by unit staff.

## 2023-03-05 NOTE — Assessment & Plan Note (Signed)
No signs of heart failure.  Discontinued IV fluids and advance diet

## 2023-03-05 NOTE — Assessment & Plan Note (Signed)
Currently on atenolol and Norvasc.

## 2023-03-06 NOTE — Discharge Summary (Signed)
Physician Discharge Summary   Patient: Gregory [name] MRN: [id number] DOB: Jan 22, 1941  Admit date:     03/04/2023  Discharge date: 03/05/2023  Discharge Physician: Gregory [name]   PCP: Gregory Harrier, MD   Recommendations at discharge:   Follow-up PCP 5 days  Discharge Diagnoses: Principal Problem:   Acute lower GI bleeding Active Problems:   Essential hypertension   HLD (hyperlipidemia)   Coronary artery disease   Diastolic dysfunction    Hospital Course: 82 y.o. male with medical history significant of coronary artery disease, diastolic dysfunction, GERD, hyperlipidemia, hypertension presenting with GI bleeding.  Patient reports having 1 very large episode of bright red blood per rectum earlier today after getting up to use the bathroom.  Patient denies any prior episodes like this in the past.  No abdominal pain.  No fevers or chills.  No nausea or vomiting.  Patient reports a remote history of hemorrhoids in the past.  Had a colonoscopy around 2015.  Currently takes a baby aspirin daily.  No reported high-dose NSAID use.  No reported strenuous activity.  No hemiparesis or confusion. Presented to the ER afebrile, hemodynamically stable  Assessment and Plan: * Acute lower GI bleeding Suspect diverticular in nature.  CT scan did not show any evidence of bleeding.  Colonoscopy did not show any active bleeding.  Hemoglobin dropped from 15.5 down to 12.2.  Will check another hemoglobin and check orthostatic vital signs.  Will advance diet.  Essential hypertension Currently on atenolol and Norvasc.  Coronary artery disease Hold aspirin  HLD (hyperlipidemia) On Lipitor  Diastolic dysfunction No signs of heart failure.  Discontinue IV fluids and advance diet         Consultants: Gastroenterology Procedures performed: Colonoscopy Disposition: Home Diet recommendation:  Cardiac diet DISCHARGE MEDICATION: Allergies as of 03/05/2023       Reactions   Iodine  Anaphylaxis   Shellfish Allergy Anaphylaxis        Medication List     STOP taking these medications    aspirin EC 81 MG tablet   hydrochlorothiazide 25 MG tablet Commonly known as: HYDRODIURIL   meclizine 25 MG tablet Commonly known as: ANTIVERT   olmesartan 40 MG tablet Commonly known as: BENICAR       TAKE these medications    amLODipine 10 MG tablet Commonly known as: NORVASC Take 1 tablet (10 mg total) by mouth in the morning.   atenolol 25 MG tablet Commonly known as: TENORMIN Take 1 tablet (25 mg total) by mouth every evening. TAKE 1 TABLET EVERY DAY   atorvastatin 40 MG tablet Commonly known as: LIPITOR TAKE 1 TABLET EVERY DAY   fesoterodine 8 MG Tb24 tablet Commonly known as: TOVIAZ TAKE 1 TABLET EVERY DAY   latanoprost 0.005 % ophthalmic solution Commonly known as: XALATAN Place 1 drop into both eyes at bedtime.   nitroGLYCERIN 0.4 MG SL tablet Commonly known as: NITROSTAT Place 1 tablet (0.4 mg total) under the tongue every 5 (five) minutes as needed for chest pain (hold for blood pressure less than 90).        Follow-up Information     Gregory Harrier, MD Follow up in 5 day(s).   Specialty: Internal Medicine Contact information: [address] [place] [address] [phone]         Gregory Rubenstein, MD Follow up.   Specialty: Gastroenterology Why: If symptoms worsen Contact information: [place] [address] [phone]  Discharge Exam: Filed Weights   03/04/23 0742  Weight: 91.7 kg   Physical Exam HENT:     Head: Normocephalic.     Mouth/Throat:     Pharynx: No oropharyngeal exudate.  Eyes:     General: Lids are normal.     Conjunctiva/sclera: Conjunctivae normal.  Cardiovascular:     Rate and Rhythm: Normal rate and regular rhythm.     Heart sounds: Normal heart sounds, S1 normal and S2 normal.  Pulmonary:     Breath sounds: No  decreased breath sounds, wheezing, rhonchi or rales.  Abdominal:     Palpations: Abdomen is soft.     Tenderness: There is no abdominal tenderness.  Musculoskeletal:     Right lower leg: No swelling.     Left lower leg: No swelling.  Skin:    General: Skin is warm.     Findings: No rash.  Neurological:     Mental Status: He is alert and oriented to person, place, and time.      Condition at discharge: stable  The results of significant diagnostics from this hospitalization (including imaging, microbiology, ancillary and laboratory) are listed below for reference.   Imaging Studies: CT Angio Abd/Pel w/ and/or w/o  Result Date: 03/04/2023 CLINICAL DATA:  gi bleeding EXAM: CTA ABDOMEN AND PELVIS WITHOUT AND WITH CONTRAST TECHNIQUE: Multidetector CT imaging of the abdomen and pelvis was performed using the standard protocol during bolus administration of intravenous contrast. Multiplanar reconstructed images and MIPs were obtained and reviewed to evaluate the vascular anatomy. RADIATION DOSE REDUCTION: This exam was performed according to the departmental dose-optimization program which includes automated exposure control, adjustment of the mA and/or kV according to patient size and/or use of iterative reconstruction technique. CONTRAST:  195mL OMNIPAQUE IOHEXOL 350 MG/ML SOLN COMPARISON:  07/02/2022 FINDINGS: VASCULAR Aorta: Mild partially calcified atheromatous plaque. No aneurysm, dissection, or stenosis. Celiac: Mildly atheromatous, widely patent. SMA: Patent without evidence of aneurysm, dissection, vasculitis or significant stenosis. Renals: Both renal arteries are patent without evidence of aneurysm, dissection, vasculitis, fibromuscular dysplasia or significant stenosis. IMA: Patent without evidence of aneurysm, dissection, vasculitis or significant stenosis. Inflow: Mildly atheromatous, widely patent. Proximal Outflow: Mildly atheromatous, widely patent. Veins: Patent hepatic veins, portal  vein, SMV, splenic vein, bilateral renal veins. Incomplete opacification of the iliac venous system and IVC, which appear grossly unremarkable. Review of the MIP images confirms the above findings. NON-VASCULAR Lower chest: No pleural or pericardial effusion. Subpleural coarse interstitial opacities posteriorly in the lung bases. Hepatobiliary: Stable hepatic cysts. No new liver lesion. Cholecystectomy clips. No biliary ductal dilatation. Pancreas: Unremarkable. No pancreatic ductal dilatation or surrounding inflammatory changes. Spleen: Normal in size without focal abnormality. Adrenals/Urinary Tract: No adrenal mass. Multiple cortical lesions are again seen throughout both kidneys as before, some of which can be characterized as cysts; no follow-up warranted. Left nephrolithiasis, largest stone 2 mm in the lower pole. No hydronephrosis. Urinary bladder incompletely distended. Stomach/Bowel: Stomach is incompletely distended, unremarkable. Small bowel decompressed. Normal appendix. The colon is partially distended by gas and fluid, with a few distal descending and sigmoid diverticula. No regional inflammatory change. No active extravasation. Lymphatic: No abdominal or pelvic adenopathy. Reproductive: Post prostatectomy with regional vascular clips. Other: No ascites.  No free air. Musculoskeletal: Stable mild vertebral compression deformities in the lower thoracic spine. Lower lumbar facet DJD. No acute findings. IMPRESSION: 1. No acute findings. No active extravasation. 2. Descending and sigmoid diverticulosis. 3. Left nephrolithiasis without hydronephrosis. 4.  Aortic Atherosclerosis (ICD10-I70.0). Electronically  Signed   By: Lucrezia Europe M.D.   On: 03/04/2023 20:31    Microbiology: Results for orders placed or performed in visit on 07/14/22  Microscopic Examination     Status: Abnormal   Collection Time: 07/14/22  4:06 PM   Urine  Result Value Ref Range Status   WBC, UA 0-5 0 - 5 /hpf Final   RBC, Urine  0-2 0 - 2 /hpf Final   Epithelial Cells (non renal) 0-10 0 - 10 /hpf Final   Mucus, UA Present (A) Not Estab. Final   Bacteria, UA None seen None seen/Few Final    Labs: CBC: Recent Labs  Lab 03/04/23 0751 03/04/23 1150 03/04/23 1843 03/05/23 0217 03/05/23 0656 03/05/23 1652  WBC 9.1  --   --  15.1*  --   --   NEUTROABS 6.4  --   --   --   --   --   HGB 15.5 15.4 14.5 13.1 12.2* 11.8*  HCT 46.2  --   --  37.8*  --   --   MCV 89.9  --   --  89.2  --   --   PLT 257  --   --  290  --   --    Basic Metabolic Panel: Recent Labs  Lab 03/04/23 0751 03/05/23 0217  NA 137 140  K 3.5 3.7  CL 107 106  CO2 25 23  GLUCOSE 109* 167*  BUN 18 23  CREATININE 1.10 1.11  CALCIUM 8.8* 8.6*   Liver Function Tests: Recent Labs  Lab 03/04/23 0751 03/05/23 0217  AST 24 26  ALT 25 26  ALKPHOS 65 57  BILITOT 1.1 0.9  PROT 7.0 6.2*  ALBUMIN 3.8 3.4*     Discharge time spent: greater than 30 minutes.  Signed: Loletha Grayer, MD [facility] 03/06/2023

## 2023-03-08 ENCOUNTER — Encounter: Payer: Self-pay | Admitting: Cardiovascular Disease

## 2023-03-08 ENCOUNTER — Encounter: Payer: Self-pay | Admitting: Gastroenterology

## 2023-03-09 LAB — SURGICAL PATHOLOGY

## 2023-03-09 NOTE — Addendum Note (Signed)
Addendum  created 03/09/23 0904 by Doreen Salvage, CRNA   Intraprocedure Meds edited

## 2023-03-12 DIAGNOSIS — Z09 Encounter for follow-up examination after completed treatment for conditions other than malignant neoplasm: Secondary | ICD-10-CM | POA: Diagnosis not present

## 2023-03-12 DIAGNOSIS — K625 Hemorrhage of anus and rectum: Secondary | ICD-10-CM | POA: Diagnosis not present

## 2023-03-12 DIAGNOSIS — I2510 Atherosclerotic heart disease of native coronary artery without angina pectoris: Secondary | ICD-10-CM | POA: Diagnosis not present

## 2023-03-12 DIAGNOSIS — M25562 Pain in left knee: Secondary | ICD-10-CM | POA: Diagnosis not present

## 2023-03-12 DIAGNOSIS — I10 Essential (primary) hypertension: Secondary | ICD-10-CM | POA: Diagnosis not present

## 2023-03-23 ENCOUNTER — Encounter: Payer: Self-pay | Admitting: Cardiology

## 2023-03-23 ENCOUNTER — Other Ambulatory Visit
Admission: RE | Admit: 2023-03-23 | Discharge: 2023-03-23 | Disposition: A | Discharge status: Home / Self Care | Payer: Medicare HMO | Source: Ambulatory Visit | Attending: Cardiology | Admitting: Cardiology

## 2023-03-23 ENCOUNTER — Ambulatory Visit: Payer: Medicare HMO | Attending: Cardiology | Admitting: Cardiology

## 2023-03-23 VITALS — BP 130/72 | HR 78 | Ht 68.0 in | Wt 204.0 lb

## 2023-03-23 DIAGNOSIS — K922 Gastrointestinal hemorrhage, unspecified: Secondary | ICD-10-CM | POA: Insufficient documentation

## 2023-03-23 DIAGNOSIS — E782 Mixed hyperlipidemia: Secondary | ICD-10-CM

## 2023-03-23 DIAGNOSIS — I214 Non-ST elevation (NSTEMI) myocardial infarction: Secondary | ICD-10-CM | POA: Diagnosis not present

## 2023-03-23 DIAGNOSIS — I10 Essential (primary) hypertension: Secondary | ICD-10-CM | POA: Diagnosis not present

## 2023-03-23 DIAGNOSIS — I25118 Atherosclerotic heart disease of native coronary artery with other forms of angina pectoris: Secondary | ICD-10-CM | POA: Diagnosis not present

## 2023-03-23 LAB — CBC
HCT: 40.7 % (ref 39.0–52.0)
Hemoglobin: 13.6 g/dL (ref 13.0–17.0)
MCH: 30.3 pg (ref 26.0–34.0)
MCHC: 33.4 g/dL (ref 30.0–36.0)
MCV: 90.6 fL (ref 80.0–100.0)
Platelets: 286 10*3/uL (ref 150–400)
RBC: 4.49 MIL/uL (ref 4.22–5.81)
RDW: 14.4 % (ref 11.5–15.5)
WBC: 7.9 10*3/uL (ref 4.0–10.5)
nRBC: 0 % (ref 0.0–0.2)

## 2023-03-23 NOTE — Progress Notes (Signed)
Cardiology Office Note:   Date:  03/23/2023  ID:  Gregory [name], DOB 07-Aug-1941, MRN [id number]  History of Present Illness:   Gregory [name] is a 82 y.o. male with a past medical history of coronary artery disease, essential hypertension, obstructive sleep apnea, malignant neoplasm of the prostate, prior brain surgery in 2005, hyperlipidemia, morbid obesity, who is here today for follow-up of his coronary artery disease.  He underwent a left heart catheterization in October 2018 which revealed an occluded PDA with collaterals and was recommended for medical management.  Echocardiogram and revealed a normal LVEF.  Last time he was seen in clinic was 09/2022 by Dr. Rockey Situ.  He had been having complaints of dizziness from sitting to standing with blood pressure fluctuations.  He had previously gone to the emergency department for further evaluation of dizziness and vertigo symptoms had a head CT that revealed no acute intracranial findings.  He was given meclizine to take 3 times a day and his symptoms slowly improved.  His visit he did have a drop in his blood pressure but not significantly to determine he was orthostatic.  Medications were rearranged to prevent further dizziness and orthostasis in the morning.  No further testing was ordered at that time.  He presented to the [facility] emergency department 03/04/2023 with a chief complaint of rectal bleeding.  During his evaluation in the emergency department it was thought that he had a diverticular bleed versus a hemorrhoid bleed.  He remained hemodynamically stable.  GI was consulted.  He was ordered a CT angiogram of his abdomen and pelvis but had to be premedicated due to his shellfish allergy.  His hemoglobin levels were trended.  CT scan did not show any evidence of bleeding nor did his colonoscopy.  Hemoglobin had dropped from 15.5 down to 12.2.  Aspirin was subsequently placed on hold.  He returns to clinic today overall feeling well. He has  had no other instances of bleeding. His dizziness has improved and he no longer  suffers for orthostatic hypotension. Several of his medications were discontinued while he was in the hospital. He is concerned what to do if his blood pressures goes high since two of his medications have been stopped. He denies any chest pain or shortness of breath. He has already followed up with his PCP.  ROS: 10 pont review of systems is negative with the exception of what has been addressed in the HPI.  Studies Reviewed:    EKG:  no new tracings were completed today  TTE 10/06/17 Left ventricle: The cavity size was normal. There was moderate    asymmetric hypertrophy of the septum. Systolic function was    normal. The estimated ejection fraction was in the range of 55%    to 60%. Wall motion was normal; there were no regional wall    motion abnormalities. Doppler parameters are consistent with    abnormal left ventricular relaxation (grade 1 diastolic    dysfunction).  - Aorta: Aortic root dimension: 41 mm (ED).  - Aortic root: The aortic root was mildly dilated.  - Left atrium: The atrium was mildly dilated.   Risk Assessment/Calculations:              Physical Exam:   VS:  BP 130/72 (BP Location: Left Arm, Patient Position: Sitting, Cuff Size: Normal)   Pulse 78   Ht 5\' 8"  (1.727 m)   Wt 204 lb (92.5 kg)   SpO2 97%   BMI 31.02 kg/m  Wt Readings from Last 3 Encounters:  03/23/23 204 lb (92.5 kg)  03/04/23 202 lb 2.6 oz (91.7 kg)  10/19/22 202 lb 2 oz (91.7 kg)     GEN: Well nourished, well developed in no acute distress NECK: No JVD; No carotid bruits CARDIAC: RRR, no murmurs, rubs, gallops RESPIRATORY:  Clear to auscultation without rales, wheezing or rhonchi  ABDOMEN: Soft, non-tender, non-distended EXTREMITIES:  trace edema; No deformity   ASSESSMENT AND PLAN:   GI bleed which required recent hospitalization at [facility].  He was evaluated by pulm vascular and underwent a CT angio of  the abdomen pelvis which revealed minimal bleeding and subsequently underwent a colonoscopy which revealed no evidence of bleeding as well.  During his hospitalization his aspirin was placed on hold.  He has questions today on when to restart his aspirin.  He stated that he did not follow with his PCP since he has been discharged from the hospital.  He has yet to have his hemoglobin rechecked since discharge so he is being sent for a CBC today to reevaluate his hemoglobin prior to given instructions on when to restart his aspirin therapy.  Coronary artery disease of the artery with stable angina.  He continues to deny any symptoms of angina or decompensation.  His aspirin is currently placed on hold.  He is continued on atorvastatin 40 mg daily.  He does have Nitrostat 0.4 mg sublingual to take as needed but he has not required the use of any of that medication in quite some time.  No further ischemic workup needed at this time.  Essential hypertension with blood pressure today 130/72.  He states that he has his pressure has been since discharge from the hospital.  He is currently on amlodipine 10 mg daily and atenolol 25 mg a day.  During his hospitalization his Benicar and HCTZ were discontinued due to orthostatic hypotension and dizziness.  Patient is concerned that his blood pressure continues to rise what he should do with his blood pressure medications.  We did advise him if his blood pressure increases to 140/90 that he can take his Benicar 40 mg tablet half for total of 20 mg.  He has been encouraged to continue to monitor his pressure at home and keep a blood pressure log for his return appointment.  Mixed hyperlipidemia where his cholesterol previously been at goal.  He is continued on his atorvastatin 40 mg daily with no changes made today.  History of NSTEMI with occluded PDA.  He completed a year of clopidogrel prior to stopping was recommended to stay on aspirin.  Cholesterol is continued at  goal.  With recent findings of GI bleeding aspirin was stopped he has been sent for CBC today to determine where his hemoglobin lies we will reach out to GI to see when they believe it is safe to restart his aspirin therapy.  Disposition patient return to clinic to see Dr. Rockey Situ or APP in 3 months or sooner if needed.        Signed, Platon Arocho, NP

## 2023-03-23 NOTE — Patient Instructions (Signed)
Medication Instructions:  HOLD ASPIRIN until further notice. We will give further instruction after labs resulted.  *If you need a refill on your cardiac medications before your next appointment, please call your pharmacy*  Lab Work: CBC - Please go to the [facility]. You will check in at the front desk to the right as you walk into the atrium. Valet Parking is offered if needed. - No appointment needed. You may go any day between 7 am and 6 pm.  If you have labs (blood work) drawn today and your tests are completely normal, you will receive your results only by: [place] (if you have MyChart) OR A paper copy in the mail If you have any lab test that is abnormal or we need to change your treatment, we will call you to review the results.  Testing/Procedures: No testing ordered  Follow-Up: At [facility], you and your health needs are our priority.  As part of our continuing mission to provide you with exceptional heart care, we have created designated Provider Care Teams.  These Care Teams include your primary Cardiologist (physician) and Advanced Practice Providers (APPs -  Physician Assistants and Nurse Practitioners) who all work together to provide you with the care you need, when you need it.  We recommend signing up for the patient portal called "MyChart".  Sign up information is provided on this After Visit Summary.  MyChart is used to connect with patients for Virtual Visits (Telemedicine).  Patients are able to view lab/test results, encounter notes, upcoming appointments, etc.  Non-urgent messages can be sent to your provider as well.   To learn more about what you can do with MyChart, go to [email or web address].    Your next appointment:   3 month(s)  Provider:   Ida Rogue, MD

## 2023-03-24 ENCOUNTER — Other Ambulatory Visit: Payer: Self-pay | Admitting: *Deleted

## 2023-03-24 MED ORDER — ASPIRIN 81 MG PO TBEC: 81.0000 mg | DELAYED_RELEASE_TABLET | Freq: Every day | ORAL | 3 refills | Status: AC

## 2023-03-24 NOTE — Progress Notes (Signed)
Blood counts have continued to improve. Up almost 2 grams in 2 weeks. I have confirmed with Dr. Haig Prophet from GI and it is safe to go ahead and restart your aspirin 81 mg daily. Continue to monitor for any blood in your stool. If the bleeding happens again we will stop the aspirin.

## 2023-05-18 ENCOUNTER — Encounter: Payer: Self-pay | Admitting: Urology

## 2023-05-18 ENCOUNTER — Ambulatory Visit: Payer: Medicare HMO | Admitting: Urology

## 2023-05-18 VITALS — BP 128/72 | HR 88 | Ht 68.0 in | Wt 204.0 lb

## 2023-05-18 DIAGNOSIS — N3281 Overactive bladder: Secondary | ICD-10-CM

## 2023-05-18 DIAGNOSIS — N289 Disorder of kidney and ureter, unspecified: Secondary | ICD-10-CM

## 2023-05-18 DIAGNOSIS — N200 Calculus of kidney: Secondary | ICD-10-CM

## 2023-05-18 DIAGNOSIS — Z8546 Personal history of malignant neoplasm of prostate: Secondary | ICD-10-CM | POA: Diagnosis not present

## 2023-05-18 LAB — BLADDER SCAN AMB NON-IMAGING: Scan Result: 31

## 2023-05-18 NOTE — Progress Notes (Signed)
I,Amy L Pierron,acting as a scribe for Vanna Scotland, MD.,have documented all relevant documentation on the behalf of Vanna Scotland, MD,as directed by  Vanna Scotland, MD while in the presence of Vanna Scotland, MD.  05/18/2023 12:50 PM   Gregory [name] 12-10-1941 [id number]  Referring provider: Barbette Reichmann, MD [address] [facility] [place],  [place] [address]  Chief Complaint  Patient presents with   Follow-up    HPI: 82 year-old male presents today for a routine annual follow-up.  History of kidney stones Last KUB 04/2017 negative for stones  s/p L URS 02/2016 for 4 mm left midureteral stone, 3 mm nonobstructing L stone  Pre2017- 6 other previous remote stone episodes which has not required surgical intervention Stone analysis 97% calcium oxalate, 3% calcium phosphate Last cross section imaging in July of 2023 showed several small left renal calculi, unchanged.    History of benign bladder growth   TURBT in 2012, PUNLMP Previously followed by cystoscopy annually, last at time of URS (2017)   History of prostate cancer S/p open radical prostatectomy 2013 PSA 0.02 on 01/15/2023, stable   SUI/urinary urgency  He is emptying adequately with PVR of 31 mL. IPSS below  He is still on Toviaz. However, the pharmacy filled it in the generic form last month and he can tell a difference. He is having more frequency, sometimes every hour. The cost of the generic was the same as the brand name Toviaz. In July of 2023 he was treated for an acute cystitis.   Renal lesion He had a small exophytic left upper pole lesion, mildly enlarged from 2017. On 05/19/2022 he had a CT abdomen pelvis without contrast. This is more suggestive of benign etiology.      Results for orders placed or performed in visit on 05/18/23  Bladder Scan (Post Void Residual) in office  Result Value Ref Range   Scan Result 31     IPSS     Row Name 05/18/23 1000          International Prostate Symptom Score   How often have you had the sensation of not emptying your bladder? Not at All     How often have you had to urinate less than every two hours? Less than half the time     How often have you found you stopped and started again several times when you urinated? Not at All     How often have you found it difficult to postpone urination? Not at All     How often have you had a weak urinary stream? About half the time     How often have you had to strain to start urination? Not at All     How many times did you typically get up at night to urinate? 3 Times     Total IPSS Score 8       Quality of Life due to urinary symptoms   If you were to spend the rest of your life with your urinary condition just the way it is now how would you feel about that? Mostly Satisfied            Score:  1-7 Mild 8-19 Moderate 20-35 Severe  PMH: Past Medical History:  Diagnosis Date   Actinic keratosis    Aortic root dilatation (HCC) 10/06/2017   a.) TTE 10/06/2017: Ao root 41mm.   Arthritis    Basal cell carcinoma 08/21/2009   Left neck. Excised: 10/03/2009,  margins free.   Coronary artery disease 10/06/2017   a.) LHC 10/06/2017: 60% D2, 100% 2nd RPLB, 60% 3rd RPLB, 40% pLAD, 40% mLAD, 60% OM1; Rx mgmt.   Diastolic dysfunction 10/06/2017   a.) TTE 10/06/2017: EF 55-60%; G1DD.   Dysplastic nevus 08/23/2008   Right vertex. Moderate to severe atypia, bordering on early evolving MMis.   Dysplastic nevus 02/21/2010   Right vertex. Moderate to severe atypia, bordering on early evolving MMis.   GERD (gastroesophageal reflux disease)    History of acoustic neuroma 02/2004   a.) s/p crainotomy for removal --> complicated by CSF leak requiring 2nd crainotomy for internatal auditory canal closure, closure or dura, and placement of lumbar drain on 06/13/2004   History of kidney stones    HLD (hyperlipidemia)    Hypertension    Loss of equilibrium    left side only due  to benign brain stem tumor in 2005   NSTEMI (non-ST elevated myocardial infarction) (HCC) 10/06/2017   a.) LHC 10/06/2017: 60% D2, 100% 2nd RPLB, 60% 3rd RPLB, 40% pLAD, 40% mLAD, 60% OM1; intervention deferred opting for medical management   OSA (obstructive sleep apnea)    a.) does not require nocturnal PAP therapy   Prostate cancer (HCC)    Rheumatic fever    Squamous cell carcinoma of skin 07/27/2018   Right posterior ear. WD [facility]. [facility]    Surgical History: Past Surgical History:  Procedure Laterality Date   BLADDER SURGERY  12/21/2010   Dr. Leonette Monarch, [facility]   CHOLECYSTECTOMY     COLONOSCOPY WITH PROPOFOL N/A 03/05/2023   Procedure: COLONOSCOPY WITH PROPOFOL;  Surgeon: Regis Bill, MD;  Location: [facility] ENDOSCOPY;  Service: Endoscopy;  Laterality: N/A;   CRANIOTOMY Left 02/2004   Procedure: CRANIOTOMY FOR ACOUSTIC NEUROMA REMOVAL; Location: [facility]   CRANIOTOMY FOR REPAIR DURAL / CSF LEAK N/A 06/13/2004   Procedure: SECONDARY SUBOCCIPITAL CRANIOTOMY WITH CLOSURE OF INTERNAL AUDITORY CANAL, CLOSURE OF DURA, AND PLACEMENT OF LUMBAR DRAIN; Location: [facility]   CYSTOSCOPY WITH RETROGRADE URETHROGRAM  03/16/2016   Procedure: CYSTOSCOPY WITH RETROGRADE URETHROGRAM;  Surgeon: Vanna Scotland, MD;  Location: [facility] ORS;  Service: Urology;;   CYSTOSCOPY WITH STENT PLACEMENT Left 02/23/2016   Procedure: CYSTOSCOPY WITH STENT PLACEMENT;  Surgeon: Eliot Ford, MD;  Location: [facility] OR[facility];  Service: Urology;  Laterality: Left;   CYSTOSCOPY WITH STENT PLACEMENT Left 03/16/2016   Procedure: CYSTOSCOPY WITH STENT PLACEMENT/EXCHANGE;  Surgeon: Vanna Scotland, MD;  Location: [facility] OR[facility];  Service: Urology;  Laterality: Left;   LEFT HEART CATH AND CORONARY ANGIOGRAPHY N/A 10/06/2017   Procedure: LEFT HEART CATH AND CORONARY ANGIOGRAPHY;  Surgeon: Antonieta Iba, MD;  Location: [facility] INVASIVE [facility];  Service: Cardiovascular;  Laterality: N/A;   MELANOMA EXCISION     PROSTATECTOMY  12/21/2001   Dr.  Leonette Monarch, [facility]   SHOULDER ARTHROSCOPY WITH SUBACROMIAL DECOMPRESSION, ROTATOR CUFF REPAIR AND BICEP TENDON REPAIR Left 02/05/2022   Procedure: SHOULDER ARTHROSCOPY WITH EXTENSIVE DEBRIDEMENT, DECOMPRESSION,;  Surgeon: Christena Flake, MD;  Location: [facility] OR[facility];  Service: Orthopedics;  Laterality: Left;   STONE EXTRACTION WITH BASKET  03/16/2016   Procedure: STONE EXTRACTION WITH BASKET;  Surgeon: Vanna Scotland, MD;  Location: [facility] OR[facility];  Service: Urology;;    Home Medications:  Allergies as of 05/18/2023       Reactions   Iodine Anaphylaxis   Shellfish Allergy Anaphylaxis        Medication List        Accurate as of May 18, 2023 12:50 PM.  If you have any questions, ask your nurse or doctor.          amLODipine 10 MG tablet Commonly known as: NORVASC Take 1 tablet (10 mg total) by mouth in the morning.   aspirin EC 81 MG tablet Take 1 tablet (81 mg total) by mouth daily. Swallow whole.   atenolol 25 MG tablet Commonly known as: TENORMIN Take 1 tablet (25 mg total) by mouth every evening. TAKE 1 TABLET EVERY DAY   atorvastatin 40 MG tablet Commonly known as: LIPITOR TAKE 1 TABLET EVERY DAY   diclofenac Sodium 1 % Gel Commonly known as: VOLTAREN Apply 2 g topically 4 (four) times daily.   fesoterodine 8 MG Tb24 tablet Commonly known as: TOVIAZ TAKE 1 TABLET EVERY DAY   latanoprost 0.005 % ophthalmic solution Commonly known as: XALATAN Place 1 drop into both eyes at bedtime.   nitroGLYCERIN 0.4 MG SL tablet Commonly known as: NITROSTAT Place 1 tablet (0.4 mg total) under the tongue every 5 (five) minutes as needed for chest pain (hold for blood pressure less than 90).        Allergies:  Allergies  Allergen Reactions   Iodine Anaphylaxis   Shellfish Allergy Anaphylaxis    Family History: Family History  Problem Relation Age of Onset   CAD Father    Polycystic kidney disease Father    Diabetes Mellitus II Neg Hx    Hypertension Neg Hx     Social  History:  reports that he has never smoked. He has never used smokeless tobacco. He reports that he does not drink alcohol and does not use drugs.   Physical Exam: BP 128/72   Pulse 88   Ht 5\' 8"  (1.727 m)   Wt 204 lb (92.5 kg)   BMI 31.02 kg/m   Constitutional:  Alert and oriented, No acute distress. HEENT: [place] AT, moist mucus membranes.  Trachea midline, no masses. Neurologic: Grossly intact, no focal deficits, moving all 4 extremities. Psychiatric: Normal mood and affect.   Pertinent Imaging:  CT Renal Stone Study  Narrative CLINICAL DATA:  Flank pain.  Kidney stone suspected  EXAM: CT ABDOMEN AND PELVIS WITHOUT CONTRAST  TECHNIQUE: Multidetector CT imaging of the abdomen and pelvis was performed following the standard protocol without IV contrast.  RADIATION DOSE REDUCTION: This exam was performed according to the departmental dose-optimization program which includes automated exposure control, adjustment of the mA and/or kV according to patient size and/or use of iterative reconstruction technique.  COMPARISON:  None Available.  FINDINGS: Lower chest: Lung bases are clear.  Hepatobiliary: Several low-density lesions in the liver consistent benign cysts. Postcholecystectomy.  Pancreas: Pancreas is normal. No ductal dilatation. No pancreatic inflammation.  Spleen: Normal spleen  Adrenals/urinary tract: Adrenal glands normal. Several punctate calcifications in the LEFT kidney unchanged. No ureterolithiasis or obstructive uropathy. No bladder calculi.  Exophytic from the upper pole of the LEFT kidney is a rounded lesion measuring 20 mm (image 27/2) increased from 15 mm (CT 02/22/2016). This lesion is equal density to the adjacent renal parenchyma.  Additional low-density renal cysts are not significantly changed.  Stomach/Bowel: Stomach, duodenum small-bowel normal. Appendix and cecum normal.  Near complete resolution of the inflammation along the  descending colon. Insert LEFT.  Vascular/Lymphatic: Abdominal aorta is normal caliber with atherosclerotic calcification. There is no retroperitoneal or periportal lymphadenopathy. No pelvic lymphadenopathy.  Reproductive: Post prostatectomy  Other: No free fluid.  Musculoskeletal: No aggressive osseous lesion.  IMPRESSION: 1. Several small LEFT renal calculi unchanged.  No ureterolithiasis or obstructive uropathy. 2. Exophytic soft tissue lesion extending from the upper pole of the LEFT kidney mildly enlarged from 2017. Recommend MRI without with contrast to evaluate for solid renal neoplasm.   Electronically Signed By: Genevive Bi M.D. On: 07/02/2022 08:09 Personally reviewed today and agree with radiologic interpretation.   Assessment & Plan:    Kidney stone  - Overall stone burden is stable. Asymptomatic.   - Plan for renal ultrasound in a year but to assess stone burden as well as the lesion in question  2. Left renal lesion  -  Very slow growth. Most of the studies are suggestive of benign etiology. Would recommend surveillance. Consider repeat imaging next year.   3. History of prostate cancer  - NED, PSA remains very low, does not meet the criteria for biochemical recurrence - Recommend continued annual PSA testing either here or with his PCP.   4. OAB  - Emptying well today, symptomatically controlled on Toviaz 8mg .  - Pharmacy gave him the generic formula which didn't seem to work as well. When sending the next refill will indicate not to use the generic form. He will check with his insurance to see how it is covered on his formulary and/or if it is cheaper at a different pharmacy. He will let us know on MyChart.  Return in about 1 year (around 05/17/2024) for UA, renal ultrasound, PVR, IPSS.   [facility] [address] [place], [place] [address] [phone]

## 2023-06-04 ENCOUNTER — Encounter: Payer: Self-pay | Admitting: Emergency Medicine

## 2023-06-04 ENCOUNTER — Emergency Department: Payer: Medicare HMO

## 2023-06-04 ENCOUNTER — Other Ambulatory Visit: Payer: Self-pay

## 2023-06-04 ENCOUNTER — Emergency Department
Admission: EM | Admit: 2023-06-04 | Discharge: 2023-06-04 | Disposition: A | Discharge status: Home / Self Care | Payer: Medicare HMO | Attending: Student in an Organized Health Care Education/Training Program | Admitting: Student in an Organized Health Care Education/Training Program

## 2023-06-04 DIAGNOSIS — R519 Headache, unspecified: Secondary | ICD-10-CM | POA: Insufficient documentation

## 2023-06-04 NOTE — Discharge Instructions (Signed)

## 2023-06-04 NOTE — ED Provider Notes (Signed)
[facility] Provider Note    Event Date/Time   First MD Initiated Contact with Patient 06/04/23 2128     (approximate)   History   Otalgia   HPI  Gregory [name] is a 82 y.o. male remote history of acoustic neuroma of the left presents to the ER for evaluation of 24 hours of intermittent stabbing right-sided headache.  States it will occur intermittently and without prodromal symptoms.  States he will feel like electric or stabbing-like for only a few seconds and then dissipates.  Does not have any pain between these episodes.  He is currently pain-free.  Denies any neck stiffness or pain no trauma.  No fevers.  No rash.     Physical Exam   Triage Vital Signs: ED Triage Vitals  Enc Vitals Group     BP 06/04/23 1930 (!) 157/73     Pulse Rate 06/04/23 1930 78     Resp 06/04/23 1930 18     Temp 06/04/23 1930 98.3 F (36.8 C)     Temp Source 06/04/23 1930 Oral     SpO2 06/04/23 1930 97 %     Weight 06/04/23 1931 205 lb (93 kg)     Height 06/04/23 1931 5\' 8"  (1.727 m)     Head Circumference --      Peak Flow --      Pain Score 06/04/23 1931 0     Pain Loc --      Pain Edu? --      Excl. in GC? --     Most recent vital signs: Vitals:   06/04/23 1930 06/04/23 2212  BP: (!) 157/73 (!) 141/71  Pulse: 78 74  Resp: 18 16  Temp: 98.3 F (36.8 C) 98.1 F (36.7 C)  SpO2: 97% 99%     Constitutional: Alert  Eyes: Conjunctivae are normal.  Head: Atraumatic. Nose: No congestion/rhinnorhea. Mouth/Throat: Mucous membranes are moist.   Neck: Painless ROM.  Cardiovascular:   Good peripheral circulation. Respiratory: Normal respiratory effort.  No retractions.  Gastrointestinal: Soft and nontender.  Musculoskeletal:  no deformity Neurologic:  MAE spontaneously. No gross focal neurologic deficits are appreciated.  Skin:  Skin is warm, dry and intact. No rash noted. Psychiatric: Mood and affect are normal. Speech and behavior are  normal.    ED Results / Procedures / Treatments   Labs (all labs ordered are listed, but only abnormal results are displayed) Labs Reviewed - No data to display   EKG     RADIOLOGY Please see ED Course for my review and interpretation.  I personally reviewed all radiographic images ordered to evaluate for the above acute complaints and reviewed radiology reports and findings.  These findings were personally discussed with the patient.  Please see medical record for radiology report.    PROCEDURES:  Critical Care performed: No  Procedures   MEDICATIONS ORDERED IN ED: Medications - No data to display   IMPRESSION / MDM / ASSESSMENT AND PLAN / ED COURSE  I reviewed the triage vital signs and the nursing notes.                              Differential diagnosis includes, but is not limited to, tension, cluster, mass, SAH, otitis, GCA, migraine, occipital neuralgia  Patient presented to the ER for evaluation of right-sided headache as described above.  Neuroexam is nonfocal.  Observed to have a few of these episodes  that are very brief in nature although spasm-like while in the ER.  His CT head on my review and interpretation does not show any evidence of mass.  No acute findings per radiology.  This is not consistent with meningitis or encephalitis.  Not consistent with subarachnoid.  Have considered neuralgia but just not seem like typical distribution as it appears to go more posteriorly.  I do not observe any findings to suggest shingles.  His symptoms seem to be very mild at this point and patient was primarily concerned of recurrent mass given his history therefore think it is reasonable to manage conservatively at this time.  Will give referral as an outpatient.  We discussed signs and symptoms which she should return to the ER.       FINAL CLINICAL IMPRESSION(S) / ED DIAGNOSES   Final diagnoses:  Nonintractable headache, unspecified chronicity pattern,  unspecified headache type     Rx / DC Orders   ED Discharge Orders     None        Note:  This document was prepared using Dragon voice recognition software and may include unintentional dictation errors.    Willy Eddy, MD 06/04/23 [phone]

## 2023-06-04 NOTE — ED Triage Notes (Signed)
  Patient comes in with R sided ear pain that he states started around 30 hours ago.  Patient states the pain was initially felt in his R ear but now is felt above/around the R ear.  Patient denies any injury.  No redness/swelling.  Patient states pain is intermittent.  Took 1000 mg of tylenol around 1400.  No pain at this time.

## 2023-06-09 ENCOUNTER — Telehealth: Payer: Self-pay

## 2023-06-09 NOTE — Telephone Encounter (Signed)
Transition Care Management Unsuccessful Follow-up Telephone Call  Date of discharge and from where:  [place] 6/14  Attempts:  1st Attempt  Reason for unsuccessful TCM follow-up call:  Unable to leave message   Sekai Nayak [facility], [place] [phone] [address], [place], [place] [address] Phone: [phone] Email: [email or web address]       

## 2023-06-10 ENCOUNTER — Telehealth: Payer: Self-pay

## 2023-06-10 NOTE — Telephone Encounter (Signed)
Transition Care Management Follow-up Telephone Call Date of discharge and from where: [facility] 6/14 How have you been since you were released from the hospital? Doing fine  Any questions or concerns? No  Items Reviewed: Did the pt receive and understand the discharge instructions provided? Yes  Medications obtained and verified? No  Other? No  Any new allergies since your discharge? No  Dietary orders reviewed? No Do you have support at home? Yes     Follow up appointments reviewed:  PCP Hospital f/u appt confirmed? No  Scheduled to see  on  @ . Specialist Hospital f/u appt confirmed? No  Scheduled to see  on  @ . Are transportation arrangements needed? No  If their condition worsens, is the pt aware to call PCP or go to the Emergency Dept.? Yes Was the patient provided with contact information for the PCP's office or ED? No Was to pt encouraged to call back with questions or concerns? No

## 2023-06-23 ENCOUNTER — Other Ambulatory Visit: Payer: Self-pay | Admitting: Cardiovascular Disease

## 2023-06-28 DIAGNOSIS — H2513 Age-related nuclear cataract, bilateral: Secondary | ICD-10-CM | POA: Diagnosis not present

## 2023-06-28 DIAGNOSIS — H40002 Preglaucoma, unspecified, left eye: Secondary | ICD-10-CM | POA: Diagnosis not present

## 2023-06-28 DIAGNOSIS — H401111 Primary open-angle glaucoma, right eye, mild stage: Secondary | ICD-10-CM | POA: Diagnosis not present

## 2023-06-28 NOTE — Progress Notes (Signed)
Cardiology Office Note  Date:  06/29/2023   ID:  Gregory [name], DOB 10/05/1941, MRN [id number]  PCP:  Barbette Reichmann, MD   Chief Complaint  Patient presents with   3 month follow up     "Doing well." Patient off Olmesartan Medoxomil due to Hypotensive BP & was @ [facility] for a GI bleed on June 04, 2023. Medications reviewed by the patient verbally.     HPI:  82 year old gentleman with  CAD on cardiac catheterization October 2018 Occluded PDA, collaterals, medical management recommended morbid obesity,  prostate cancer status post resection,  essential hypertension  who denies any prior cardiac history,  2005, acoutic neuroma He presents for follow-up of his coronary artery disease  Last seen by myself in clinic November 2022  In the hospital March 2024 with GI bleed Blood bright blood per rectum noted, at that time on baby aspirin Felt to be secondary to diverticuli, colonoscopy with no active bleeding hemoglobin 15.5 down to 12.2 Aspirin held by discharge team HCTZ and olmesartan also held  Was cleared to restart aspirin by GI April 2024  No further bleeding today on asa 81 daily  Off olmesartan as blood pressure has been well-controlled on amlodipine and atenolol Denies chest pain concerning for angina  Labs reviewed A1C 5.9 Total chol 127, LDL 63 Normal BMP and CBC  Other past medical history reviewed Prior brain surgery 2005, acoustic neuroma , subsequent hearing issue,  Medical history reviewed Cath  10/06/2017 showed Occluded PDA at the ostium, small vessel Moderate disease of OM1 proximal region, Moderate to severe disease of proximal PL branch,  Moderate disease of ostial/proximal diagonal branch #2,  Mild diffuse disease of LAD. Medical management was recommended of his residual disease, no intervention performed Occluded PDA has collaterals, diffusely diseased likely with thrombus.  He was started on aspirin Plavix, statin Already on  beta-blocker  Echocardiogram normal LV function  PMH:   has a past medical history of Actinic keratosis, Aortic root dilatation (HCC) (10/06/2017), Arthritis, Basal cell carcinoma (08/21/2009), Coronary artery disease (10/06/2017), Diastolic dysfunction (10/06/2017), Dysplastic nevus (08/23/2008), Dysplastic nevus (02/21/2010), GERD (gastroesophageal reflux disease), History of acoustic neuroma (02/2004), History of kidney stones, HLD (hyperlipidemia), Hypertension, Loss of equilibrium, NSTEMI (non-ST elevated myocardial infarction) (HCC) (10/06/2017), OSA (obstructive sleep apnea), Prostate cancer (HCC), Rheumatic fever, and Squamous cell carcinoma of skin (07/27/2018).  PSH:    Past Surgical History:  Procedure Laterality Date   BLADDER SURGERY  12/21/2010   Dr. Leonette Monarch, [facility]   CHOLECYSTECTOMY     COLONOSCOPY WITH PROPOFOL N/A 03/05/2023   Procedure: COLONOSCOPY WITH PROPOFOL;  Surgeon: Regis Bill, MD;  Location: [facility] ENDOSCOPY;  Service: Endoscopy;  Laterality: N/A;   CRANIOTOMY Left 02/2004   Procedure: CRANIOTOMY FOR ACOUSTIC NEUROMA REMOVAL; Location: [facility]   CRANIOTOMY FOR REPAIR DURAL / CSF LEAK N/A 06/13/2004   Procedure: SECONDARY SUBOCCIPITAL CRANIOTOMY WITH CLOSURE OF INTERNAL AUDITORY CANAL, CLOSURE OF DURA, AND PLACEMENT OF LUMBAR DRAIN; Location: [facility]   CYSTOSCOPY WITH RETROGRADE URETHROGRAM  03/16/2016   Procedure: CYSTOSCOPY WITH RETROGRADE URETHROGRAM;  Surgeon: Vanna Scotland, MD;  Location: [facility] ORS;  Service: Urology;;   CYSTOSCOPY WITH STENT PLACEMENT Left 02/23/2016   Procedure: CYSTOSCOPY WITH STENT PLACEMENT;  Surgeon: Eliot Ford, MD;  Location: [facility] ORS;  Service: Urology;  Laterality: Left;   CYSTOSCOPY WITH STENT PLACEMENT Left 03/16/2016   Procedure: CYSTOSCOPY WITH STENT PLACEMENT/EXCHANGE;  Surgeon: Vanna Scotland, MD;  Location: [facility] ORS;  Service: Urology;  Laterality: Left;   LEFT  HEART CATH AND CORONARY ANGIOGRAPHY N/A 10/06/2017    Procedure: LEFT HEART CATH AND CORONARY ANGIOGRAPHY;  Surgeon: Antonieta Iba, MD;  Location: [facility] INVASIVE CV LAB;  Service: Cardiovascular;  Laterality: N/A;   MELANOMA EXCISION     PROSTATECTOMY  12/21/2001   Dr. Leonette Monarch, [facility]   SHOULDER ARTHROSCOPY WITH SUBACROMIAL DECOMPRESSION, ROTATOR CUFF REPAIR AND BICEP TENDON REPAIR Left 02/05/2022   Procedure: SHOULDER ARTHROSCOPY WITH EXTENSIVE DEBRIDEMENT, DECOMPRESSION,;  Surgeon: Christena Flake, MD;  Location: [facility] ORS;  Service: Orthopedics;  Laterality: Left;   STONE EXTRACTION WITH BASKET  03/16/2016   Procedure: STONE EXTRACTION WITH BASKET;  Surgeon: Vanna Scotland, MD;  Location: [facility] ORS;  Service: Urology;;    Current Outpatient Medications  Medication Sig Dispense Refill   amLODipine (NORVASC) 10 MG tablet TAKE 1 TABLET EVERY MORNING 90 tablet 0   aspirin EC 81 MG tablet Take 1 tablet (81 mg total) by mouth daily. Swallow whole. 90 tablet 3   atenolol (TENORMIN) 25 MG tablet TAKE 1 TABLET EVERY DAY IN THE EVENING 90 tablet 0   atorvastatin (LIPITOR) 40 MG tablet TAKE 1 TABLET EVERY DAY 90 tablet 0   diclofenac Sodium (VOLTAREN) 1 % GEL Apply 2 g topically 4 (four) times daily.     fesoterodine (TOVIAZ) 8 MG TB24 tablet TAKE 1 TABLET EVERY DAY 90 tablet 3   latanoprost (XALATAN) 0.005 % ophthalmic solution Place 1 drop into both eyes at bedtime.     nitroGLYCERIN (NITROSTAT) 0.4 MG SL tablet Place 1 tablet (0.4 mg total) under the tongue every 5 (five) minutes as needed for chest pain (hold for blood pressure less than 90). 30 tablet 0   olmesartan (BENICAR) 40 MG tablet TAKE 1 TABLET EVERY EVENING (Patient not taking: Reported on 06/29/2023) 90 tablet 0   No current facility-administered medications for this visit.    Allergies:   Iodine and Shellfish allergy   Social History:  The patient  reports that he has never smoked. He has never used smokeless tobacco. He reports that he does not drink alcohol and does not use drugs.    Family History:   family history includes CAD in his father; Polycystic kidney disease in his father.    Review of Systems: Review of Systems  Constitutional: Negative.   HENT: Negative.    Respiratory: Negative.    Cardiovascular: Negative.   Gastrointestinal: Negative.   Musculoskeletal:  Positive for joint pain.       Gait instability  Neurological: Negative.  Negative for loss of consciousness.  Psychiatric/Behavioral: Negative.    All other systems reviewed and are negative.   PHYSICAL EXAM: VS:  BP 124/82 (BP Location: Left Arm, Patient Position: Sitting, Cuff Size: Normal)   Pulse 70   Ht 5\' 8"  (1.727 m)   Wt 199 lb (90.3 kg)   SpO2 96%   BMI 30.26 kg/m  , BMI Body mass index is 30.26 kg/m. Constitutional:  oriented to person, place, and time. No distress.  HENT:  Head: Grossly normal Eyes:  no discharge. No scleral icterus.  Neck: No JVD, no carotid bruits  Cardiovascular: Regular rate and rhythm, no murmurs appreciated Pulmonary/Chest: Clear to auscultation bilaterally, no wheezes or rails Abdominal: Soft.  no distension.  no tenderness.  Musculoskeletal: Normal range of motion Neurological:  normal muscle tone. Coordination normal. No atrophy Skin: Skin warm and dry Psychiatric: normal affect, pleasant  Recent Labs: 03/05/2023: ALT 26; BUN 23; Creatinine, Ser 1.11; Potassium 3.7; Sodium 140 03/23/2023: Hemoglobin 13.6;  Platelets 286    Lipid Panel Lab Results  Component Value Date   CHOL 167 10/06/2017   HDL 37 (L) 10/06/2017   LDLCALC 104 (H) 10/06/2017   TRIG 128 10/06/2017      Wt Readings from Last 3 Encounters:  06/29/23 199 lb (90.3 kg)  06/04/23 205 lb (93 kg)  05/18/23 204 lb (92.5 kg)     ASSESSMENT AND PLAN:  Coronary artery disease of native artery of native heart with stable angina pectoris (HCC) Currently with no symptoms of angina. No further workup at this time. Continue current medication regimen.  Essential  hypertension Will continue amlodipine and atenolol Off HCTZ and olmesartan given low blood pressure  Mixed hyperlipidemia Cholesterol is at goal on the current lipid regimen. No changes to the medications were made.  Diabetes type 2 with complications No regular exercise program, weight stable, still working A1c 5.9  Non-ST elevation (NSTEMI) myocardial infarction (HCC) on aspirin,  Cholesterol and diabetes at goal   Total encounter time more than 30 minutes  Greater than 50% was spent in counseling and coordination of care with the patient    No orders of the defined types were placed in this encounter.    Signed, Dossie Arbour, M.D., Ph.D. 06/29/2023  [facility] [place], [place] [phone]

## 2023-06-29 ENCOUNTER — Ambulatory Visit: Payer: Medicare HMO | Attending: Cardiovascular Disease | Admitting: Cardiovascular Disease

## 2023-06-29 ENCOUNTER — Encounter: Payer: Self-pay | Admitting: Cardiovascular Disease

## 2023-06-29 VITALS — BP 124/82 | HR 70 | Ht 68.0 in | Wt 199.0 lb

## 2023-06-29 DIAGNOSIS — I25118 Atherosclerotic heart disease of native coronary artery with other forms of angina pectoris: Secondary | ICD-10-CM | POA: Diagnosis not present

## 2023-06-29 DIAGNOSIS — E782 Mixed hyperlipidemia: Secondary | ICD-10-CM | POA: Diagnosis not present

## 2023-06-29 DIAGNOSIS — I10 Essential (primary) hypertension: Secondary | ICD-10-CM | POA: Diagnosis not present

## 2023-06-29 NOTE — Patient Instructions (Signed)
Monitor pressure   Medication Instructions:  No changes  If you need a refill on your cardiac medications before your next appointment, please call your pharmacy.   Lab work: No new labs needed  Testing/Procedures: No new testing needed  Follow-Up: At [facility], you and your health needs are our priority.  As part of our continuing mission to provide you with exceptional heart care, we have created designated Provider Care Teams.  These Care Teams include your primary Cardiologist (physician) and Advanced Practice Providers (APPs -  Physician Assistants and Nurse Practitioners) who all work together to provide you with the care you need, when you need it.  You will need a follow up appointment in 12 months  Providers on your designated Care Team:   Nicolasa Ducking, NP Eula Listen, PA-C Cadence Fransico Michael, [place]  COVID-19 Vaccine Information can be found at: [email or web address] For questions related to vaccine distribution or appointments, please email [email or web address] or call [phone].

## 2023-07-16 DIAGNOSIS — I2510 Atherosclerotic heart disease of native coronary artery without angina pectoris: Secondary | ICD-10-CM | POA: Diagnosis not present

## 2023-07-16 DIAGNOSIS — C61 Malignant neoplasm of prostate: Secondary | ICD-10-CM | POA: Diagnosis not present

## 2023-07-16 DIAGNOSIS — R7309 Other abnormal glucose: Secondary | ICD-10-CM | POA: Diagnosis not present

## 2023-07-16 DIAGNOSIS — N62 Hypertrophy of breast: Secondary | ICD-10-CM | POA: Diagnosis not present

## 2023-07-16 DIAGNOSIS — I10 Essential (primary) hypertension: Secondary | ICD-10-CM | POA: Diagnosis not present

## 2023-07-16 DIAGNOSIS — Z6831 Body mass index (BMI) 31.0-31.9, adult: Secondary | ICD-10-CM | POA: Diagnosis not present

## 2023-07-16 DIAGNOSIS — D333 Benign neoplasm of cranial nerves: Secondary | ICD-10-CM | POA: Diagnosis not present

## 2023-07-29 DIAGNOSIS — Z8546 Personal history of malignant neoplasm of prostate: Secondary | ICD-10-CM | POA: Diagnosis not present

## 2023-07-29 DIAGNOSIS — Z85820 Personal history of malignant melanoma of skin: Secondary | ICD-10-CM | POA: Diagnosis not present

## 2023-07-29 DIAGNOSIS — Z0000 Encounter for general adult medical examination without abnormal findings: Secondary | ICD-10-CM | POA: Diagnosis not present

## 2023-07-29 DIAGNOSIS — Z86018 Personal history of other benign neoplasm: Secondary | ICD-10-CM | POA: Diagnosis not present

## 2023-07-29 DIAGNOSIS — R7309 Other abnormal glucose: Secondary | ICD-10-CM | POA: Diagnosis not present

## 2023-07-29 DIAGNOSIS — I2510 Atherosclerotic heart disease of native coronary artery without angina pectoris: Secondary | ICD-10-CM | POA: Diagnosis not present

## 2023-07-29 DIAGNOSIS — I10 Essential (primary) hypertension: Secondary | ICD-10-CM | POA: Diagnosis not present

## 2023-08-24 DIAGNOSIS — H903 Sensorineural hearing loss, bilateral: Secondary | ICD-10-CM | POA: Diagnosis not present

## 2023-08-24 DIAGNOSIS — J3489 Other specified disorders of nose and nasal sinuses: Secondary | ICD-10-CM | POA: Diagnosis not present

## 2023-08-24 DIAGNOSIS — D333 Benign neoplasm of cranial nerves: Secondary | ICD-10-CM | POA: Diagnosis not present

## 2023-08-24 DIAGNOSIS — J329 Chronic sinusitis, unspecified: Secondary | ICD-10-CM | POA: Diagnosis not present

## 2023-08-24 DIAGNOSIS — H6123 Impacted cerumen, bilateral: Secondary | ICD-10-CM | POA: Diagnosis not present

## 2023-09-08 ENCOUNTER — Encounter: Payer: Medicare HMO | Admitting: Dermatology

## 2023-09-13 ENCOUNTER — Ambulatory Visit: Payer: Medicare HMO | Admitting: Dermatology

## 2023-09-13 ENCOUNTER — Encounter: Payer: Self-pay | Admitting: Dermatology

## 2023-09-13 DIAGNOSIS — D1801 Hemangioma of skin and subcutaneous tissue: Secondary | ICD-10-CM

## 2023-09-13 DIAGNOSIS — Z86006 Personal history of melanoma in-situ: Secondary | ICD-10-CM | POA: Diagnosis not present

## 2023-09-13 DIAGNOSIS — Z1283 Encounter for screening for malignant neoplasm of skin: Secondary | ICD-10-CM

## 2023-09-13 DIAGNOSIS — L570 Actinic keratosis: Secondary | ICD-10-CM

## 2023-09-13 DIAGNOSIS — L578 Other skin changes due to chronic exposure to nonionizing radiation: Secondary | ICD-10-CM | POA: Diagnosis not present

## 2023-09-13 DIAGNOSIS — L821 Other seborrheic keratosis: Secondary | ICD-10-CM | POA: Diagnosis not present

## 2023-09-13 DIAGNOSIS — Z86018 Personal history of other benign neoplasm: Secondary | ICD-10-CM

## 2023-09-13 DIAGNOSIS — W908XXA Exposure to other nonionizing radiation, initial encounter: Secondary | ICD-10-CM

## 2023-09-13 DIAGNOSIS — D492 Neoplasm of unspecified behavior of bone, soft tissue, and skin: Secondary | ICD-10-CM

## 2023-09-13 DIAGNOSIS — L814 Other melanin hyperpigmentation: Secondary | ICD-10-CM

## 2023-09-13 DIAGNOSIS — Z85828 Personal history of other malignant neoplasm of skin: Secondary | ICD-10-CM

## 2023-09-13 DIAGNOSIS — L818 Other specified disorders of pigmentation: Secondary | ICD-10-CM

## 2023-09-13 DIAGNOSIS — Z872 Personal history of diseases of the skin and subcutaneous tissue: Secondary | ICD-10-CM

## 2023-09-13 HISTORY — DX: Other melanin hyperpigmentation: L81.4

## 2023-09-13 NOTE — Progress Notes (Signed)
Follow-Up Visit   Subjective  Gregory [name] is a 82 y.o. male who presents for the following: Yearly Skin Cancer Screening and Upper Body Skin Exam, hx of precancers on his scalp,hx of Melanoma in situ,  hx of BCC, hx of SCC, hx of Dysplastic nevus.   The patient presents for Upper Body Skin Exam (UBSE) for skin cancer screening and mole check. The patient has spots, moles and lesions to be evaluated, some may be new or changing and the patient may have concern these could be cancer.    The following portions of the chart were reviewed this encounter and updated as appropriate: medications, allergies, medical history  Review of Systems:  No other skin or systemic complaints except as noted in HPI or Assessment and Plan.  Objective  Well appearing patient in no apparent distress; mood and affect are within normal limits.  All skin waist up examined. Relevant physical exam findings are noted in the Assessment and Plan.  left zygoma 1 cm pigmented patch with granular pigmentation            scalp x 8 (8) Erythematous thin papules/macules with gritty scale.     Assessment & Plan   Neoplasm of skin left zygoma  Skin / nail biopsy Type of biopsy: tangential   Informed consent: discussed and consent obtained   Patient was prepped and draped in usual sterile fashion: area prepped with alochol. Anesthesia: the lesion was anesthetized in a standard fashion   Anesthetic:  1% lidocaine w/ epinephrine 1-100,000 buffered w/ 8.4% NaHCO3 Instrument used: flexible razor blade   Hemostasis achieved with: pressure, aluminum chloride and electrodesiccation   Outcome: patient tolerated procedure well   Post-procedure details: wound care instructions given   Post-procedure details comment:  Ointment and small bandage  Specimen 1 - Surgical pathology Differential Diagnosis: R/O Seborrheic keratosis vs Lentigo Maligna vs Lentigo   Check Margins: No  Lentigo vs Seborrheic  keratosis  AK (actinic keratosis) (8) scalp x 8  Actinic keratoses are precancerous spots that appear secondary to cumulative UV radiation exposure/sun exposure over time. They are chronic with expected duration over 1 year. A portion of actinic keratoses will progress to squamous cell carcinoma of the skin. It is not possible to reliably predict which spots will progress to skin cancer and so treatment is recommended to prevent development of skin cancer.  Recommend daily broad spectrum sunscreen SPF 30+ to sun-exposed areas, reapply every 2 hours as needed.  Recommend staying in the shade or wearing long sleeves, sun glasses (UVA+UVB protection) and wide brim hats (4-inch brim around the entire circumference of the hat). Call for new or changing lesions.   Destruction of lesion - scalp x 8 (8) Complexity: simple   Destruction method: cryotherapy   Informed consent: discussed and consent obtained   Timeout:  patient name, date of birth, surgical site, and procedure verified Lesion destroyed using liquid nitrogen: Yes   Region frozen until ice ball extended beyond lesion: Yes   Outcome: patient tolerated procedure well with no complications   Post-procedure details: wound care instructions given     Skin cancer screening performed today.  Actinic Damage - Chronic condition, secondary to cumulative UV/sun exposure - diffuse scaly erythematous macules with underlying dyspigmentation - Recommend daily broad spectrum sunscreen SPF 30+ to sun-exposed areas, reapply every 2 hours as needed.  - Staying in the shade or wearing long sleeves, sun glasses (UVA+UVB protection) and wide brim hats (4-inch brim around the entire  circumference of the hat) are also recommended for sun protection.  - Call for new or changing lesions.  Lentigines, Seborrheic Keratoses, Hemangiomas - Benign normal skin lesions - Benign-appearing - Call for any changes  Melanocytic Nevi - Tan-brown and/or  pink-flesh-colored symmetric macules and papules - Benign appearing on exam today - Observation - Call clinic for new or changing moles - Recommend daily use of broad spectrum spf 30+ sunscreen to sun-exposed areas.   HISTORY OF DYSPLASTIC NEVUS Right vertex scalp  No evidence of recurrence today Recommend regular full body skin exams Recommend daily broad spectrum sunscreen SPF 30+ to sun-exposed areas, reapply every 2 hours as needed.  Call if any new or changing lesions are noted between office visits    HISTORY OF BASAL CELL CARCINOMA OF THE SKIN Left neck  - No evidence of recurrence today - Recommend regular full body skin exams - Recommend daily broad spectrum sunscreen SPF 30+ to sun-exposed areas, reapply every 2 hours as needed.  - Call if any new or changing lesions are noted between office visits   HISTORY OF SQUAMOUS CELL CARCINOMA OF THE SKIN Right posterior ear  - No evidence of recurrence today - No lymphadenopathy - Recommend regular full body skin exams - Recommend daily broad spectrum sunscreen SPF 30+ to sun-exposed areas, reapply every 2 hours as needed.  - Call if any new or changing lesions are noted between office visits  History of Melanoma in Situ. Right vertex scalp. 2009, 2011. Mod-severe atypical nevus bordering on early evolving MIS. - No evidence of recurrence today - Recommend regular full body skin exams - Recommend daily broad spectrum sunscreen SPF 30+ to sun-exposed areas, reapply every 2 hours as needed.  - Call if any new or changing lesions are noted between office visits    Return in about 6 months (around 03/12/2024) for AKs and UBSE hx of Melanoma in situ, hx of BCC, hx of SCC, hx of Dysplastic nevus .  IAngelique Holm, CMA, am acting as scribe for Elie Goody, MD .   Documentation: I have reviewed the above documentation for accuracy and completeness, and I agree with the above.  Elie Goody, MD

## 2023-09-13 NOTE — Patient Instructions (Addendum)
Cryotherapy Aftercare  Wash gently with soap and water everyday.   Apply Vaseline and Band-Aid daily until healed.     Wound Care Instructions  Cleanse wound gently with soap and water once a day then pat dry with clean gauze. Apply a thin coat of Petrolatum (petroleum jelly, "Vaseline") over the wound (unless you have an allergy to this). We recommend that you use a new, sterile tube of Vaseline. Do not pick or remove scabs. Do not remove the yellow or white "healing tissue" from the base of the wound.  Cover the wound with fresh, clean, nonstick gauze and secure with paper tape. You may use Band-Aids in place of gauze and tape if the wound is small enough, but would recommend trimming much of the tape off as there is often too much. Sometimes Band-Aids can irritate the skin.  You should call the office for your biopsy report after 1 week if you have not already been contacted.  If you experience any problems, such as abnormal amounts of bleeding, swelling, significant bruising, significant pain, or evidence of infection, please call the office immediately.  FOR ADULT SURGERY PATIENTS: If you need something for pain relief you may take 1 extra strength Tylenol (acetaminophen) AND 2 Ibuprofen (200mg  each) together every 4 hours as needed for pain. (do not take these if you are allergic to them or if you have a reason you should not take them.) Typically, you may only need pain medication for 1 to 3 days.        Due to recent changes in healthcare laws, you may see results of your pathology and/or laboratory studies on MyChart before the doctors have had a chance to review them. We understand that in some cases there may be results that are confusing or concerning to you. Please understand that not all results are received at the same time and often the doctors may need to interpret multiple results in order to provide you with the best plan of care or course of treatment. Therefore, we ask  that you please give [place] 2 business days to thoroughly review all your results before contacting the office for clarification. Should we see a critical lab result, you will be contacted sooner.   If You Need Anything After Your Visit  If you have any questions or concerns for your doctor, please call our main line at [phone] and press option 4 to reach your doctor's medical assistant. If no one answers, please leave a voicemail as directed and we will return your call as soon as possible. Messages left after 4 pm will be answered the following business day.   You may also send [place] a message via MyChart. We typically respond to MyChart messages within 1-2 business days.  For prescription refills, please ask your pharmacy to contact our office. Our fax number is [phone].  If you have an urgent issue when the clinic is closed that cannot wait until the next business day, you can page your doctor at the number below.    Please note that while we do our best to be available for urgent issues outside of office hours, we are not available 24/7.   If you have an urgent issue and are unable to reach [place], you may choose to seek medical care at your doctor's office, retail clinic, urgent care center, or emergency room.  If you have a medical emergency, please immediately call 911 or go to the emergency department.  Pager Numbers  -  Dr. Gwen Pounds: [phone]  - Dr. Roseanne Reno: [phone]  - Dr. Katrinka Blazing: [phone]   In the event of inclement weather, please call our main line at [phone] for an update on the status of any delays or closures.  Dermatology Medication Tips: Please keep the boxes that topical medications come in in order to help keep track of the instructions about where and how to use these. Pharmacies typically print the medication instructions only on the boxes and not directly on the medication tubes.   If your medication is too expensive, please contact our office at  [phone] option 4 or send [place] a message through MyChart.   We are unable to tell what your co-pay for medications will be in advance as this is different depending on your insurance coverage. However, we may be able to find a substitute medication at lower cost or fill out paperwork to get insurance to cover a needed medication.   If a prior authorization is required to get your medication covered by your insurance company, please allow [place] 1-2 business days to complete this process.  Drug prices often vary depending on where the prescription is filled and some pharmacies may offer cheaper prices.  The website www.goodrx.com contains coupons for medications through different pharmacies. The prices here do not account for what the cost may be with help from insurance (it may be cheaper with your insurance), but the website can give you the price if you did not use any insurance.  - You can print the associated coupon and take it with your prescription to the pharmacy.  - You may also stop by our office during regular business hours and pick up a GoodRx coupon card.  - If you need your prescription sent electronically to a different pharmacy, notify our office through [facility] or by phone at [phone] option 4.     Si Usted Necesita Algo Despus de Su Visita  Tambin puede enviarnos un mensaje a travs de Clinical cytogeneticist. Por lo general respondemos a los mensajes de MyChart en el transcurso de 1 a 2 das hbiles.  Para renovar recetas, por favor pida a su farmacia que se ponga en contacto con nuestra oficina. Annie Sable de fax es [place] [phone].  Si tiene un asunto urgente cuando la clnica est cerrada y que no puede esperar hasta el siguiente da hbil, puede llamar/localizar a su doctor(a) al nmero que aparece a continuacin.   Por favor, tenga en cuenta que aunque hacemos todo lo posible para estar disponibles para asuntos urgentes fuera del horario de [place], no estamos  disponibles las 24 horas del da, los 7 [address]  [address] de la [place].   Si tiene un problema urgente y no puede comunicarse con nosotros, puede optar por buscar atencin mdica  en el consultorio de su doctor(a), en una clnica privada, en un centro de atencin urgente o en una sala de emergencias.  Si tiene Engineer, drilling, por favor llame inmediatamente al 911 o vaya a la sala de emergencias.  Nmeros de bper  - Dr. Gwen Pounds: [phone]  - Dra. Roseanne Reno: [phone]  - Dr. Katrinka Blazing: [phone]   En caso de inclemencias del tiempo, por favor llame a Lacy Duverney principal al [phone] para una actualizacin sobre el [place] de cualquier retraso o cierre.  Consejos para la medicacin en dermatologa: Por favor, guarde las cajas en las que vienen los medicamentos de uso tpico para ayudarle a seguir las instrucciones sobre dnde y cmo usarlos. Las farmacias generalmente imprimen las instrucciones  del medicamento slo en las cajas y no directamente en los tubos del medicamento.   Si su medicamento es muy caro, por favor, pngase en contacto con Rolm Gala llamando al [phone] y presione la opcin 4 o envenos un mensaje a travs de Clinical cytogeneticist.   No podemos decirle cul ser su copago por los medicamentos por adelantado ya que esto es diferente dependiendo de la cobertura de su seguro. Sin embargo, es posible que podamos encontrar un medicamento sustituto a Audiological scientist un formulario para que el seguro cubra el medicamento que se considera necesario.   Si se requiere una autorizacin previa para que su compaa de seguros [place] su medicamento, por favor permtanos de 1 a 2 das hbiles para completar [address]  Los precios de los medicamentos varan con frecuencia dependiendo del Environmental consultant de dnde se surte la receta y alguna farmacias pueden ofrecer precios ms baratos.  El sitio web www.goodrx.com tiene cupones para medicamentos de Health and safety inspector. Los precios aqu no  tienen en cuenta lo que podra costar con la ayuda del seguro (puede ser ms barato con su seguro), pero el sitio web puede darle el precio si no utiliz Tourist information centre manager.  - Puede imprimir el cupn correspondiente y llevarlo con su receta a la farmacia.  - Tambin puede pasar por nuestra oficina durante el horario de atencin regular y Education officer, museum una tarjeta de cupones de GoodRx.  - Si necesita que su receta se enve electrnicamente a una farmacia diferente, informe a nuestra oficina a travs de MyChart de [place] o por telfono llamando al [phone] y presione la opcin 4.

## 2023-09-17 DIAGNOSIS — Z23 Encounter for immunization: Secondary | ICD-10-CM | POA: Diagnosis not present

## 2023-09-17 LAB — SURGICAL PATHOLOGY

## 2023-09-20 ENCOUNTER — Telehealth: Payer: Self-pay

## 2023-09-20 NOTE — Telephone Encounter (Signed)
Called patient. N/A. LMOVM to C/B for pathology results.

## 2023-09-20 NOTE — Telephone Encounter (Signed)
-----   Message from [place] sent at 09/17/2023  4:05 PM EDT ----- Diagnosis: Skin, left zygoma :       SOLAR LENTIGO, WITH SCATTERED ATYPICAL MELANOCYTES, SEE DESCRIPTION    Plan: please call to share result. The spot we removed from your left cheek was a "sunspot" or lentigo. The pathologist thinks it is benign (meaning NOT SKIN CANCER), however some features were atypical. This means that we should continue to monitor the area on future skin checks. Please contact us if any brown spots form as the area heals.

## 2023-09-20 NOTE — Telephone Encounter (Signed)
Discussed pathology results. Patient voiced understanding. Will recheck site at next appointment.

## 2023-09-21 ENCOUNTER — Other Ambulatory Visit: Payer: Self-pay | Admitting: Cardiovascular Disease

## 2023-12-06 ENCOUNTER — Other Ambulatory Visit: Payer: Self-pay | Admitting: Urology

## 2024-01-21 IMAGING — MR MR SHOULDER*L* W/O CM
4 of 5 series · 31 of 40 positions shown · non-contrast
Comparison: X-ray shoulder 12/20/2021.

CLINICAL DATA: Left shoulder pain limited range of motion related
to a fall since 12/12/2021.

EXAM:
MRI OF THE LEFT SHOULDER WITHOUT CONTRAST
TECHNIQUE: Multiplanar, multisequence MR imaging of the shoulder was performed.
No intravenous contrast was administered.

[Series 5: T2 fat-sat · axial · left · 4.0mm · 0.44mm/px · z∈[-93,+27]mm · 8 of 26 slices shown (1 of 3)]
[im 1/26]
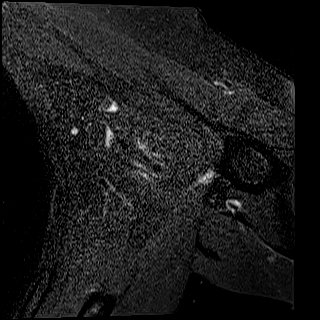
[im 4/26]
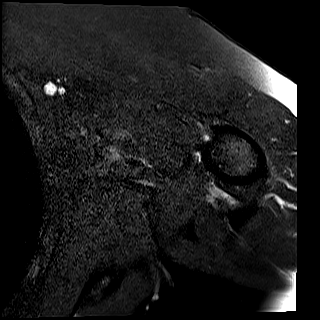
[im 8/26]
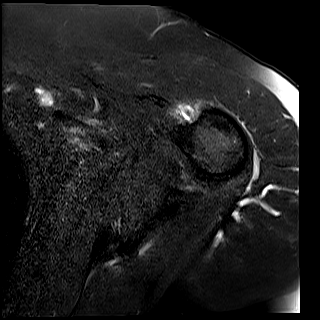
[im 11/26]
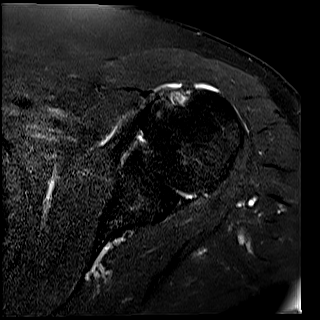
[im 15/26]
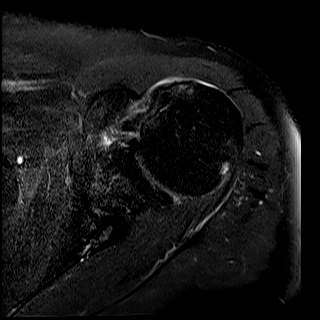
[im 18/26]
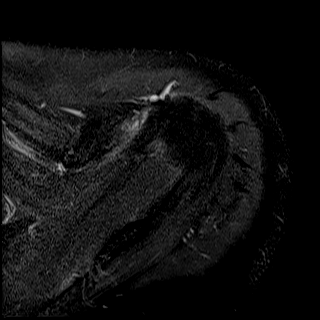
[im 22/26]
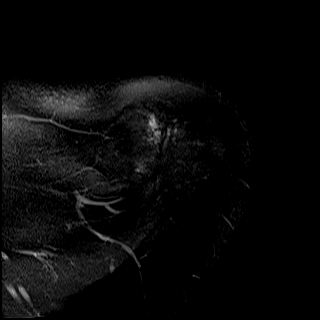
[im 26/26]
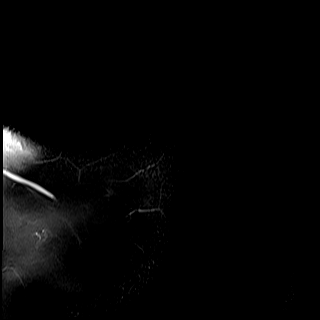

[Series 6: PD · oblique · left · 4.0mm · 0.44mm/px · 9 of 26 slices shown]
[im 1/26]
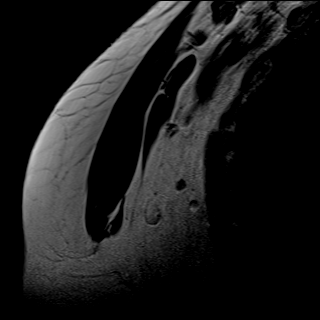
[im 4/26]
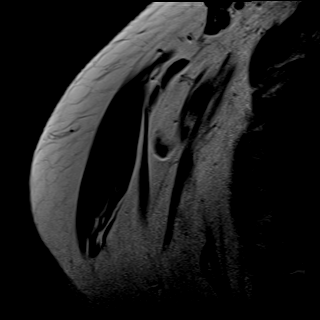
[im 7/26]
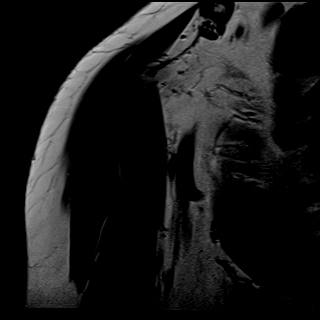
[im 10/26]
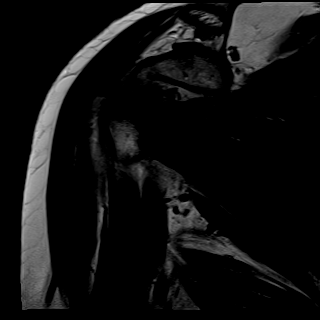
[im 13/26]
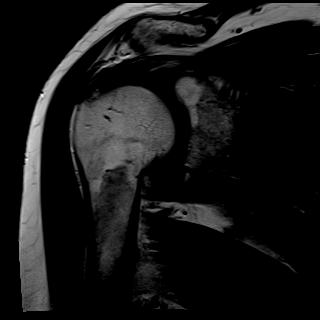
[im 16/26]
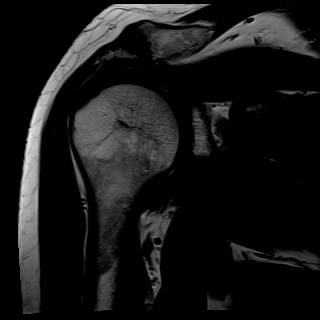
[im 19/26]
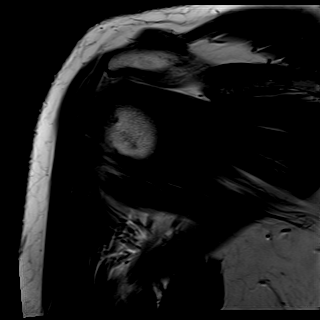
[im 22/26]
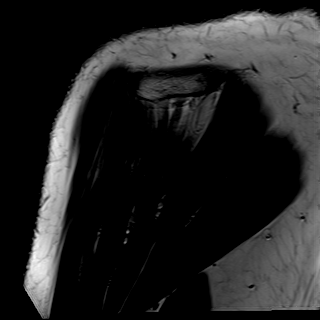
[im 26/26]
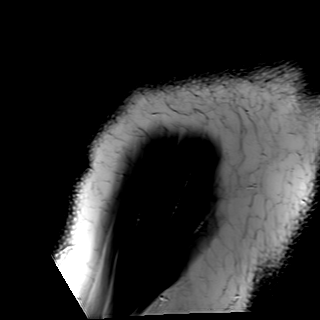

[Series 7: T2 fat-sat · oblique · left · 4.0mm · 0.44mm/px · 9 of 26 slices shown (2 of 3)]
[im 1/26]
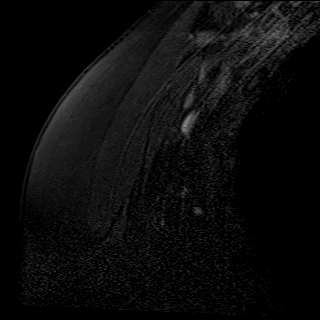
[im 4/26]
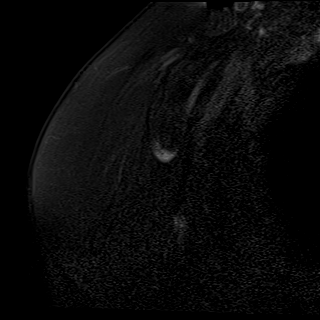
[im 7/26]
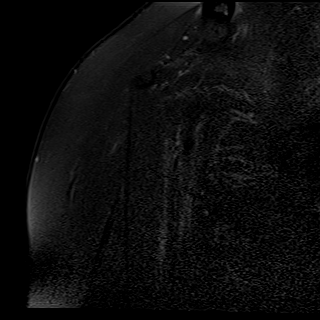
[im 10/26]
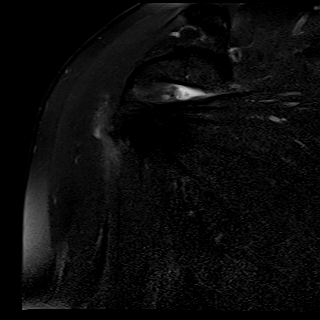
[im 13/26]
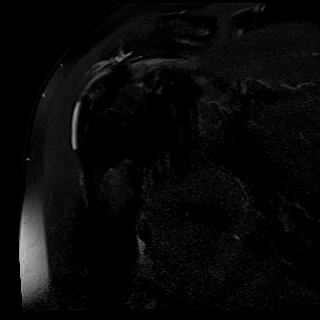
[im 16/26]
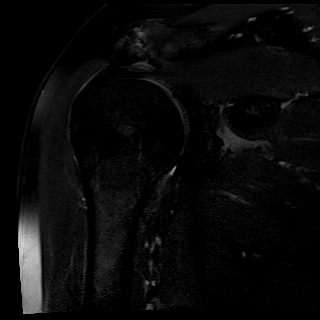
[im 19/26]
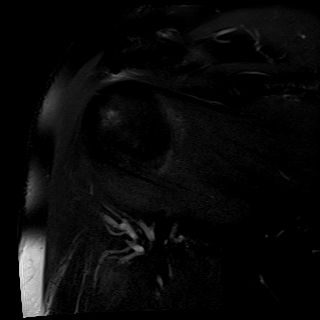
[im 22/26]
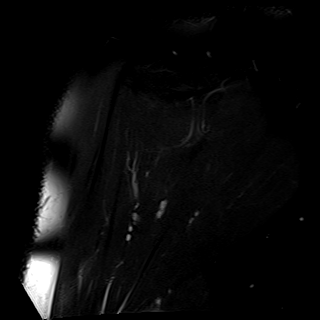
[im 26/26]
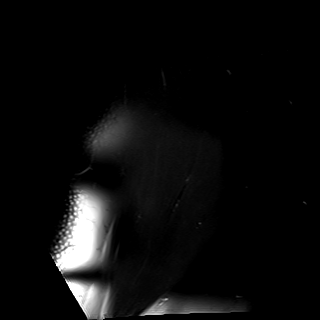

[Series 8: T2 fat-sat · oblique · left · 4.0mm · 0.22mm/px · 5 of 22 slices shown (3 of 3)]
[im 1/22]
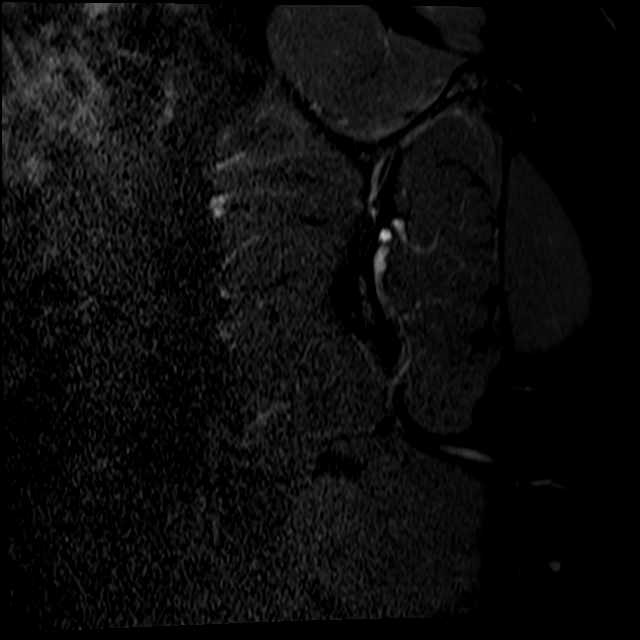
[im 4/22]
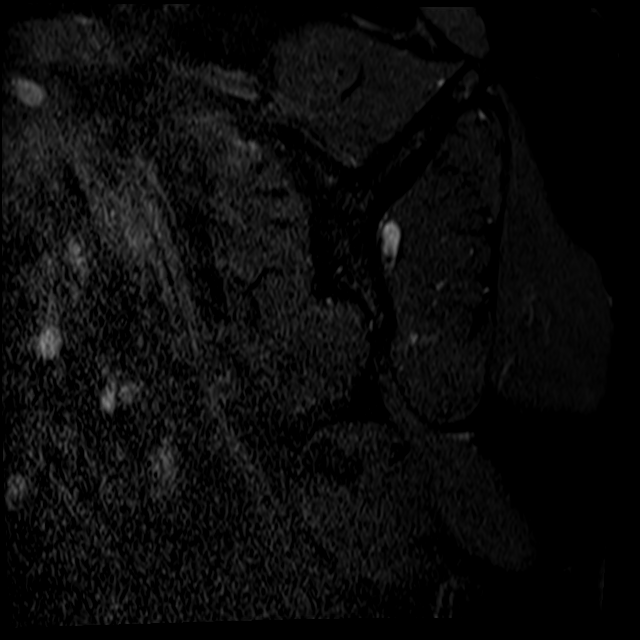
[im 8/22]
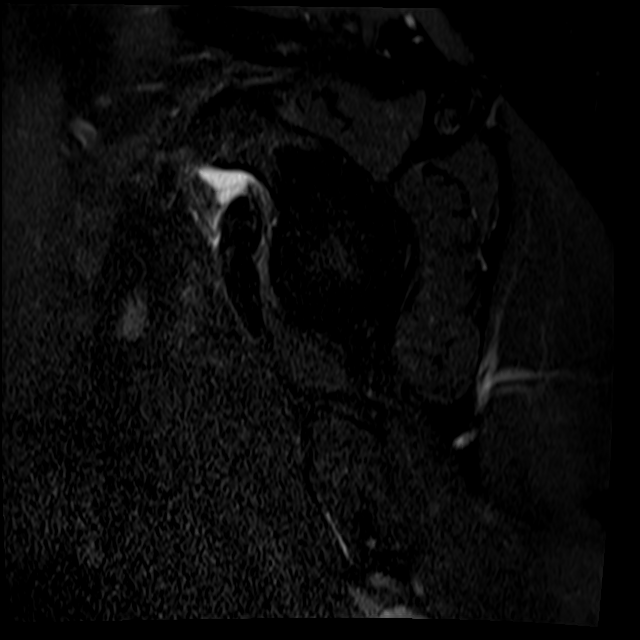
[im 11/22]
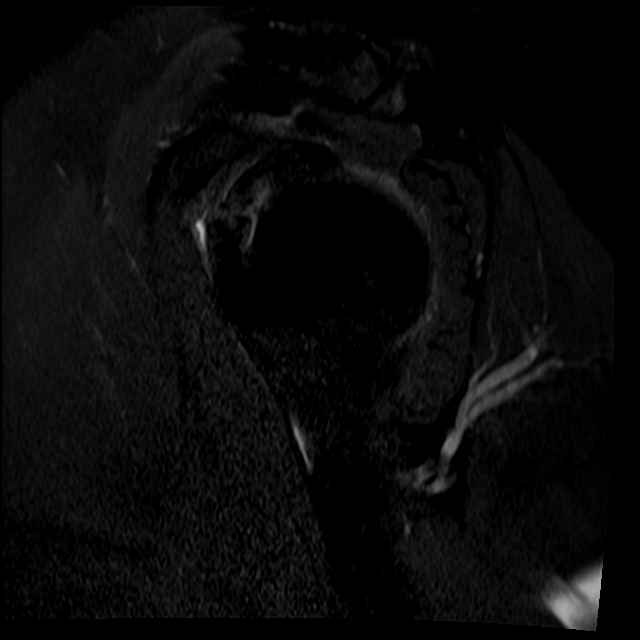
[im 18/22]
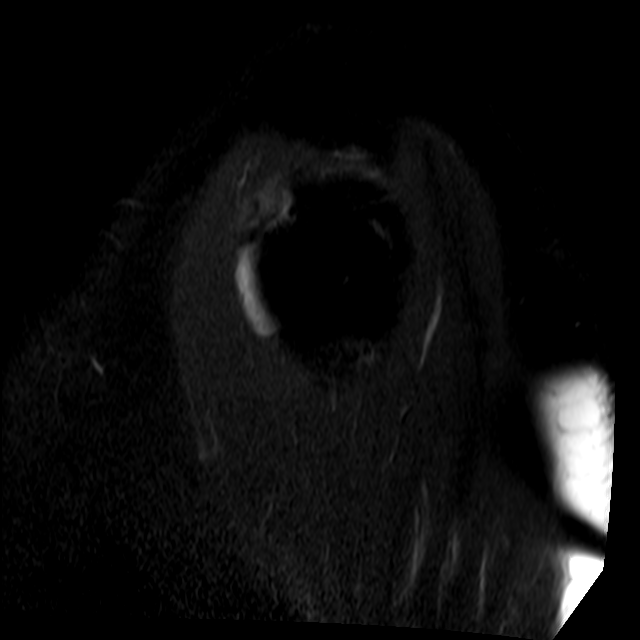

[31 of 40 positions shown; findings below may reference images not displayed]

FINDINGS: Rotator cuff: Mild tendinosis of the supraspinatus tendon.
Infraspinatus tendon is intact. Teres minor tendon is intact. Mild
tendinosis of the subscapularis tendon.

Muscles: No muscle atrophy or edema. No intramuscular fluid
collection or hematoma.

Biceps Long Head: Severe tendinosis of the intra-articular portion
and proximal extra-articular portion of the long head of the biceps
tendon with a high-grade partial-thickness tear.

Acromioclavicular Joint: Moderate arthropathy of the
acromioclavicular joint. Trace subacromial/subdeltoid bursal fluid.

Glenohumeral Joint: No joint effusion. No chondral defect.
Thickening of the inferior joint capsule as can be seen with
adhesive capsulitis.

Labrum: Grossly intact, but evaluation is limited by lack of
intraarticular fluid/contrast.

Bones: No fracture or dislocation. No aggressive osseous lesion.

Other: No fluid collection or hematoma.
IMPRESSION: 1. Mild tendinosis of the supraspinatus tendon.
2. Mild tendinosis of the subscapularis tendon.
3. Severe tendinosis of the intra-articular portion and proximal
extra-articular portion of the long head of the biceps tendon with a
high-grade partial-thickness tear.
4. Thickening of the inferior joint capsule as can be seen with
adhesive capsulitis.

## 2024-03-13 ENCOUNTER — Encounter: Payer: Self-pay | Admitting: Dermatology

## 2024-03-13 ENCOUNTER — Ambulatory Visit: Payer: Medicare Other | Admitting: Dermatology

## 2024-03-13 DIAGNOSIS — L820 Inflamed seborrheic keratosis: Secondary | ICD-10-CM

## 2024-03-13 DIAGNOSIS — Z85828 Personal history of other malignant neoplasm of skin: Secondary | ICD-10-CM

## 2024-03-13 DIAGNOSIS — D485 Neoplasm of uncertain behavior of skin: Secondary | ICD-10-CM

## 2024-03-13 DIAGNOSIS — D492 Neoplasm of unspecified behavior of bone, soft tissue, and skin: Secondary | ICD-10-CM

## 2024-03-13 DIAGNOSIS — L821 Other seborrheic keratosis: Secondary | ICD-10-CM

## 2024-03-13 DIAGNOSIS — Z86006 Personal history of melanoma in-situ: Secondary | ICD-10-CM

## 2024-03-13 DIAGNOSIS — L578 Other skin changes due to chronic exposure to nonionizing radiation: Secondary | ICD-10-CM | POA: Diagnosis not present

## 2024-03-13 DIAGNOSIS — D1801 Hemangioma of skin and subcutaneous tissue: Secondary | ICD-10-CM

## 2024-03-13 DIAGNOSIS — Z1283 Encounter for screening for malignant neoplasm of skin: Secondary | ICD-10-CM | POA: Diagnosis not present

## 2024-03-13 DIAGNOSIS — W908XXA Exposure to other nonionizing radiation, initial encounter: Secondary | ICD-10-CM

## 2024-03-13 DIAGNOSIS — L814 Other melanin hyperpigmentation: Secondary | ICD-10-CM | POA: Diagnosis not present

## 2024-03-13 DIAGNOSIS — L570 Actinic keratosis: Secondary | ICD-10-CM

## 2024-03-13 DIAGNOSIS — D225 Melanocytic nevi of trunk: Secondary | ICD-10-CM | POA: Diagnosis not present

## 2024-03-13 DIAGNOSIS — D692 Other nonthrombocytopenic purpura: Secondary | ICD-10-CM

## 2024-03-13 DIAGNOSIS — D229 Melanocytic nevi, unspecified: Secondary | ICD-10-CM

## 2024-03-13 NOTE — Patient Instructions (Addendum)
 Cryotherapy Aftercare  Wash gently with soap and water everyday.   Apply Vaseline and Band-Aid daily until healed.    Wound Care Instructions  Cleanse wound gently with soap and water once a day then pat dry with clean gauze. Apply a thin coat of Petrolatum (petroleum jelly, "Vaseline") over the wound (unless you have an allergy to this). We recommend that you use a new, sterile tube of Vaseline. Do not pick or remove scabs. Do not remove the yellow or white "healing tissue" from the base of the wound.  Cover the wound with fresh, clean, nonstick gauze and secure with paper tape. You may use Band-Aids in place of gauze and tape if the wound is small enough, but would recommend trimming much of the tape off as there is often too much. Sometimes Band-Aids can irritate the skin.  You should call the office for your biopsy report after 1 week if you have not already been contacted.  If you experience any problems, such as abnormal amounts of bleeding, swelling, significant bruising, significant pain, or evidence of infection, please call the office immediately.  FOR ADULT SURGERY PATIENTS: If you need something for pain relief you may take 1 extra strength Tylenol (acetaminophen) AND 2 Ibuprofen (200mg  each) together every 4 hours as needed for pain. (do not take these if you are allergic to them or if you have a reason you should not take them.) Typically, you may only need pain medication for 1 to 3 days.   Melanoma ABCDEs  Melanoma is the most dangerous type of skin cancer, and is the leading cause of death from skin disease.  You are more likely to develop melanoma if you: Have light-colored skin, light-colored eyes, or red or blond hair Spend a lot of time in the sun Tan regularly, either outdoors or in a tanning bed Have had blistering sunburns, especially during childhood Have a close family member who has had a melanoma Have atypical moles or large birthmarks  Early detection of  melanoma is key since treatment is typically straightforward and cure rates are extremely high if we catch it early.   The first sign of melanoma is often a change in a mole or a new dark spot.  The ABCDE system is a way of remembering the signs of melanoma.  A for asymmetry:  The two halves do not match. B for border:  The edges of the growth are irregular. C for color:  A mixture of colors are present instead of an even brown color. D for diameter:  Melanomas are usually (but not always) greater than 6mm - the size of a pencil eraser. E for evolution:  The spot keeps changing in size, shape, and color.  Please check your skin once per month between visits. You can use a small mirror in front and a large mirror behind you to keep an eye on the back side or your body.   If you see any new or changing lesions before your next follow-up, please call to schedule a visit.  Please continue daily skin protection including broad spectrum sunscreen SPF 30+ to sun-exposed areas, reapplying every 2 hours as needed when you're outdoors.    Due to recent changes in healthcare laws, you may see results of your pathology and/or laboratory studies on MyChart before the doctors have had a chance to review them. We understand that in some cases there may be results that are confusing or concerning to you. Please understand that not all  results are received at the same time and often the doctors may need to interpret multiple results in order to provide you with the best plan of care or course of treatment. Therefore, we ask that you please give [place] 2 business days to thoroughly review all your results before contacting the office for clarification. Should we see a critical lab result, you will be contacted sooner.   If You Need Anything After Your Visit  If you have any questions or concerns for your doctor, please call our main line at [phone] and press option 4 to reach your doctor's medical assistant. If  no one answers, please leave a voicemail as directed and we will return your call as soon as possible. Messages left after 4 pm will be answered the following business day.   You may also send [place] a message via MyChart. We typically respond to MyChart messages within 1-2 business days.  For prescription refills, please ask your pharmacy to contact our office. Our fax number is [phone].  If you have an urgent issue when the clinic is closed that cannot wait until the next business day, you can page your doctor at the number below.    Please note that while we do our best to be available for urgent issues outside of office hours, we are not available 24/7.   If you have an urgent issue and are unable to reach [place], you may choose to seek medical care at your doctor's office, retail clinic, urgent care center, or emergency room.  If you have a medical emergency, please immediately call 911 or go to the emergency department.  Pager Numbers  - Dr. Gwen Pounds: [phone]  - Dr. Roseanne Reno: [phone]  - Dr. Katrinka Blazing: [phone]   In the event of inclement weather, please call our main line at [phone] for an update on the status of any delays or closures.  Dermatology Medication Tips: Please keep the boxes that topical medications come in in order to help keep track of the instructions about where and how to use these. Pharmacies typically print the medication instructions only on the boxes and not directly on the medication tubes.   If your medication is too expensive, please contact our office at [phone] option 4 or send [place] a message through MyChart.   We are unable to tell what your co-pay for medications will be in advance as this is different depending on your insurance coverage. However, we may be able to find a substitute medication at lower cost or fill out paperwork to get insurance to cover a needed medication.   If a prior authorization is required to get your medication  covered by your insurance company, please allow [place] 1-2 business days to complete this process.  Drug prices often vary depending on where the prescription is filled and some pharmacies may offer cheaper prices.  The website www.goodrx.com contains coupons for medications through different pharmacies. The prices here do not account for what the cost may be with help from insurance (it may be cheaper with your insurance), but the website can give you the price if you did not use any insurance.  - You can print the associated coupon and take it with your prescription to the pharmacy.  - You may also stop by our office during regular business hours and pick up a GoodRx coupon card.  - If you need your prescription sent electronically to a different pharmacy, notify our office through [facility] or by phone at  [phone] option 4.     Si Usted Necesita Algo Despus de Su Visita  Tambin puede enviarnos un mensaje a travs de Clinical cytogeneticist. Por lo general respondemos a los mensajes de MyChart en el transcurso de 1 a 2 das hbiles.  Para renovar recetas, por favor pida a su farmacia que se ponga en contacto con nuestra oficina. Annie Sable de fax es [place] [phone].  Si tiene un asunto urgente cuando la clnica est cerrada y que no puede esperar hasta el siguiente da hbil, puede llamar/localizar a su doctor(a) al nmero que aparece a continuacin.   Por favor, tenga en cuenta que aunque hacemos todo lo posible para estar disponibles para asuntos urgentes fuera del horario de [place], no estamos disponibles las 24 horas del da, los 7 [address]  [address] de la [place].   Si tiene un problema urgente y no puede comunicarse con nosotros, puede optar por buscar atencin mdica  en el consultorio de su doctor(a), en una clnica privada, en un centro de atencin urgente o en una sala de emergencias.  Si tiene Engineer, drilling, por favor llame inmediatamente al 911 o vaya a la sala de  emergencias.  Nmeros de bper  - Dr. Gwen Pounds: [phone]  - Dra. Roseanne Reno: [phone]  - Dr. Katrinka Blazing: [phone]   En caso de inclemencias del tiempo, por favor llame a Lacy Duverney principal al [phone] para una actualizacin sobre el [place] de cualquier retraso o cierre.  Consejos para la medicacin en dermatologa: Por favor, guarde las cajas en las que vienen los medicamentos de uso tpico para ayudarle a seguir las instrucciones sobre dnde y cmo usarlos. Las farmacias generalmente imprimen las instrucciones del medicamento slo en las cajas y no directamente en los tubos del [place].   Si su medicamento es muy caro, por favor, pngase en contacto con Rolm Gala llamando al [phone] y presione la opcin 4 o envenos un mensaje a travs de Clinical cytogeneticist.   No podemos decirle cul ser su copago por los medicamentos por adelantado ya que esto es diferente dependiendo de la cobertura de su seguro. Sin embargo, es posible que podamos encontrar un medicamento sustituto a Audiological scientist un formulario para que el seguro cubra el medicamento que se considera necesario.   Si se requiere una autorizacin previa para que su compaa de seguros [place] su medicamento, por favor permtanos de 1 a 2 das hbiles para completar [address]  Los precios de los medicamentos varan con frecuencia dependiendo del Environmental consultant de dnde se surte la receta y alguna farmacias pueden ofrecer precios ms baratos.  El sitio web www.goodrx.com tiene cupones para medicamentos de Health and safety inspector. Los precios aqu no tienen en cuenta lo que podra costar con la ayuda del seguro (puede ser ms barato con su seguro), pero el sitio web puede darle el precio si no utiliz Tourist information centre manager.  - Puede imprimir el cupn correspondiente y llevarlo con su receta a la farmacia.  - Tambin puede pasar por nuestra oficina durante el horario de atencin regular y Education officer, museum una tarjeta de cupones de GoodRx.  - Si  necesita que su receta se enve electrnicamente a una farmacia diferente, informe a nuestra oficina a travs de MyChart de [place] o por telfono llamando al [phone] y presione la opcin 4.

## 2024-03-13 NOTE — Progress Notes (Signed)
 Follow-Up Visit   Subjective  Gregory [name] is a 83 y.o. male who presents for the following: Skin Cancer Screening and Upper Body Skin Exam  The patient presents for Upper Body Skin Exam (UBSE) for skin cancer screening and mole check. The patient has spots, moles and lesions to be evaluated, some may be new or changing and the patient may have concern these could be cancer.  Hx MMis, SCS, BCC, DN, AK. Patient does have a spot at right face that it itchy.  The following portions of the chart were reviewed this encounter and updated as appropriate: medications, allergies, medical history  Review of Systems:  No other skin or systemic complaints except as noted in HPI or Assessment and Plan.  Objective  Well appearing patient in no apparent distress; mood and affect are within normal limits.  All skin waist up examined. Relevant physical exam findings are noted in the Assessment and Plan.  R lat forehead x 1, R preauricular x 1 (2) Erythematous stuck-on, waxy papule or plaque R tragus x 1, R preauricular x 1, R cheek x 1, L zygoma x 1, L angle of mandible x 1, L helical insertion x 1 (6) Erythematous thin papules/macules with gritty scale.  right mid back 6 mm pink irregular macule with surrounding pigment   Assessment & Plan   INFLAMED SEBORRHEIC KERATOSIS (2) R lat forehead x 1, R preauricular x 1 (2) Symptomatic, irritating, patient would like treated.  Benign-appearing.  Call clinic for new or changing lesions.   Destruction of lesion - R lat forehead x 1, R preauricular x 1 (2) Complexity: simple   Destruction method: cryotherapy   Informed consent: discussed and consent obtained   Timeout:  patient name, date of birth, surgical site, and procedure verified Lesion destroyed using liquid nitrogen: Yes   Region frozen until ice ball extended beyond lesion: Yes   Cryo cycles: 1 or 2. Outcome: patient tolerated procedure well with no complications   Post-procedure  details: wound care instructions given   AK (ACTINIC KERATOSIS) (6) R tragus x 1, R preauricular x 1, R cheek x 1, L zygoma x 1, L angle of mandible x 1, L helical insertion x 1 (6) Actinic keratoses are precancerous spots that appear secondary to cumulative UV radiation exposure/sun exposure over time. They are chronic with expected duration over 1 year. A portion of actinic keratoses will progress to squamous cell carcinoma of the skin. It is not possible to reliably predict which spots will progress to skin cancer and so treatment is recommended to prevent development of skin cancer.  Recommend daily broad spectrum sunscreen SPF 30+ to sun-exposed areas, reapply every 2 hours as needed.  Recommend staying in the shade or wearing long sleeves, sun glasses (UVA+UVB protection) and wide brim hats (4-inch brim around the entire circumference of the hat). Call for new or changing lesions. Destruction of lesion - R tragus x 1, R preauricular x 1, R cheek x 1, L zygoma x 1, L angle of mandible x 1, L helical insertion x 1 (6) Complexity: simple   Destruction method: cryotherapy   Informed consent: discussed and consent obtained   Timeout:  patient name, date of birth, surgical site, and procedure verified Lesion destroyed using liquid nitrogen: Yes   Region frozen until ice ball extended beyond lesion: Yes   Cryo cycles: 1 or 2. Outcome: patient tolerated procedure well with no complications   Post-procedure details: wound care instructions given   NEOPLASM  OF UNCERTAIN BEHAVIOR OF SKIN right mid back Skin / nail biopsy Type of biopsy: tangential   Informed consent: discussed and consent obtained   Timeout: patient name, date of birth, surgical site, and procedure verified   Procedure prep:  Patient was prepped and draped in usual sterile fashion Prep type:  Isopropyl alcohol Anesthesia: the lesion was anesthetized in a standard fashion   Anesthetic:  1% lidocaine w/ epinephrine 1-100,000  buffered w/ 8.4% NaHCO3 Instrument used: DermaBlade   Hemostasis achieved with: pressure and aluminum chloride   Outcome: patient tolerated procedure well   Post-procedure details: sterile dressing applied and wound care instructions given   Dressing type: bandage and petrolatum   Specimen 1 - Surgical pathology Differential Diagnosis: nevus vs melanoma  Check Margins: No MULTIPLE BENIGN NEVI   LENTIGINES   ACTINIC ELASTOSIS   SEBORRHEIC KERATOSES   CHERRY ANGIOMA   SOLAR PURPURA (HCC)   Skin cancer screening performed today.  Actinic Damage - Chronic condition, secondary to cumulative UV/sun exposure - diffuse scaly erythematous macules with underlying dyspigmentation - Recommend daily broad spectrum sunscreen SPF 30+ to sun-exposed areas, reapply every 2 hours as needed.  - Staying in the shade or wearing long sleeves, sun glasses (UVA+UVB protection) and wide brim hats (4-inch brim around the entire circumference of the hat) are also recommended for sun protection.  - Call for new or changing lesions.  Lentigines, Seborrheic Keratoses, Hemangiomas - Benign normal skin lesions - Benign-appearing - Call for any changes  Melanocytic Nevi - Tan-brown and/or pink-flesh-colored symmetric macules and papules - Benign appearing on exam today - Observation - Call clinic for new or changing moles - Recommend daily use of broad spectrum spf 30+ sunscreen to sun-exposed areas.   HISTORY OF DYSPLASTIC NEVUS Right vertex scalp  No evidence of recurrence today Recommend regular full body skin exams Recommend daily broad spectrum sunscreen SPF 30+ to sun-exposed areas, reapply every 2 hours as needed.  Call if any new or changing lesions are noted between office visits      HISTORY OF BASAL CELL CARCINOMA OF THE SKIN Left neck  - No evidence of recurrence today - Recommend regular full body skin exams - Recommend daily broad spectrum sunscreen SPF 30+ to sun-exposed  areas, reapply every 2 hours as needed.  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  - Call if any new or changing lesions are noted between office visits   History of Melanoma in Situ. Right vertex scalp. 2009, 2011. Mod-severe atypical nevus bordering on early evolving MIS. - No evidence of recurrence today - Recommend regular full body skin exams - Recommend daily broad spectrum sunscreen SPF 30+ to sun-exposed areas, reapply every 2 hours as needed.  - Call if any new or changing lesions are noted between office visits   Return in about 6 months (around 09/13/2024) for TBSE, HxAK, HxBCC, HxDN, HxMMis, HxSCC, with Dr. Katrinka Blazing.  Anise Salvo, RMA, am acting as scribe for Elie Goody, MD .   Documentation: I have reviewed the above documentation for accuracy and completeness, and I agree with the above.  Elie Goody, MD

## 2024-03-14 ENCOUNTER — Encounter: Payer: Self-pay | Admitting: Dermatology

## 2024-03-14 LAB — SURGICAL PATHOLOGY

## 2024-03-20 ENCOUNTER — Encounter: Payer: Self-pay | Admitting: Urology

## 2024-03-28 ENCOUNTER — Encounter: Payer: Self-pay | Admitting: Ophthalmology

## 2024-03-28 NOTE — Anesthesia Preprocedure Evaluation (Addendum)
 Anesthesia Evaluation  Patient identified by MRN, date of birth, ID band Patient awake    Reviewed: Allergy & Precautions, H&P , NPO status , Patient's Chart, lab work & pertinent test results  Airway Mallampati: II  TM Distance: >3 FB Neck ROM: Full    Dental no notable dental hx. (+) Poor Dentition, Chipped Chipped, Poor Dentition, Caps, Dental Advisory Given:   Pulmonary neg pulmonary ROS, sleep apnea    Pulmonary exam normal breath sounds clear to auscultation       Cardiovascular hypertension, + CAD and + Past MI  negative cardio ROS Normal cardiovascular exam Rhythm:Regular Rate:Normal  10-06-17 Left ventricle: The cavity size was normal. There was moderate    asymmetric hypertrophy of the septum. Systolic function was    normal. The estimated ejection fraction was in the range of 55%    to 60%. Wall motion was normal; there were no regional wall    motion abnormalities. Doppler parameters are consistent with    abnormal left ventricular relaxation (grade 1 diastolic    dysfunction).  - Aorta: Aortic root dimension: 41 mm (ED).  - Aortic root: The aortic root was mildly dilated.  - Left atrium: The atrium was mildly dilated.   10-06-17 cath  2nd Diag lesion, 60 %stenosed.  2nd RPLB lesion, 100 %stenosed.  3rd RPLB lesion, 60 %stenosed.  Prox LAD lesion, 40 %stenosed.  Mid LAD lesion, 40 %stenosed.  1st Mrg lesion, 60 %stenosed.   Medical management recommended   Neuro/Psych  Neuromuscular disease negative neurological ROS  negative psych ROS   GI/Hepatic negative GI ROS, Neg liver ROS,GERD  ,,  Endo/Other  negative endocrine ROS    Renal/GU Renal diseasenegative Renal ROS  negative genitourinary   Musculoskeletal negative musculoskeletal ROS (+) Arthritis ,    Abdominal   Peds negative pediatric ROS (+)  Hematology negative hematology ROS (+)   Anesthesia Other  Findings Hypertension  Overactive bladder Prostate cancer (HCC) Loss of equilibrium  Actinic keratosis Basal cell carcinoma  Dysplastic nevus Dysplastic nevus  Squamous cell carcinoma of skin OSA (obstructive sleep apnea)  History of kidney stones GERD (gastroesophageal reflux disease) Arthritis NSTEMI (non-ST elevated myocardial infarction)  10-06-17 cardiology recommended medical management 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 Coronary artery disease  Diastolic dysfunction Aortic root dilatation (HCC)  Rheumatic fever HLD (hyperlipidemia)  Melanoma in situ (HCC) Dysplastic nevus  Left-sided weakness after craniotomy History of craniotomy In 2005, had crani for acoustic neuroma, then had second crani for CSF leak Grade I diastolic dysfunction    Reproductive/Obstetrics negative OB ROS                              Anesthesia Physical Anesthesia Plan  ASA: 3  Anesthesia Plan: MAC   Post-op Pain Management:    Induction: Intravenous  PONV Risk Score and Plan:   Airway Management Planned: Natural Airway and Nasal Cannula  Additional Equipment:   Intra-op Plan:   Post-operative Plan:   Informed Consent: I have reviewed the patients History and Physical, chart, labs and discussed the procedure including the risks, benefits and alternatives for the proposed anesthesia with the patient or authorized representative who has indicated his/her understanding and acceptance.     Dental Advisory Given  Plan Discussed with: Anesthesiologist, CRNA and Surgeon  Anesthesia Plan Comments: (Patient consented for risks of anesthesia including but not limited to:  - adverse reactions to medications - damage to eyes, teeth, lips  or other oral mucosa - nerve damage due to positioning  - sore throat or hoarseness - Damage to heart, brain, nerves, lungs, other parts of body or loss of life  Patient voiced understanding and assent.)          Anesthesia Quick Evaluation

## 2024-03-30 MED ORDER — SOLIFENACIN SUCCINATE 5 MG PO TABS: 5.0000 mg | ORAL_TABLET | Freq: Every day | ORAL | 3 refills | Status: DC

## 2024-04-04 NOTE — Discharge Instructions (Signed)

## 2024-04-05 ENCOUNTER — Encounter
Admission: RE | Disposition: A | Discharge status: Home / Self Care | Payer: Self-pay | Source: Home / Self Care | Attending: Ophthalmology

## 2024-04-05 ENCOUNTER — Ambulatory Visit: Payer: Self-pay | Admitting: Anesthesiology

## 2024-04-05 ENCOUNTER — Encounter: Payer: Self-pay | Admitting: Ophthalmology

## 2024-04-05 ENCOUNTER — Other Ambulatory Visit: Payer: Self-pay

## 2024-04-05 ENCOUNTER — Ambulatory Visit
Admission: RE | Admit: 2024-04-05 | Discharge: 2024-04-05 | Disposition: A | Discharge status: Home / Self Care | Payer: Medicare HMO | Attending: Ophthalmology | Admitting: Ophthalmology

## 2024-04-05 DIAGNOSIS — Z7902 Long term (current) use of antithrombotics/antiplatelets: Secondary | ICD-10-CM | POA: Insufficient documentation

## 2024-04-05 DIAGNOSIS — K219 Gastro-esophageal reflux disease without esophagitis: Secondary | ICD-10-CM | POA: Diagnosis not present

## 2024-04-05 DIAGNOSIS — H2512 Age-related nuclear cataract, left eye: Secondary | ICD-10-CM | POA: Insufficient documentation

## 2024-04-05 DIAGNOSIS — I2510 Atherosclerotic heart disease of native coronary artery without angina pectoris: Secondary | ICD-10-CM | POA: Insufficient documentation

## 2024-04-05 DIAGNOSIS — G4733 Obstructive sleep apnea (adult) (pediatric): Secondary | ICD-10-CM | POA: Insufficient documentation

## 2024-04-05 DIAGNOSIS — I10 Essential (primary) hypertension: Secondary | ICD-10-CM | POA: Diagnosis not present

## 2024-04-05 DIAGNOSIS — I252 Old myocardial infarction: Secondary | ICD-10-CM | POA: Insufficient documentation

## 2024-04-05 HISTORY — PX: CATARACT EXTRACTION W/PHACO: SHX586

## 2024-04-05 HISTORY — DX: Other specified postprocedural states: Z98.890

## 2024-04-05 HISTORY — DX: Weakness: R53.1

## 2024-04-05 HISTORY — DX: Other ill-defined heart diseases: I51.89

## 2024-04-05 SURGERY — PHACOEMULSIFICATION, CATARACT, WITH IOL INSERTION
Anesthesia: Monitor Anesthesia Care | Site: Eye | Laterality: Left

## 2024-04-05 MED ORDER — LIDOCAINE HCL (PF) 2 % IJ SOLN
INTRAOCULAR | Status: DC | PRN
  Administered 2024-04-05: 2 mL

## 2024-04-05 MED ORDER — BRIMONIDINE TARTRATE-TIMOLOL 0.2-0.5 % OP SOLN
OPHTHALMIC | Status: DC | PRN
  Administered 2024-04-05: 1 [drp] via OPHTHALMIC

## 2024-04-05 MED ORDER — CEFUROXIME OPHTHALMIC INJECTION 1 MG/0.1 ML
INJECTION | OPHTHALMIC | Status: DC | PRN
  Administered 2024-04-05: 1 mg via INTRACAMERAL

## 2024-04-05 MED ORDER — MIDAZOLAM HCL 2 MG/2ML IJ SOLN
INTRAMUSCULAR | Status: DC | PRN
  Administered 2024-04-05: 1 mg via INTRAVENOUS

## 2024-04-05 MED ORDER — SIGHTPATH DOSE#1 BSS IO SOLN
INTRAOCULAR | Status: DC | PRN
  Administered 2024-04-05: 72 mL via OPHTHALMIC

## 2024-04-05 MED ORDER — ARMC OPHTHALMIC DILATING DROPS
1.0000 | OPHTHALMIC | Status: DC | PRN
  Administered 2024-04-05 (×3): 1 via OPHTHALMIC

## 2024-04-05 MED ORDER — TETRACAINE HCL 0.5 % OP SOLN
OPHTHALMIC | Status: AC
  Filled 2024-04-05: qty 4

## 2024-04-05 MED ORDER — SIGHTPATH DOSE#1 NA HYALUR & NA CHOND-NA HYALUR IO KIT
PACK | INTRAOCULAR | Status: DC | PRN
  Administered 2024-04-05: 1 via OPHTHALMIC

## 2024-04-05 MED ORDER — FENTANYL CITRATE (PF) 100 MCG/2ML IJ SOLN
INTRAMUSCULAR | Status: DC | PRN
  Administered 2024-04-05: 50 ug via INTRAVENOUS

## 2024-04-05 MED ORDER — FENTANYL CITRATE (PF) 100 MCG/2ML IJ SOLN
INTRAMUSCULAR | Status: AC
Start: 2024-04-05 — End: ?
  Filled 2024-04-05: qty 2

## 2024-04-05 MED ORDER — MIDAZOLAM HCL 2 MG/2ML IJ SOLN
INTRAMUSCULAR | Status: AC
  Filled 2024-04-05: qty 2

## 2024-04-05 MED ORDER — SIGHTPATH DOSE#1 BSS IO SOLN
INTRAOCULAR | Status: DC | PRN
  Administered 2024-04-05: 15 mL via INTRAOCULAR

## 2024-04-05 MED ORDER — ARMC OPHTHALMIC DILATING DROPS
OPHTHALMIC | Status: AC
  Filled 2024-04-05: qty 0.5

## 2024-04-05 MED ORDER — TETRACAINE HCL 0.5 % OP SOLN
1.0000 [drp] | OPHTHALMIC | Status: DC | PRN
  Administered 2024-04-05 (×3): 1 [drp] via OPHTHALMIC

## 2024-04-05 SURGICAL SUPPLY — 10 items
CATARACT SUITE SIGHTPATH (MISCELLANEOUS) ×1 IMPLANT
FEE CATARACT SUITE SIGHTPATH (MISCELLANEOUS) ×1 IMPLANT
GLOVE BIOGEL PI IND STRL 8 (GLOVE) ×1 IMPLANT
GLOVE SURG LX STRL 7.5 STRW (GLOVE) ×1 IMPLANT
GLOVE SURG PROTEXIS BL SZ6.5 (GLOVE) ×1 IMPLANT
GLOVE SURG SYN 6.5 PF PI BL (GLOVE) ×1 IMPLANT
LENS IOL TECNIS EYHANCE 22.5 (Intraocular Lens) IMPLANT
NDL FILTER BLUNT 18X1 1/2 (NEEDLE) ×1 IMPLANT
NEEDLE FILTER BLUNT 18X1 1/2 (NEEDLE) ×1 IMPLANT
SYR 3ML LL SCALE MARK (SYRINGE) ×1 IMPLANT

## 2024-04-05 NOTE — H&P (Signed)
 [facility]   Primary Care Physician:  Barbette Reichmann, MD Ophthalmologist: Dr. Lockie Mola  Pre-Procedure History & Physical: HPI:  Gregory [name] is a 83 y.o. male here for ophthalmic surgery.   Past Medical History:  Diagnosis Date   Actinic keratosis    Aortic root dilatation (HCC) 10/06/2017   a.) TTE 10/06/2017: Ao root 41mm.   Arthritis    Basal cell carcinoma 08/21/2009   Left neck. Excised: 10/03/2009, margins free.   Coronary artery disease 10/06/2017   a.) LHC 10/06/2017: 60% D2, 100% 2nd RPLB, 60% 3rd RPLB, 40% pLAD, 40% mLAD, 60% OM1; Rx mgmt.   Diastolic dysfunction 10/06/2017   a.) TTE 10/06/2017: EF 55-60%; G1DD.   Dysplastic nevus 08/23/2008   Right vertex. Moderate to severe atypia, bordering on early evolving MMis.   Dysplastic nevus 02/21/2010   Right vertex. Moderate to severe atypia, bordering on early evolving MMis.   Dysplastic nevus 03/14/2023   Right mid back. Mild atypia, limited margins free.   GERD (gastroesophageal reflux disease)    Grade I diastolic dysfunction    History of acoustic neuroma 02/2004   a.) s/p crainotomy for removal --> complicated by CSF leak requiring 2nd crainotomy for internatal auditory canal closure, closure or dura, and placement of lumbar drain on 06/13/2004   History of craniotomy    History of kidney stones    HLD (hyperlipidemia)    Hypertension    Left-sided weakness    Residual from brain tumor- removed 2005   Loss of equilibrium    left side only due to benign brain stem tumor in 2005   Melanoma in situ [facility]) 2019   right vertex scalp   NSTEMI (non-ST elevated myocardial infarction) (HCC) 10/06/2017   a.) LHC 10/06/2017: 60% D2, 100% 2nd RPLB, 60% 3rd RPLB, 40% pLAD, 40% mLAD, 60% OM1; intervention deferred opting for medical management   OSA (obstructive sleep apnea)    a.) does not require nocturnal PAP therapy   Prostate cancer (HCC)    Rheumatic fever    Squamous cell carcinoma of  skin 07/27/2018   Right posterior ear. WD SCC. [facility]    Past Surgical History:  Procedure Laterality Date   BLADDER SURGERY  12/21/2010   Dr. Leonette Monarch, [facility]   CHOLECYSTECTOMY     COLONOSCOPY WITH PROPOFOL N/A 03/05/2023   Procedure: COLONOSCOPY WITH PROPOFOL;  Surgeon: Regis Bill, MD;  Location: [facility] ENDOSCOPY;  Service: Endoscopy;  Laterality: N/A;   CRANIOTOMY Left 02/2004   Procedure: CRANIOTOMY FOR ACOUSTIC NEUROMA REMOVAL; Location: [facility]   CRANIOTOMY FOR REPAIR DURAL / CSF LEAK N/A 06/13/2004   Procedure: SECONDARY SUBOCCIPITAL CRANIOTOMY WITH CLOSURE OF INTERNAL AUDITORY CANAL, CLOSURE OF DURA, AND PLACEMENT OF LUMBAR DRAIN; Location: [facility]   CYSTOSCOPY WITH RETROGRADE URETHROGRAM  03/16/2016   Procedure: CYSTOSCOPY WITH RETROGRADE URETHROGRAM;  Surgeon: Vanna Scotland, MD;  Location: [facility] ORS;  Service: Urology;;   CYSTOSCOPY WITH STENT PLACEMENT Left 02/23/2016   Procedure: CYSTOSCOPY WITH STENT PLACEMENT;  Surgeon: Eliot Ford, MD;  Location: [facility] ORS;  Service: Urology;  Laterality: Left;   CYSTOSCOPY WITH STENT PLACEMENT Left 03/16/2016   Procedure: CYSTOSCOPY WITH STENT PLACEMENT/EXCHANGE;  Surgeon: Vanna Scotland, MD;  Location: [facility] ORS;  Service: Urology;  Laterality: Left;   LEFT HEART CATH AND CORONARY ANGIOGRAPHY N/A 10/06/2017   Procedure: LEFT HEART CATH AND CORONARY ANGIOGRAPHY;  Surgeon: Antonieta Iba, MD;  Location: [facility] INVASIVE CV LAB;  Service: Cardiovascular;  Laterality: N/A;   MELANOMA EXCISION  PROSTATECTOMY  12/21/2001   Dr. Michaeleen Adler, [facility]   SHOULDER ARTHROSCOPY WITH SUBACROMIAL DECOMPRESSION, ROTATOR CUFF REPAIR AND BICEP TENDON REPAIR Left 02/05/2022   Procedure: SHOULDER ARTHROSCOPY WITH EXTENSIVE DEBRIDEMENT, DECOMPRESSION,;  Surgeon: Elner Hahn, MD;  Location: [facility] ORS;  Service: Orthopedics;  Laterality: Left;   STONE EXTRACTION WITH BASKET  03/16/2016   Procedure: STONE EXTRACTION WITH BASKET;  Surgeon: Dustin Gimenez, MD;   Location: [facility] ORS;  Service: Urology;;    Prior to Admission medications   Medication Sig Start Date End Date Taking? Authorizing Provider  amLODipine (NORVASC) 10 MG tablet TAKE 1 TABLET EVERY MORNING 09/21/23  Yes Gollan, Timothy J, MD  aspirin EC 81 MG tablet Take 1 tablet (81 mg total) by mouth daily. Swallow whole. 03/24/23  Yes Hammock, Sheri, NP  atenolol (TENORMIN) 25 MG tablet TAKE 1 TABLET EVERY DAY IN THE EVENING 09/21/23  Yes Gollan, Timothy J, MD  atorvastatin (LIPITOR) 40 MG tablet TAKE 1 TABLET EVERY DAY 09/21/23  Yes Gollan, Timothy J, MD  clopidogrel (PLAVIX) 75 MG tablet Take 75 mg by mouth daily.   Yes [provider]  hydrochlorothiazide (HYDRODIURIL) 25 MG tablet Take 25 mg by mouth daily.   Yes [provider]  latanoprost (XALATAN) 0.005 % ophthalmic solution Place 1 drop into both eyes at bedtime. 12/29/22  Yes [provider]  nitroGLYCERIN (NITROSTAT) 0.4 MG SL tablet Place 1 tablet (0.4 mg total) under the tongue every 5 (five) minutes as needed for chest pain (hold for blood pressure less than 90). 10/07/17  Yes Sudini, Srikar, MD  olmesartan (BENICAR) 40 MG tablet Take 40 mg by mouth daily.   Yes [provider]  solifenacin (VESICARE) 5 MG tablet Take 1 tablet (5 mg total) by mouth daily. 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    Employer: SELF EMPLOYED   Tobacco Use   Smoking status: Never   Smokeless tobacco: Never  Vaping Use   Vaping status: Never Used  Substance and Sexual Activity   Alcohol use: No   Drug use: No   Sexual activity: Not on file  Other Topics Concern   Not on file  Social History Narrative   Not on file   Social Drivers of Health   Financial Resource Strain: Low Risk  (07/29/2023)   Received from [facility]   Overall Financial Resource Strain (CARDIA)    Difficulty of Paying Living Expenses: Not hard at all  Food Insecurity: No Food Insecurity (07/29/2023)   [facility]   Hunger Vital Sign    Worried About Running Out of Food in the Last Year: Never true    Ran Out of Food in the Last Year: Never true  Transportation Needs: No Transportation Needs (07/29/2023)   [facility]   PRAPARE - Transportation    In the past 12 months, has lack of transportation kept you from medical appointments or from getting medications?: No    Lack of Transportation (Non-Medical): No  Physical Activity: Not on file  Stress: Not on file  Social Connections: Not on file  Intimate Partner Violence: Not At Risk (03/04/2023)   Humiliation, Afraid, Rape, and Kick questionnaire    Fear of Current or Ex-Partner: No    Emotionally Abused: No    Physically Abused: No    Sexually Abused: No    Review of Systems: See HPI, otherwise negative ROS  Physical Exam: BP (!) 152/82   Pulse (!) 59   Temp 98.3 F (36.8 C) (Temporal)   Resp 18   Ht 5\' 8"  (1.727 m)   Wt 89.8 kg   SpO2 97%   BMI 30.11 kg/m  General:   Alert,  pleasant and cooperative in NAD Head:  Normocephalic and atraumatic. Lungs:  Clear to auscultation.    Heart:  Regular rate and rhythm.   Impression/Plan: Nicanor Barge is here for ophthalmic surgery.  Risks, benefits, limitations, and alternatives regarding ophthalmic surgery have been reviewed with the patient.  Questions  have been answered.  All parties agreeable.   Annell Kidney, MD  04/05/2024, 9:42 AM

## 2024-04-05 NOTE — Transfer of Care (Signed)
 Immediate Anesthesia Transfer of Care Note  Patient: Gregory [name]  Procedure(s) Performed: CATARACT EXTRACTION PHACO AND INTRAOCULAR LENS PLACEMENT (IOC) LEFT MALYUGIN (Left: Eye)  Patient Location: PACU  Anesthesia Type: MAC  Level of Consciousness: awake, alert  and patient cooperative  Airway and Oxygen Therapy: Patient Spontanous Breathing and Patient connected to supplemental oxygen  Post-op Assessment: Post-op Vital signs reviewed, Patient's Cardiovascular Status Stable, Respiratory Function Stable, Patent Airway and No signs of Nausea or vomiting  Post-op Vital Signs: Reviewed and stable  Complications: No notable events documented.

## 2024-04-05 NOTE — Anesthesia Postprocedure Evaluation (Signed)
 Anesthesia Post Note  Patient: Gregory [name]  Procedure(s) Performed: CATARACT EXTRACTION PHACO AND INTRAOCULAR LENS PLACEMENT (IOC) LEFT MALYUGIN 10.62 00:43.5 (Left: Eye)  Patient location during evaluation: PACU Anesthesia Type: MAC Level of consciousness: awake and alert Pain management: pain level controlled Vital Signs Assessment: post-procedure vital signs reviewed and stable Respiratory status: spontaneous breathing, nonlabored ventilation, respiratory function stable and patient connected to nasal cannula oxygen Cardiovascular status: stable and blood pressure returned to baseline Postop Assessment: no apparent nausea or vomiting Anesthetic complications: no   No notable events documented.   Last Vitals:  Vitals:   04/05/24 1045 04/05/24 1048  BP: (!) 141/74 132/70  Pulse: (!) 54 (!) 52  Resp: 14 14  Temp:  (!) 36.4 C  SpO2: 95% 93%    Last Pain:  Vitals:   04/05/24 1048  TempSrc:   PainSc: 0-No pain                 Jilda Kress C Martia Dalby

## 2024-04-05 NOTE — Op Note (Signed)
 OPERATIVE NOTE  Gregory [name] [id number] 04/05/2024   PREOPERATIVE DIAGNOSIS:  Nuclear sclerotic cataract left eye. H25.12   POSTOPERATIVE DIAGNOSIS:    Nuclear sclerotic cataract left eye.     PROCEDURE:  Phacoemusification with posterior chamber intraocular lens placement of the left eye  Ultrasound time: Procedure(s): CATARACT EXTRACTION PHACO AND INTRAOCULAR LENS PLACEMENT (IOC) LEFT MALYUGIN 10.62 00:43.5 (Left)  LENS:   Implant Name Type Inv. Item Serial No. Manufacturer Lot No. LRB No. Used Action  LENS IOL TECNIS EYHANCE 22.5 - [id number] Intraocular Lens LENS IOL TECNIS EYHANCE 22.5 [id number] [employer]  Left 1 Implanted      SURGEON:  Berline Brenner, MD   ANESTHESIA:  Topical with tetracaine drops and 2% Xylocaine jelly, augmented with 1% preservative-free intracameral lidocaine.    COMPLICATIONS:  None.   DESCRIPTION OF PROCEDURE:  The patient was identified in the holding room and transported to the operating room and placed in the supine position under the operating microscope.  The left eye was identified as the operative eye and it was prepped and draped in the usual sterile ophthalmic fashion.   A 1 millimeter clear-corneal paracentesis was made at the 1:30 position.  0.5 ml of preservative-free 1% lidocaine was injected into the anterior chamber.  The anterior chamber was filled with Viscoat viscoelastic.  A 2.4 millimeter keratome was used to make a near-clear corneal incision at the 10:30 position.  .  A curvilinear capsulorrhexis was made with a cystotome and capsulorrhexis forceps.  Balanced salt solution was used to hydrodissect and hydrodelineate the nucleus.   Phacoemulsification was then used in stop and chop fashion to remove the lens nucleus and epinucleus.  The remaining cortex was then removed using the irrigation and aspiration handpiece. Provisc was then placed into the capsular bag to distend it for lens placement.  A lens was then injected  into the capsular bag.  The remaining viscoelastic was aspirated.   Wounds were hydrated with balanced salt solution.  The anterior chamber was inflated to a physiologic pressure with balanced salt solution.  No wound leaks were noted. Cefuroxime 0.1 ml of a 10mg /ml solution was injected into the anterior chamber for a dose of 1 mg of intracameral antibiotic at the completion of the case.   Timolol and Brimonidine drops were applied to the eye.  The patient was taken to the recovery room in stable condition without complications of anesthesia or surgery.  [name] 04/05/2024, 10:42 AM

## 2024-04-28 ENCOUNTER — Ambulatory Visit
Admission: RE | Admit: 2024-04-28 | Discharge: 2024-04-28 | Disposition: A | Discharge status: Home / Self Care | Source: Ambulatory Visit | Attending: Urology

## 2024-04-28 DIAGNOSIS — N2889 Other specified disorders of kidney and ureter: Secondary | ICD-10-CM | POA: Diagnosis not present

## 2024-04-28 DIAGNOSIS — N289 Disorder of kidney and ureter, unspecified: Secondary | ICD-10-CM | POA: Diagnosis present

## 2024-04-28 DIAGNOSIS — Z8546 Personal history of malignant neoplasm of prostate: Secondary | ICD-10-CM | POA: Diagnosis present

## 2024-04-28 DIAGNOSIS — N200 Calculus of kidney: Secondary | ICD-10-CM | POA: Insufficient documentation

## 2024-05-02 ENCOUNTER — Telehealth: Payer: Self-pay

## 2024-05-02 NOTE — Telephone Encounter (Signed)
 Radiology department called reporting abnormal results for patient.  Provider notified.

## 2024-05-16 ENCOUNTER — Ambulatory Visit: Payer: Self-pay | Admitting: Urology

## 2024-05-16 VITALS — BP 144/79 | HR 63

## 2024-05-16 DIAGNOSIS — R310 Gross hematuria: Secondary | ICD-10-CM | POA: Diagnosis not present

## 2024-05-16 DIAGNOSIS — Z8546 Personal history of malignant neoplasm of prostate: Secondary | ICD-10-CM

## 2024-05-16 DIAGNOSIS — N2889 Other specified disorders of kidney and ureter: Secondary | ICD-10-CM | POA: Diagnosis not present

## 2024-05-16 DIAGNOSIS — N3281 Overactive bladder: Secondary | ICD-10-CM | POA: Diagnosis not present

## 2024-05-16 DIAGNOSIS — N200 Calculus of kidney: Secondary | ICD-10-CM

## 2024-05-16 LAB — BLADDER SCAN AMB NON-IMAGING: Scan Result: 24

## 2024-05-16 LAB — URINALYSIS, COMPLETE
Bilirubin, UA: NEGATIVE
Glucose, UA: NEGATIVE
Ketones, UA: NEGATIVE
Leukocytes,UA: NEGATIVE
Nitrite, UA: NEGATIVE
Protein,UA: NEGATIVE
RBC, UA: NEGATIVE
Specific Gravity, UA: 1.01 (ref 1.005–1.030)
Urobilinogen, Ur: 0.2 mg/dL (ref 0.2–1.0)
pH, UA: 6 (ref 5.0–7.5)

## 2024-05-16 LAB — MICROSCOPIC EXAMINATION
Epithelial Cells (non renal): >10 /HPF — AB (ref 0–10)
RBC, Urine: NONE SEEN /HPF (ref 0–2)

## 2024-05-16 NOTE — Progress Notes (Signed)
 I,Amy L Pierron,acting as a scribe for Dustin Gimenez, MD.,have documented all relevant documentation on the behalf of Dustin Gimenez, MD,as directed by  Dustin Gimenez, MD while in the presence of Dustin Gimenez, MD.  05/16/2024 1:38 PM   Gregory [name] 81/01/1941 [id number]  Referring provider: Antonio Baumgarten, MD [address] [facility] [place],  [place] [address]  Chief Complaint  Patient presents with   Over Active Bladder    HPI: 83 year-old male with a personal history of kidney stones, prostate cancer, and renal lesions presents today for a routine annual follow-up.   History of kidney stones Last KUB 04/2017 negative for stones  s/p L URS 02/2016 for 4 mm left midureteral stone, 3 mm nonobstructing L stone  Pre2017- 6 other previous remote stone episodes which has not required surgical intervention Stone analysis 97% calcium  oxalate, 3% calcium  phosphate Cross section imaging in July of 2023 showed several small left renal calculi, unchanged.   Most recent cross sectional imaging was 03/04/2023 which showed a 2 mm left lower pole stone. Most recent renal ultrasound on 04/28/2024 shows left sided kidney stone. Overall stone burden was fairly stable.    History of benign bladder growth   TURBT in 2012, PUNLMP Previously followed by cystoscopy annually, last at time of URS (2017)   History of prostate cancer S/p open radical prostatectomy 2013 PSA 0.02 on 01/24/2024, stable   SUI/urinary urgency  In July of 2023 he was treated for an acute cystitis.  He is emptying adequately with PVR of 24 mL. IPSS below  He was previously managed on Toviaz  for his urgency symptoms. He is currently on VesiCare .  Renal lesion He had a small exophytic left upper pole lesion, mildly enlarged from 2017. On 05/19/2022 he had a CT abdomen pelvis without contrast. This is more suggestive of benign etiology.  He follows up today with a renal ultrasound which shows 2.3 cm  left upper pole renal mass; fairly stable in size.  He reports a brief episode of blood at the tip of the penis three months ago, which resolved within 24 hours.   He mentions he thinks the Toviaz  helped better and now being on Vesicare  he reports a weak stream and drowsiness as side effects.    IPSS     Row Name 05/16/24 1000         International Prostate Symptom Score   How often have you had the sensation of not emptying your bladder? Not at All     How often have you had to urinate less than every two hours? Not at All     How often have you found you stopped and started again several times when you urinated? Not at All     How often have you found it difficult to postpone urination? Not at All     How often have you had a weak urinary stream? Not at All     How often have you had to strain to start urination? Less than half the time     How many times did you typically get up at night to urinate? None     Total IPSS Score 2       Quality of Life due to urinary symptoms   If you were to spend the rest of your life with your urinary condition just the way it is now how would you feel about that? Mostly Satisfied  Score:  1-7 Mild 8-19 Moderate 20-35 Severe Results for orders placed or performed in visit on 05/16/24  Microscopic Examination   Urine  Result Value Ref Range   WBC, UA 0-5 0 - 5 /hpf   RBC, Urine None seen 0 - 2 /hpf   Epithelial Cells (non renal) >10 (A) 0 - 10 /hpf   Mucus, UA Present (A) Not Estab.   Bacteria, UA Few None seen/Few  Urinalysis, Complete  Result Value Ref Range   Specific Gravity, UA 1.010 1.005 - 1.030   pH, UA 6.0 5.0 - 7.5   Color, UA Yellow Yellow   Appearance Ur Clear Clear   Leukocytes,UA Negative Negative   Protein,UA Negative Negative/Trace   Glucose, UA Negative Negative   Ketones, UA Negative Negative   RBC, UA Negative Negative   Bilirubin, UA Negative Negative   Urobilinogen, Ur 0.2 0.2 - 1.0 mg/dL    Nitrite, UA Negative Negative   Microscopic Examination Comment    Microscopic Examination See below:   Bladder Scan (Post Void Residual) in office  Result Value Ref Range   Scan Result 24 ml     PMH: Past Medical History:  Diagnosis Date   Actinic keratosis    Aortic root dilatation (HCC) 10/06/2017   a.) TTE 10/06/2017: Ao root 41mm.   Arthritis    Basal cell carcinoma 08/21/2009   Left neck. Excised: 10/03/2009, margins free.   Coronary artery disease 10/06/2017   a.) LHC 10/06/2017: 60% D2, 100% 2nd RPLB, 60% 3rd RPLB, 40% pLAD, 40% mLAD, 60% OM1; Rx mgmt.   Diastolic dysfunction 10/06/2017   a.) TTE 10/06/2017: EF 55-60%; G1DD.   Dysplastic nevus 08/23/2008   Right vertex. Moderate to severe atypia, bordering on early evolving MMis.   Dysplastic nevus 02/21/2010   Right vertex. Moderate to severe atypia, bordering on early evolving MMis.   Dysplastic nevus 03/14/2023   Right mid back. Mild atypia, limited margins free.   GERD (gastroesophageal reflux disease)    Grade I diastolic dysfunction    History of acoustic neuroma 02/2004   a.) s/p crainotomy for removal --> complicated by CSF leak requiring 2nd crainotomy for internatal auditory canal closure, closure or dura, and placement of lumbar drain on 06/13/2004   History of craniotomy    History of kidney stones    HLD (hyperlipidemia)    Hypertension    Left-sided weakness    Residual from brain tumor- removed 2005   Loss of equilibrium    left side only due to benign brain stem tumor in 2005   Melanoma in situ [facility]) 2019   right vertex scalp   NSTEMI (non-ST elevated myocardial infarction) (HCC) 10/06/2017   a.) LHC 10/06/2017: 60% D2, 100% 2nd RPLB, 60% 3rd RPLB, 40% pLAD, 40% mLAD, 60% OM1; intervention deferred opting for medical management   OSA (obstructive sleep apnea)    a.) does not require nocturnal PAP therapy   Prostate cancer (HCC)    Rheumatic fever    Squamous cell carcinoma of skin 07/27/2018    Right posterior ear. WD [facility]. [facility]    Surgical History: Past Surgical History:  Procedure Laterality Date   BLADDER SURGERY  12/21/2010   Dr. Michaeleen Adler, [facility]   CATARACT EXTRACTION W/PHACO Left 04/05/2024   Procedure: CATARACT EXTRACTION PHACO AND INTRAOCULAR LENS PLACEMENT (IOC) LEFT MALYUGIN 10.62 00:43.5;  Surgeon: Annell Kidney, MD;  Location: [facility];  Service: Ophthalmology;  Laterality: Left;   CHOLECYSTECTOMY     COLONOSCOPY WITH PROPOFOL   N/A 03/05/2023   Procedure: COLONOSCOPY WITH PROPOFOL ;  Surgeon: Shane Darling, MD;  Location: [facility] END[facility];  Service: Endoscopy;  Laterality: N/A;   CRANIOTOMY Left 02/2004   Procedure: CRANIOTOMY FOR ACOUSTIC NEUROMA REMOVAL; Location: [facility]   CRANIOTOMY FOR REPAIR DURAL / CSF LEAK N/A 06/13/2004   Procedure: SECONDARY SUBOCCIPITAL CRANIOTOMY WITH CLOSURE OF INTERNAL AUDITORY CANAL, CLOSURE OF DURA, AND PLACEMENT OF LUMBAR DRAIN; Location: [facility]   CYSTOSCOPY WITH RETROGRADE URETHROGRAM  03/16/2016   Procedure: CYSTOSCOPY WITH RETROGRADE URETHROGRAM;  Surgeon: Dustin Gimenez, MD;  Location: [facility] ORS;  Service: Urology;;   CYSTOSCOPY WITH STENT PLACEMENT Left 02/23/2016   Procedure: CYSTOSCOPY WITH STENT PLACEMENT;  Surgeon: Rodell Citrin, MD;  Location: [facility] OR[facility];  Service: Urology;  Laterality: Left;   CYSTOSCOPY WITH STENT PLACEMENT Left 03/16/2016   Procedure: CYSTOSCOPY WITH STENT PLACEMENT/EXCHANGE;  Surgeon: Dustin Gimenez, MD;  Location: [facility] OR[facility];  Service: Urology;  Laterality: Left;   LEFT HEART CATH AND CORONARY ANGIOGRAPHY N/A 10/06/2017   Procedure: LEFT HEART CATH AND CORONARY ANGIOGRAPHY;  Surgeon: Devorah Fonder, MD;  Location: [facility] INVASIVE CV LAB;  Service: Cardiovascular;  Laterality: N/A;   MELANOMA EXCISION     PROSTATECTOMY  12/21/2001   Dr. Michaeleen Adler, [facility]   SHOULDER ARTHROSCOPY WITH SUBACROMIAL DECOMPRESSION, ROTATOR CUFF REPAIR AND BICEP TENDON REPAIR Left 02/05/2022   Procedure: SHOULDER  ARTHROSCOPY WITH EXTENSIVE DEBRIDEMENT, DECOMPRESSION,;  Surgeon: Elner Hahn, MD;  Location: [facility] OR[facility];  Service: Orthopedics;  Laterality: Left;   STONE EXTRACTION WITH BASKET  03/16/2016   Procedure: STONE EXTRACTION WITH BASKET;  Surgeon: Dustin Gimenez, MD;  Location: [facility] ORS;  Service: Urology;;    Home Medications:  Allergies as of 05/16/2024       Reactions   Iodine Anaphylaxis   Shellfish Allergy Anaphylaxis        Medication List        Accurate as of May 16, 2024  1:38 PM. If you have any questions, ask your nurse or doctor.          STOP taking these medications    clopidogrel  75 MG tablet Commonly known as: PLAVIX    hydrochlorothiazide  25 MG tablet Commonly known as: HYDRODIURIL        TAKE these medications    amLODipine  10 MG tablet Commonly known as: NORVASC  TAKE 1 TABLET EVERY MORNING   aspirin  EC 81 MG tablet Take 1 tablet (81 mg total) by mouth daily. Swallow whole.   atenolol  25 MG tablet Commonly known as: TENORMIN  TAKE 1 TABLET EVERY DAY IN THE EVENING   atorvastatin  40 MG tablet Commonly known as: LIPITOR TAKE 1 TABLET EVERY DAY   latanoprost 0.005 % ophthalmic solution Commonly known as: XALATAN Place 1 drop into both eyes at bedtime.   nitroGLYCERIN  0.4 MG SL tablet Commonly known as: NITROSTAT  Place 1 tablet (0.4 mg total) under the tongue every 5 (five) minutes as needed for chest pain (hold for blood pressure less than 90).   olmesartan  40 MG tablet Commonly known as: BENICAR  Take 40 mg by mouth daily.   solifenacin  5 MG tablet Commonly known as: VESICARE  Take 1 tablet (5 mg total) by mouth daily.        Allergies:  Allergies  Allergen Reactions   Iodine Anaphylaxis   Shellfish Allergy Anaphylaxis    Family History: Family History  Problem Relation Age of Onset   CAD Father    Polycystic kidney disease Father    Diabetes Mellitus II Neg Hx  Hypertension Neg Hx     Social History:  reports that he  has never smoked. He has never used smokeless tobacco. He reports that he does not drink alcohol  and does not use drugs.   Physical Exam: BP (!) 144/79   Pulse 63   Constitutional:  Alert and oriented, No acute distress. HEENT: [place] AT, moist mucus membranes.  Trachea midline, no masses. Neurologic: Grossly intact, no focal deficits, moving all 4 extremities. Psychiatric: Normal mood and affect.  Urinalysis    Component Value Date/Time   COLORURINE RED (A) 07/02/2022 0550   APPEARANCEUR Clear 05/16/2024 0947   LABSPEC 1.015 07/02/2022 0550   LABSPEC 1.026 02/14/2014 1933   PHURINE  07/02/2022 0550    TEST NOT REPORTED DUE TO COLOR INTERFERENCE OF URINE PIGMENT   GLUCOSEU Negative 05/16/2024 0947   GLUCOSEU see comment 02/14/2014 1933   HGBUR (A) 07/02/2022 0550    TEST NOT REPORTED DUE TO COLOR INTERFERENCE OF URINE PIGMENT   BILIRUBINUR Negative 05/16/2024 0947   BILIRUBINUR see comment 02/14/2014 1933   KETONESUR (A) 07/02/2022 0550    TEST NOT REPORTED DUE TO COLOR INTERFERENCE OF URINE PIGMENT   PROTEINUR Negative 05/16/2024 0947   PROTEINUR (A) 07/02/2022 0550    TEST NOT REPORTED DUE TO COLOR INTERFERENCE OF URINE PIGMENT   NITRITE Negative 05/16/2024 0947   NITRITE (A) 07/02/2022 0550    TEST NOT REPORTED DUE TO COLOR INTERFERENCE OF URINE PIGMENT   LEUKOCYTESUR Negative 05/16/2024 0947   LEUKOCYTESUR (A) 07/02/2022 0550    TEST NOT REPORTED DUE TO COLOR INTERFERENCE OF URINE PIGMENT   LEUKOCYTESUR see comment 02/14/2014 1933    Lab Results  Component Value Date   LABMICR Comment 05/16/2024   WBCUA 0-5 05/16/2024   RBCUA None seen 04/23/2017   LABEPIT >10 (A) 05/16/2024   MUCUS Present (A) 05/16/2024   BACTERIA Few 05/16/2024    Pertinent Imaging: Results for orders placed during the hospital encounter of 04/28/24  Ultrasound renal complete  Narrative CLINICAL DATA:  left renal mass, kidney stones  EXAM: RENAL / URINARY TRACT ULTRASOUND  COMPLETE  COMPARISON:  March 04, 2023, July 02, 2022  FINDINGS: Right Kidney:  Renal measurements: 11.6 x 5.7 x 4.9 cm = volume: 172 mL. Echogenicity is mildly increased. Multiple benign cysts are noted measuring up to 3.1 cm. No hydronephrosis.  Left Kidney:  Renal measurements: 11.8 x 6.2 x 4.9 cm = volume: 186 mL. Echogenicity is mildly increased. There are several small echogenic foci which may reflect nonobstructing nephrolithiasis. There is poorly visualized exophytic appearing hypoechoic possible mass of the superior pole of the LEFT kidney. This is estimated to measure 2.2 x 2.2 x 2.3 cm. Near anechoic mass of the inferior pole measuring up to 2.5 cm is noted.  Bladder:  Appears normal for degree of bladder distention.  Other:  None.  IMPRESSION: 1. There is a poorly visualized exophytic appearing hypoechoic possible mass of the superior pole of the LEFT kidney. This is estimated to measure 2.3 cm. This is favored to correspond to the lobulated mass at the superior pole of the LEFT kidney and is suspicious for indolent neoplasm. Recommend further evaluation with dedicated renal protocol CT or MRI with and without contrast. 2. LEFT-sided nephrolithiasis. 3. Mildly increased echogenicity of the bilateral kidneys as can be seen in medical renal disease.  These results will be called to the ordering clinician or representative by the Radiologist Assistant, and communication documented in the PACS or [employer].  Electronically Signed By: Clancy Crimes M.D. On: 05/02/2024 09:09 Narrative & Impression  CLINICAL DATA:  gi bleeding   EXAM: CTA ABDOMEN AND PELVIS WITHOUT AND WITH CONTRAST   TECHNIQUE: Multidetector CT imaging of the abdomen and pelvis was performed using the standard protocol during bolus administration of intravenous contrast. Multiplanar reconstructed images and MIPs were obtained and reviewed to evaluate the vascular anatomy.    RADIATION DOSE REDUCTION: This exam was performed according to the departmental dose-optimization program which includes automated exposure control, adjustment of the mA and/or kV according to patient size and/or use of iterative reconstruction technique.   CONTRAST:  OMNIPAQUE  IOHEXOL  350 MG/ML SOLN   COMPARISON:  07/02/2022   FINDINGS: VASCULAR   Aorta: Mild partially calcified atheromatous plaque. No aneurysm, dissection, or stenosis.   Celiac: Mildly atheromatous, widely patent.   SMA: Patent without evidence of aneurysm, dissection, vasculitis or significant stenosis.   Renals: Both renal arteries are patent without evidence of aneurysm, dissection, vasculitis, fibromuscular dysplasia or significant stenosis.   IMA: Patent without evidence of aneurysm, dissection, vasculitis or significant stenosis.   Inflow: Mildly atheromatous, widely patent.   Proximal Outflow: Mildly atheromatous, widely patent.   Veins: Patent hepatic veins, portal vein, SMV, splenic vein, bilateral renal veins. Incomplete opacification of the iliac venous system and IVC, which appear grossly unremarkable.   Review of the MIP images confirms the above findings.   NON-VASCULAR   Lower chest: No pleural or pericardial effusion. Subpleural coarse interstitial opacities posteriorly in the lung bases.   Hepatobiliary: Stable hepatic cysts. No new liver lesion. Cholecystectomy clips. No biliary ductal dilatation.   Pancreas: Unremarkable. No pancreatic ductal dilatation or surrounding inflammatory changes.   Spleen: Normal in size without focal abnormality.   Adrenals/Urinary Tract: No adrenal mass. Multiple cortical lesions are again seen throughout both kidneys as before, some of which can be characterized as cysts; no follow-up warranted. Left nephrolithiasis, largest stone 2 mm in the lower pole. No hydronephrosis. Urinary bladder incompletely distended.   Stomach/Bowel: Stomach  is incompletely distended, unremarkable. Small bowel decompressed. Normal appendix. The colon is partially distended by gas and fluid, with a few distal descending and sigmoid diverticula. No regional inflammatory change. No active extravasation.   Lymphatic: No abdominal or pelvic adenopathy.   Reproductive: Post prostatectomy with regional vascular clips.   Other: No ascites.  No free air.   Musculoskeletal: Stable mild vertebral compression deformities in the lower thoracic spine. Lower lumbar facet DJD. No acute findings.   IMPRESSION: 1. No acute findings. No active extravasation. 2. Descending and sigmoid diverticulosis. 3. Left nephrolithiasis without hydronephrosis. 4.  Aortic Atherosclerosis (ICD10-I70.0).     Electronically Signed   By: Nicoletta Barrier M.D.   On: 03/04/2023 20:31  Personally reviewed the above images and agree with radiologic interpretation.  Assessment & Plan:    Kidney stone  - Overall stone burden is stable. Asymptomatic.    2. Renal mass  -  Likely indolent. Malignancy versus benign etiology. Very little growth in eight years. A CT abdomen pelvis in 2023 deemed this most consistent with probably benign etiology. Consider repeat imaging next year.    3. History of prostate cancer  - PSA stable, no evidence of disease. - Recommend continued annual PSA testing either here or with his PCP.   4. Urinary Incontinence - On Vesicare  he reports a weak stream and drowsiness. Plan to discontinue Vesicare  temporarily and reassess symptoms. Consider alternative (B3) medications if symptoms persist, reassess at time  of cysto  5. Gross Hematuria - Brief episode of blood at the tip of the penis. Plan to perform a cystoscopy to rule out any urethral or bladder pathology, such as polyps or tumors.  Return in about 2 weeks (around 05/30/2024) for cystoscopy.  I have reviewed the above documentation for accuracy and completeness, and I agree with the above.    Dustin Gimenez, MD  [facility] [address] [place], [place] [address] [phone]

## 2024-05-16 NOTE — Patient Instructions (Signed)

## 2024-05-24 ENCOUNTER — Other Ambulatory Visit: Payer: Self-pay

## 2024-05-24 ENCOUNTER — Ambulatory Visit: Admitting: Urology

## 2024-05-24 VITALS — BP 146/71 | HR 66

## 2024-05-24 DIAGNOSIS — N3281 Overactive bladder: Secondary | ICD-10-CM

## 2024-05-24 DIAGNOSIS — D494 Neoplasm of unspecified behavior of bladder: Secondary | ICD-10-CM

## 2024-05-24 NOTE — Progress Notes (Unsigned)
 Surgical Physician Order Form [facility]  * Scheduling expectation : Next Available  *Length of Case:   *Clearance needed: no  *Anticoagulation Instructions: Hold all anticoagulants  *Aspirin  Instructions: Ok to continue Aspirin   *Post-op visit Date/Instructions:  1-2 week follow up  *Diagnosis: Bladder Tumor  *Procedure: bilateral RTG, TURBT <2cm (54098), instillation of intravesical gemcitabine   Additional orders: Gemcitabine 2000mg  bladder instillation  -Admit type: OUTpatient  -Anesthesia: General  -VTE Prophylaxis Standing Order SCD's       Other:   -Standing Lab Orders Per Anesthesia    Lab other: UA&Urine Culture  -Standing Test orders EKG/Chest x-ray per Anesthesia       Test other:   - Medications:  Ancef  2gm IV  -Other orders:  N/A

## 2024-05-24 NOTE — Progress Notes (Signed)
   05/24/24  CC:  Chief Complaint  Patient presents with   Cysto    HPI: 83 year old male with a recent episode of hematuria, remote history of PUNLMP who presents today for cystoscopy.  Please see previous notes for details.  Blood pressure (!) 146/71, pulse 66. NED. A&Ox3.   No respiratory distress   Abd soft, NT, ND Normal phallus with bilateral descended testicles  Cystoscopy Procedure Note  Patient identification was confirmed, informed consent was obtained, and patient was prepped using Betadine solution.  Lidocaine  jelly was administered per urethral meatus.     Pre-Procedure: - Inspection reveals a normal caliber ureteral meatus.  Procedure: The flexible cystoscope was introduced without difficulty - No urethral strictures/lesions are present. - Surgically absent prostate  - Open bladder neck with an approximately 1 cm papillary tumor at the 10 o'clock position at the edge of the anastomosis. - Bilateral ureteral orifices identified and the left orifice was somewhat heaped up although patent. - Bladder mucosa  reveals an additional low-grade appearing superficial tumor, slightly less than 1 cm on the right lateral bladder wall.  There is also a very subtle texture change on the left lateral bladder wall concerning for early neoplastic change. - No bladder stones - No trabeculation  Retroflexion shows tumor on bladder neck   Post-Procedure: - Patient tolerated the procedure well  Assessment/ Plan:  Bladder tumor, multifocal Multiple bladder lesions today as described above, concerning for malignancy.  I recommended proceeding to the operating room for TURBT, bilateral retrograde pyelogram and instillation of intravesical gemcitabine.  Risks and benefits were reviewed in detail including risk of bleeding, infection, damage to surrounding structures amongst others.  All questions were answered.  He is willing to proceed as planned.  He understands that I am leaving  the practice and his surgery will be scheduled with one of my partners.  He is agreeable to this.   Dustin Gimenez, MD

## 2024-05-25 ENCOUNTER — Telehealth: Payer: Self-pay

## 2024-05-25 ENCOUNTER — Telehealth: Payer: Self-pay | Admitting: *Deleted

## 2024-05-25 NOTE — Progress Notes (Signed)
    [facility] Posting Form  Surgery Date: Date: 06/06/2024  Surgeon: Dr. Darlynn Elam, MD  Inpt ( No  )   Outpt (Yes)   Obs ( No  )   Diagnosis: D49.4 Bladder Tumor  -CPT: 14782, 51720, [phone]  Surgery: Transurethral Resection of Bladder Tumor with Bilateral Retrograde Pyelograms and Intravesical Instillation of Gemcitabine  Stop Anticoagulations: Yes, will need to hold ASA for 3 days  Cardiac/Medical/Pulmonary Clearance needed: yes  Clearance needed from Dr: Jerelene Monday  Clearance request sent on: Date: 05/25/24   *Orders entered into EPIC  Date: 05/25/24   *Case booked in EPIC  Date: 05/25/24  *Notified pt of Surgery: Date: 05/25/24  PRE-OP UA & CX: yes, will obtain in clinic tomorrow 05/26/24  *Placed into Prior Authorization Work [place] Date: 05/25/24  Assistant/laser/rep:No

## 2024-05-25 NOTE — Telephone Encounter (Signed)
 Per Dr. Cherylene Corrente, Patient is to be scheduled for Transurethral Resection of Bladder Tumor with Bilateral Retrograde Pyelograms and Intravesical Instillation of Gemcitabine   Gregory [name] was contacted and possible surgical dates were discussed, Tuesday June 17th, 2025 was agreed upon for surgery.   Patient was instructed that Dr. Cherylene Corrente will require them to provide a pre-op UA & CX prior to surgery. This was ordered and scheduled drop off appointment was made for 05/26/2024.    Patient was directed to call [phone] between 1-3pm the day before surgery to find out surgical arrival time.  Instructions were given not to eat or drink from midnight on the night before surgery and have a driver for the day of surgery. On the surgery day patient was instructed to enter through the [facility] entrance of [facility] report the Same Day Surgery desk.   Pre-Admit Testing will be in contact via phone to set up an interview with the anesthesia team to review your history and medications prior to surgery.   Reminder of this information was sent via MyChart and given to the patient.

## 2024-05-25 NOTE — Telephone Encounter (Signed)
   Pre-operative Risk Assessment    Patient Name: Gregory [name]  DOB: 1941/06/01 MRN: [id number]   Date of last office visit: 06/29/23 DR. GOLLAN Date of next office visit: NONE   Request for Surgical Clearance    Procedure:  TRANSURETHRAL RESECTION OF BLADDER TUMOR WITH B/L RETROGRADE PYELOGRAMS AND INTRAVESICAL INSTILLATION OF GEMCITABINE  Date of Surgery:  Clearance 06/06/24                                Surgeon:  DR. Geralyn Knee STOIFF  Surgeon's Group or Practice Name:  [facility] Phone number:  [phone] Fax number:  [phone] MELISSA HADLEY, CMA   Type of Clearance Requested:   - Medical  - Pharmacy:  Hold Aspirin  x 3 DAYS PRIOR   Type of Anesthesia:  CHOICE   Additional requests/questions:    [place] Broom   05/25/2024, 1:15 PM

## 2024-05-25 NOTE — Progress Notes (Signed)
  Phone Number: [phone] for Surgical Coordinator Fax Number: [phone]  REQUEST FOR SURGICAL CLEARANCE       Date: Date: 05/25/24  Faxed to: Dr. Jerelene Monday, [facility]  Surgeon: Dr. Darlynn Elam, MD     Date of Surgery: 06/06/2024  Operation: Transurethral Resection of Bladder Tumor with Bilateral Retrograde Pyelograms and Intravesical Instillation of Gemcitabine   Anesthesia Type: Choice   Diagnosis: D49.4 Bladder Tumor  Patient Requires:   Cardiac / Vascular Clearance : Yes  Reason: Would like for patient to hold ASA for 3 days prior to surgery    Risk Assessment:    Low   []       Moderate   []     High   []           This patient is optimized for surgery  YES []       NO   []    I recommend further assessment/workup prior to surgery. YES []      NO  []   Appointment scheduled for: _______________________   Further recommendations: ____________________________________     Physician Signature:__________________________________   Printed Name: ________________________________________   Date: _________________

## 2024-05-25 NOTE — Telephone Encounter (Signed)
 S/W pt and scheduled TELE Preop appt 05/31/24. Med Rec and consent done

## 2024-05-25 NOTE — Telephone Encounter (Signed)
   Name: Gregory [name]  DOB: 09-23-1941  MRN: [id number]  Primary Cardiologist: Belva Boyden, MD   Preoperative team, please contact this patient and set up a phone call appointment for further preoperative risk assessment. Please obtain consent and complete medication review. Thank you for your help.  I confirm that guidance regarding antiplatelet and oral anticoagulation therapy has been completed and, if necessary, noted below.  Ideally aspirin  should be continued without interruption, however if the bleeding risk is too great, aspirin  may be held for 5-7 days prior to surgery. Please resume aspirin  post operatively when it is felt to be safe from a bleeding standpoint.    I also confirmed the patient resides in the state of [place] . As per [facility] Medical Board telemedicine laws, the patient must reside in the state in which the provider is licensed.   Ava Boatman, NP 05/25/2024, 4:04 PM [facility]

## 2024-05-25 NOTE — Telephone Encounter (Signed)
 Med Rec and consent done     Patient Consent for Virtual Visit        Gregory [name] has provided verbal consent on 05/25/2024 for a virtual visit (video or telephone).   CONSENT FOR VIRTUAL VISIT FOR:  Gregory [name]  By participating in this virtual visit I agree to the following:  I hereby voluntarily request, consent and authorize [facility] and its employed or contracted physicians, physician assistants, nurse practitioners or other licensed health care professionals (the Practitioner), to provide me with telemedicine health care services (the "Services") as deemed necessary by the treating Practitioner. I acknowledge and consent to receive the Services by the Practitioner via telemedicine. I understand that the telemedicine visit will involve communicating with the Practitioner through live audiovisual communication technology and the disclosure of certain medical information by electronic transmission. I acknowledge that I have been given the opportunity to request an in-person assessment or other available alternative prior to the telemedicine visit and am voluntarily participating in the telemedicine visit.  I understand that I have the right to withhold or withdraw my consent to the use of telemedicine in the course of my care at any time, without affecting my right to future care or treatment, and that the Practitioner or I may terminate the telemedicine visit at any time. I understand that I have the right to inspect all information obtained and/or recorded in the course of the telemedicine visit and may receive copies of available information for a reasonable fee.  I understand that some of the potential risks of receiving the Services via telemedicine include:  Delay or interruption in medical evaluation due to technological equipment failure or disruption; Information transmitted may not be sufficient (e.g. poor resolution of images) to allow for appropriate  medical decision making by the Practitioner; and/or  In rare instances, security protocols could fail, causing a breach of personal health information.  Furthermore, I acknowledge that it is my responsibility to provide information about my medical history, conditions and care that is complete and accurate to the best of my ability. I acknowledge that Practitioner's advice, recommendations, and/or decision may be based on factors not within their control, such as incomplete or inaccurate data provided by me or distortions of diagnostic images or specimens that may result from electronic transmissions. I understand that the practice of medicine is not an exact science and that Practitioner makes no warranties or guarantees regarding treatment outcomes. I acknowledge that a copy of this consent can be made available to me via my patient portal [facility] MyChart), or I can request a printed copy by calling the office of  [facility].    I understand that my insurance will be billed for this visit.   I have read or had this consent read to me. I understand the contents of this consent, which adequately explains the benefits and risks of the Services being provided via telemedicine.  I have been provided ample opportunity to ask questions regarding this consent and the Services and have had my questions answered to my satisfaction. I give my informed consent for the services to be provided through the use of telemedicine in my medical care

## 2024-05-26 ENCOUNTER — Other Ambulatory Visit

## 2024-05-26 LAB — URINALYSIS, COMPLETE
Bilirubin, UA: NEGATIVE
Glucose, UA: NEGATIVE
Ketones, UA: NEGATIVE
Leukocytes,UA: NEGATIVE
Nitrite, UA: NEGATIVE
RBC, UA: NEGATIVE
Specific Gravity, UA: 1.025 (ref 1.005–1.030)
Urobilinogen, Ur: 0.2 mg/dL (ref 0.2–1.0)
pH, UA: 6 (ref 5.0–7.5)

## 2024-05-26 LAB — MICROSCOPIC EXAMINATION: Epithelial Cells (non renal): >10 /HPF — AB (ref 0–10)

## 2024-05-30 ENCOUNTER — Encounter
Admission: RE | Admit: 2024-05-30 | Discharge: 2024-05-30 | Disposition: A | Discharge status: Home / Self Care | Source: Ambulatory Visit | Attending: Urology | Admitting: Urology

## 2024-05-30 ENCOUNTER — Other Ambulatory Visit: Payer: Self-pay

## 2024-05-30 DIAGNOSIS — I5189 Other ill-defined heart diseases: Secondary | ICD-10-CM

## 2024-05-30 DIAGNOSIS — I10 Essential (primary) hypertension: Secondary | ICD-10-CM

## 2024-05-30 DIAGNOSIS — K922 Gastrointestinal hemorrhage, unspecified: Secondary | ICD-10-CM

## 2024-05-30 DIAGNOSIS — I214 Non-ST elevation (NSTEMI) myocardial infarction: Secondary | ICD-10-CM

## 2024-05-30 NOTE — Patient Instructions (Addendum)
 Your procedure is scheduled on: 06/06/24 - Tuesday Report to the Registration Desk on the 1st floor of the [facility]. To find out your arrival time, please call [phone] between 1PM - 3PM on: 06/05/24 - Monday If your arrival time is 6:00 am, do not arrive before that time as the Medical Mall entrance doors do not open until 6:00 am.  REMEMBER: Instructions that are not followed completely may result in serious medical risk, up to and including death; or upon the discretion of your surgeon and anesthesiologist your surgery may need to be rescheduled.  Do not eat food or drink any liquids after midnight the night before surgery.  No gum chewing or hard candies.  One week prior to surgery: Stop Anti-inflammatories (NSAIDS) such as Advil, Aleve, Ibuprofen, Motrin, Naproxen, Naprosyn and Aspirin  based products such as Excedrin, Goody's Powder, BC Powder. You may take Tylenol  if needed for pain up until the day of surgery.  Stop ANY OVER THE COUNTER supplements until after surgery.  HOLD aspirin  EC 3 days before your procedure beginning 06/03/24.  Continue taking all of your other prescription medications up until the day before surgery.  ON THE DAY OF SURGERY ONLY TAKE THESE MEDICATIONS WITH SIPS OF WATER :  amLODipine  (NORVASC )  atenolol  (TENORMIN )  solifenacin  (VESICARE )    No Alcohol  for 24 hours before or after surgery.  No Smoking including e-cigarettes for 24 hours before surgery.  No chewable tobacco products for at least 6 hours before surgery.  No nicotine patches on the day of surgery.  Do not use any "recreational" drugs for at least a week (preferably 2 weeks) before your surgery.  Please be advised that the combination of cocaine and anesthesia may have negative outcomes, up to and including death. If you test positive for cocaine, your surgery will be cancelled.  On the morning of surgery brush your teeth with toothpaste and water , you may rinse your mouth with  mouthwash if you wish. Do not swallow any toothpaste or mouthwash.  Do not wear jewelry, make-up, hairpins, clips or nail polish.  For welded (permanent) jewelry: bracelets, anklets, waist bands, etc.  Please have this removed prior to surgery.  If it is not removed, there is a chance that hospital personnel will need to cut it off on the day of surgery.  Do not wear lotions, powders, or perfumes.   Do not shave body hair from the neck down 48 hours before surgery.  Contact lenses, hearing aids and dentures may not be worn into surgery.  Do not bring valuables to the hospital. [facility] is not responsible for any missing/lost belongings or valuables.   Notify your doctor if there is any change in your medical condition (cold, fever, infection).  Wear comfortable clothing (specific to your surgery type) to the hospital.  After surgery, you can help prevent lung complications by doing breathing exercises.  Take deep breaths and cough every 1-2 hours. Your doctor may order a device called an Incentive Spirometer to help you take deep breaths. When coughing or sneezing, hold a pillow firmly against your incision with both hands. This is called "splinting." Doing this helps protect your incision. It also decreases belly discomfort.  If you are being admitted to the hospital overnight, leave your suitcase in the car. After surgery it may be brought to your room.  In case of increased patient census, it may be necessary for you, the patient, to continue your postoperative care in the Same Day Surgery department.  If you are being discharged the day of surgery, you will not be allowed to drive home. You will need a responsible individual to drive you home and stay with you for 24 hours after surgery.   If you are taking public transportation, you will need to have a responsible individual with you.  Please call the Pre-admissions Testing Dept. at [phone] if you have any questions  about these instructions.  Surgery Visitation Policy:  Patients having surgery or a procedure may have two visitors.  Children under the age of 20 must have an adult with them who is not the patient.  Inpatient Visitation:    Visiting hours are 7 a.m. to 8 p.m. Up to four visitors are allowed at one time in a patient room. The visitors may rotate out with other people during the day.  One visitor age 65 or older may stay with the patient overnight and must be in the room by 8 p.m.

## 2024-05-31 ENCOUNTER — Ambulatory Visit: Attending: Cardiovascular Disease | Admitting: Nurse Practitioner

## 2024-05-31 ENCOUNTER — Encounter: Payer: Self-pay | Admitting: Nurse Practitioner

## 2024-05-31 DIAGNOSIS — Z01810 Encounter for preprocedural cardiovascular examination: Secondary | ICD-10-CM | POA: Diagnosis not present

## 2024-05-31 LAB — CULTURE, URINE COMPREHENSIVE

## 2024-05-31 NOTE — Progress Notes (Signed)
 Virtual Visit via Telephone Note   Because of Gregory [name] co-morbid illnesses, he is at least at moderate risk for complications without adequate follow up.  This format is felt to be most appropriate for this patient at this time.  Due to technical limitations with video connection (technology), today's appointment will be conducted as an audio only telehealth visit, and Gregory [name] verbally agreed to proceed in this manner.   All issues noted in this document were discussed and addressed.  No physical exam could be performed with this format.  Evaluation Performed:  Preoperative cardiovascular risk assessment _____________   Date:  05/31/2024   Patient ID:  Gregory [name], DOB Feb 28, 1941, MRN [id number] Patient Location:  Home Provider location:   Office  Primary Care Provider:  Antonio Baumgarten, MD Primary Cardiologist:  Belva Boyden, MD  Chief Complaint / Patient Profile   83 y.o. y/o male with a h/o CAD with occluded PDA with collateral circulation, and mild diffuse disease of LAD recommended for medical therapy, HTN, obesity, GI bleed, acoustic neuroma, HLD, type 2 diabetes who is pending TURBT with Dr. Cherylene Corrente on 06/06/24 and presents today for telephonic preoperative cardiovascular risk assessment.  History of Present Illness    Gregory [name] is a 83 y.o. male who presents via audio/video conferencing for a telehealth visit today.  Pt was last seen in cardiology clinic on 06/29/23 by Dr. Gollan.  At that time Gregory [name] was doing well.  The patient is now pending procedure as outlined above. Since his last visit, he  denies chest pain, shortness of breath, lower extremity edema, fatigue, palpitations, melena, hematuria, hemoptysis, diaphoresis, weakness, presyncope, syncope, orthopnea, and PND. He remains active as a Psychologist, sport and exercise and is able to achieve > 4 METS Activity without concerning cardiac symptoms.    Past Medical History    Past  Medical History:  Diagnosis Date   Actinic keratosis    Aortic root dilatation (HCC) 10/06/2017   a.) TTE 10/06/2017: Ao root 41mm.   Arthritis    Basal cell carcinoma 08/21/2009   Left neck. Excised: 10/03/2009, margins free.   Coronary artery disease 10/06/2017   a.) LHC 10/06/2017: 60% D2, 100% 2nd RPLB, 60% 3rd RPLB, 40% pLAD, 40% mLAD, 60% OM1; Rx mgmt.   Diastolic dysfunction 10/06/2017   a.) TTE 10/06/2017: EF 55-60%; G1DD.   Dysplastic nevus 08/23/2008   Right vertex. Moderate to severe atypia, bordering on early evolving MMis.   Dysplastic nevus 02/21/2010   Right vertex. Moderate to severe atypia, bordering on early evolving MMis.   Dysplastic nevus 03/14/2023   Right mid back. Mild atypia, limited margins free.   GERD (gastroesophageal reflux disease)    Grade I diastolic dysfunction    History of acoustic neuroma 02/2004   a.) s/p crainotomy for removal --> complicated by CSF leak requiring 2nd crainotomy for internatal auditory canal closure, closure or dura, and placement of lumbar drain on 06/13/2004   History of craniotomy    History of kidney stones    HLD (hyperlipidemia)    Hypertension    Left-sided weakness    Residual from brain tumor- removed 2005   Loss of equilibrium    left side only due to benign brain stem tumor in 2005   Melanoma in situ (HCC) 2019   right vertex scalp   NSTEMI (non-ST elevated myocardial infarction) (HCC) 10/06/2017   a.) LHC 10/06/2017: 60% D2, 100% 2nd RPLB, 60% 3rd RPLB, 40% pLAD, 40% mLAD, 60%  OM1; intervention deferred opting for medical management   OSA (obstructive sleep apnea)    a.) does not require nocturnal PAP therapy   Prostate cancer (HCC)    Rheumatic fever    Squamous cell carcinoma of skin 07/27/2018   Right posterior ear. [facility]   Past Surgical History:  Procedure Laterality Date   BLADDER SURGERY  12/21/2010   Dr. Michaeleen Adler, [facility]   CATARACT EXTRACTION W/PHACO Left 04/05/2024   Procedure: CATARACT  EXTRACTION PHACO AND INTRAOCULAR LENS PLACEMENT (IOC) LEFT MALYUGIN 10.62 00:43.5;  Surgeon: Annell Kidney, MD;  Location: [facility];  Service: Ophthalmology;  Laterality: Left;   CHOLECYSTECTOMY     COLONOSCOPY WITH PROPOFOL  N/A 03/05/2023   Procedure: COLONOSCOPY WITH PROPOFOL ;  Surgeon: Shane Darling, MD;  Location: [facility];  Service: Endoscopy;  Laterality: N/A;   CRANIOTOMY Left 02/2004   Procedure: CRANIOTOMY FOR ACOUSTIC NEUROMA REMOVAL; Location: [facility]   CRANIOTOMY FOR REPAIR DURAL / CSF LEAK N/A 06/13/2004   Procedure: SECONDARY SUBOCCIPITAL CRANIOTOMY WITH CLOSURE OF INTERNAL AUDITORY CANAL, CLOSURE OF DURA, AND PLACEMENT OF LUMBAR DRAIN; Location: [facility]   CYSTOSCOPY WITH RETROGRADE URETHROGRAM  03/16/2016   Procedure: CYSTOSCOPY WITH RETROGRADE URETHROGRAM;  Surgeon: Dustin Gimenez, MD;  Location: [facility];  Service: Urology;;   CYSTOSCOPY WITH STENT PLACEMENT Left 02/23/2016   Procedure: CYSTOSCOPY WITH STENT PLACEMENT;  Surgeon: Rodell Citrin, MD;  Location: [facility];  Service: Urology;  Laterality: Left;   CYSTOSCOPY WITH STENT PLACEMENT Left 03/16/2016   Procedure: CYSTOSCOPY WITH STENT PLACEMENT/EXCHANGE;  Surgeon: Dustin Gimenez, MD;  Location: [facility];  Service: Urology;  Laterality: Left;   LEFT HEART CATH AND CORONARY ANGIOGRAPHY N/A 10/06/2017   Procedure: LEFT HEART CATH AND CORONARY ANGIOGRAPHY;  Surgeon: Devorah Fonder, MD;  Location: [facility];  Service: Cardiovascular;  Laterality: N/A;   MELANOMA EXCISION     PROSTATECTOMY  12/21/2001   [facility]. Michaeleen Adler, [facility]   SHOULDER ARTHROSCOPY WITH SUBACROMIAL DECOMPRESSION, ROTATOR CUFF REPAIR AND BICEP TENDON REPAIR Left 02/05/2022   Procedure: SHOULDER ARTHROSCOPY WITH EXTENSIVE DEBRIDEMENT, DECOMPRESSION,;  Surgeon: Elner Hahn, MD;  Location: [facility];  Service: Orthopedics;  Laterality: Left;   STONE EXTRACTION WITH BASKET  03/16/2016   Procedure: STONE EXTRACTION WITH BASKET;   Surgeon: Dustin Gimenez, MD;  Location: [facility];  Service: Urology;;    Allergies  Allergies  Allergen Reactions   Iodine Anaphylaxis   Shellfish Allergy Anaphylaxis    Home Medications    Prior to Admission medications   Medication Sig Start Date End Date Taking? Authorizing Provider  amLODipine  (NORVASC ) 10 MG tablet TAKE 1 TABLET EVERY MORNING 09/21/23   Gollan, Timothy J, MD  aspirin  EC 81 MG tablet Take 1 tablet (81 mg total) by mouth daily. Swallow whole. 03/24/23   Ronald Cockayne, NP  atenolol  (TENORMIN ) 25 MG tablet Take by mouth daily.    [provider]  atorvastatin  (LIPITOR) 40 MG tablet TAKE 1 TABLET EVERY DAY 09/21/23   Gollan, Timothy J, MD  latanoprost (XALATAN) 0.005 % ophthalmic solution Place 1 drop into both eyes at bedtime. 12/29/22   [provider]  nitroGLYCERIN  (NITROSTAT ) 0.4 MG SL tablet Place 1 tablet (0.4 mg total) under the tongue every 5 (five) minutes as needed for chest pain (hold for blood pressure less than 90). 10/07/17   Alexis Anger, MD  solifenacin  (VESICARE ) 5 MG tablet Take 1 tablet (5 mg total) by mouth daily. 03/30/24   Dustin Gimenez, MD  Physical Exam    Vital Signs:  Neema Fluegge Walthers does not have vital signs available for review today.  Given telephonic nature of communication, physical exam is limited. AAOx3. NAD. Normal affect.  Speech and respirations are unlabored.  Accessory Clinical Findings    None  Assessment & Plan    1.  Preoperative Cardiovascular Risk Assessment: According to the Revised Cardiac Risk Index (RCRI), his Perioperative Risk of Major Cardiac Event is (%): 6.6. His Functional Capacity in METs is: 7.25 according to the [facility] Activity Status Index (DASI). The patient is doing well from a cardiac perspective. Therefore, based on ACC/AHA guidelines, the patient would be at acceptable risk for the planned procedure without further cardiovascular testing.   The patient was advised that if he  develops new symptoms prior to surgery to contact our office to arrange for a follow-up visit, and he verbalized understanding.  Ideally aspirin  should be continued without interruption, however if the bleeding risk is too great, aspirin  may be held for 5-7 days prior to surgery. Please resume aspirin  post operatively when it is felt to be safe from a bleeding standpoint.    A copy of this note will be routed to requesting surgeon.  Time:   Today, I have spent 10 minutes with the patient with telehealth technology discussing medical history, symptoms, and management plan.    Gerldine Koch, NP-C  05/31/2024, 1:45 PM [address], [address] [place], [place] [address] Office [phone] Fax [phone]

## 2024-06-01 ENCOUNTER — Inpatient Hospital Stay: Admission: RE | Admit: 2024-06-01 | Source: Ambulatory Visit

## 2024-06-01 ENCOUNTER — Other Ambulatory Visit: Payer: Self-pay | Admitting: Urology

## 2024-06-01 DIAGNOSIS — D494 Neoplasm of unspecified behavior of bladder: Secondary | ICD-10-CM

## 2024-06-01 DIAGNOSIS — R8271 Bacteriuria: Secondary | ICD-10-CM

## 2024-06-02 ENCOUNTER — Telehealth: Payer: Self-pay

## 2024-06-02 NOTE — Telephone Encounter (Signed)
 Patient is growing bacteria in Urine Culture. Needs ID appointment. Advised patient we will move surgery to 06/20/24 and he will see ID on 06/12/24. Will call and update patient with any changes to this schedule. Patient verbalized understanding.

## 2024-06-07 ENCOUNTER — Encounter
Admission: RE | Admit: 2024-06-07 | Discharge: 2024-06-07 | Disposition: A | Discharge status: Home / Self Care | Source: Ambulatory Visit | Attending: Urology | Admitting: Urology

## 2024-06-07 DIAGNOSIS — I10 Essential (primary) hypertension: Secondary | ICD-10-CM

## 2024-06-07 DIAGNOSIS — R9431 Abnormal electrocardiogram [ECG] [EKG]: Secondary | ICD-10-CM | POA: Diagnosis not present

## 2024-06-07 DIAGNOSIS — Z01818 Encounter for other preprocedural examination: Secondary | ICD-10-CM | POA: Insufficient documentation

## 2024-06-07 DIAGNOSIS — I5189 Other ill-defined heart diseases: Secondary | ICD-10-CM | POA: Diagnosis present

## 2024-06-07 DIAGNOSIS — I119 Hypertensive heart disease without heart failure: Secondary | ICD-10-CM | POA: Diagnosis not present

## 2024-06-07 DIAGNOSIS — Z01812 Encounter for preprocedural laboratory examination: Secondary | ICD-10-CM | POA: Diagnosis present

## 2024-06-07 DIAGNOSIS — I214 Non-ST elevation (NSTEMI) myocardial infarction: Secondary | ICD-10-CM | POA: Diagnosis not present

## 2024-06-07 DIAGNOSIS — Z01810 Encounter for preprocedural cardiovascular examination: Secondary | ICD-10-CM | POA: Diagnosis present

## 2024-06-07 LAB — CBC
HCT: 46.6 % (ref 39.0–52.0)
Hemoglobin: 15.9 g/dL (ref 13.0–17.0)
MCH: 30.2 pg (ref 26.0–34.0)
MCHC: 34.1 g/dL (ref 30.0–36.0)
MCV: 88.4 fL (ref 80.0–100.0)
Platelets: 297 10*3/uL (ref 150–400)
RBC: 5.27 MIL/uL (ref 4.22–5.81)
RDW: 14.5 % (ref 11.5–15.5)
WBC: 10.3 10*3/uL (ref 4.0–10.5)
nRBC: 0 % (ref 0.0–0.2)

## 2024-06-07 LAB — BASIC METABOLIC PANEL WITH GFR
Anion gap: 9 (ref 5–15)
BUN: 23 mg/dL (ref 8–23)
CO2: 25 mmol/L (ref 22–32)
Calcium: 8.7 mg/dL — ABNORMAL LOW (ref 8.9–10.3)
Chloride: 105 mmol/L (ref 98–111)
Creatinine, Ser: 1.31 mg/dL — ABNORMAL HIGH (ref 0.61–1.24)
GFR, Estimated: 55 mL/min — ABNORMAL LOW (ref >60)
Glucose, Bld: 132 mg/dL — ABNORMAL HIGH (ref 70–99)
Potassium: 3.8 mmol/L (ref 3.5–5.1)
Sodium: 139 mmol/L (ref 135–145)

## 2024-06-12 ENCOUNTER — Other Ambulatory Visit: Payer: Self-pay

## 2024-06-12 ENCOUNTER — Encounter: Payer: Self-pay | Admitting: Internal Medicine

## 2024-06-12 ENCOUNTER — Ambulatory Visit: Admitting: Internal Medicine

## 2024-06-12 VITALS — BP 150/76 | HR 59 | Temp 97.5°F | Ht 68.0 in | Wt 201.0 lb

## 2024-06-12 DIAGNOSIS — R8271 Bacteriuria: Secondary | ICD-10-CM

## 2024-06-12 NOTE — Progress Notes (Signed)
 [facility]  Reason for Consult:bacteriuria; urological surgery clearance Referring Provider: Glendia Barba    Patient Active Problem List   Diagnosis Date Noted   Acute lower GI bleeding 03/05/2023   Diastolic dysfunction 03/04/2023   Deviated septum 07/25/2019   Nasal obstruction 07/25/2019   Nasal valve collapse 07/25/2019   Obstructive sleep apnea 07/25/2019   Coronary artery disease 11/14/2018   Morbid obesity (HCC) 11/14/2018   Melanoma in situ (HCC) 2019   Non-ST elevation (NSTEMI) myocardial infarction (HCC) 10/06/2017   HLD (hyperlipidemia) 10/06/2017   Hyperglycemia 10/06/2017   Leukocytosis 10/06/2017   Non Q wave myocardial infarction (HCC) 10/06/2017   Syncope 06/05/2016   Calculus of kidney 04/20/2016   Acoustic neuroma (HCC) 03/24/2016   Allergy 03/24/2016   Essential hypertension 03/24/2016   Left ureteral stone 02/22/2016   Kidney stone    Left flank pain    Malignant neoplasm of prostate (HCC) 03/04/2015   H/O Malignant melanoma 03/04/2015      HPI: Gregory [name] is a 83 y.o. male pmh bph/prostate cancer s/p TURP 2003, hx kidney stone, scalp melenoma s/p resection clear margin, CAD, htn,referred by his urologist for periop evaluation/clearance prior to TURP in the setting of a bacteria growth on preop-culture  Patient is unclear why he is here Reviewed referral note and ?patient appeared to be here due to an unusual organism on culture prior to a urological procedure   Patient said he never was treated for uti Prior prostate cancer and kidney stone; distant hx turp  Current urological procedure planned for 7/1  Reviewed prior urine cx (see a&p)  He is not or has been on recent antibiotics  He has no complaint today  Review of Systems: ROS All other ros negative      Past Medical History:  Diagnosis Date   Actinic keratosis    Aortic root dilatation (HCC) 10/06/2017   a.) TTE 10/06/2017:  Ao root 41mm.   Arthritis    Basal cell carcinoma 08/21/2009   Left neck. Excised: 10/03/2009, margins free.   Coronary artery disease 10/06/2017   a.) LHC 10/06/2017: 60% D2, 100% 2nd RPLB, 60% 3rd RPLB, 40% pLAD, 40% mLAD, 60% OM1; Rx mgmt.   Diastolic dysfunction 10/06/2017   a.) TTE 10/06/2017: EF 55-60%; G1DD.   Dysplastic nevus 08/23/2008   Right vertex. Moderate to severe atypia, bordering on early evolving MMis.   Dysplastic nevus 02/21/2010   Right vertex. Moderate to severe atypia, bordering on early evolving MMis.   Dysplastic nevus 03/14/2023   Right mid back. Mild atypia, limited margins free.   GERD (gastroesophageal reflux disease)    Grade I diastolic dysfunction    History of acoustic neuroma 02/2004   a.) s/p crainotomy for removal --> complicated by CSF leak requiring 2nd crainotomy for internatal auditory canal closure, closure or dura, and placement of lumbar drain on 06/13/2004   History of craniotomy    History of kidney stones    HLD (hyperlipidemia)    Hypertension    Left-sided weakness    Residual from brain tumor- removed 2005   Loss of equilibrium    left side only due to benign brain stem tumor in 2005   Melanoma in situ (HCC) 2019   right vertex scalp   NSTEMI (non-ST elevated myocardial infarction) (HCC) 10/06/2017   a.) LHC 10/06/2017: 60% D2, 100% 2nd RPLB, 60% 3rd RPLB, 40% pLAD, 40% mLAD, 60% OM1; intervention deferred opting for  medical management   OSA (obstructive sleep apnea)    a.) does not require nocturnal PAP therapy   Prostate cancer (HCC)    Rheumatic fever    Squamous cell carcinoma of skin 07/27/2018   Right posterior ear. WD SCC. EDC    Social History   Tobacco Use   Smoking status: Never   Smokeless tobacco: Never  Vaping Use   Vaping status: Never Used  Substance Use Topics   Alcohol  use: No   Drug use: No    Family History  Problem Relation Age of Onset   CAD Father    Polycystic kidney disease Father     Diabetes Mellitus II Neg Hx    Hypertension Neg Hx     Allergies  Allergen Reactions   Iodine Anaphylaxis   Shellfish Allergy Anaphylaxis    OBJECTIVE: Vitals:   06/12/24 1302  Weight: 201 lb (91.2 kg)  Height: 5' 8 (1.727 m)   Body mass index is 30.56 kg/m.   Physical Exam General/constitutional: no distress, pleasant HEENT: Normocephalic, PER, Conj Clear, EOMI, Oropharynx clear Neck supple CV: rrr no mrg Lungs: clear to auscultation, normal respiratory effort Abd: Soft, Nontender Ext: no edema Skin: No Rash Neuro: nonfocal MSK: no peripheral joint swelling/tenderness/warmth; back spines nontender   Lab: Lab Results  Component Value Date   WBC 10.3 06/07/2024   HGB 15.9 06/07/2024   HCT 46.6 06/07/2024   MCV 88.4 06/07/2024   PLT 297 06/07/2024   Last metabolic panel Lab Results  Component Value Date   GLUCOSE 132 (H) 06/07/2024   NA 139 06/07/2024   K 3.8 06/07/2024   CL 105 06/07/2024   CO2 25 06/07/2024   BUN 23 06/07/2024   CREATININE 1.31 (H) 06/07/2024   GFRNONAA 55 (L) 06/07/2024   CALCIUM  8.7 (L) 06/07/2024   PROT 6.2 (L) 03/05/2023   ALBUMIN 3.4 (L) 03/05/2023   BILITOT 0.9 03/05/2023   ALKPHOS 57 03/05/2023   AST 26 03/05/2023   ALT 26 03/05/2023   ANIONGAP 9 06/07/2024    Microbiology: See cultures below   Serology:  Imaging:   Assessment/plan: Problem List Items Addressed This Visit   None Visit Diagnoses       Asymptomatic bacteriuria    -  Primary      83 yo pmh bph/prostate cancer s/p TURP 2003, hx kidney stone, scalp melenoma s/p resection clear margin, CAD, htn,referred by his urologist for periop evaluation/clearance prior to TURP in the setting of a bacteria growth on preop-culture   Patient never been treated for UTI   Procedure has been delayed and currently scheduled for July 1st 2025   Ucx  05/25/2024 1) 10-25k alloscardiovia Omnicolens (gpr) assymptomatic bacteriuria. Not a common organism for uti and  likely identified by maldi-tof 2) ecoli susceptibility not worked up due to low cfu count 06/2022 ecoli sensitive to cipro /flagyl/cefazolin /amp 02/2021 ecoli pan-sensitive; proteus mirabilis (R nitrofurantoin)   From literature review the antibiotics susceptibility to quinolone is variable for alloscardiovia so we'll advise betalactam such as ceftriaxone  an hour preoperative prophylaxis   Patient without hx uti and currently without sx of uti. No current antibiotics indicated  I see a sheet with some slots for recommendation -- filled out periop abx option and will fax back  Chart also sent to dr Glendia Barba electronically   Follow-up: No follow-ups on file.  Constance ONEIDA Passer, MD [facility] 06/12/2024, 1:05 PM

## 2024-06-12 NOTE — Patient Instructions (Signed)
 You have a rare organism growing in your urine which is not doing any harm at this time   Before your urologist do a procedure for you, the team can give you a dose of antibiotics prophylaxis within an hour of procedure.  Avoid fluoroquinolone Use beta-lactam such as ampicillin -sulbactam or ceftriaxone     No need for any ongoing antibiotics at this time

## 2024-06-19 ENCOUNTER — Encounter: Payer: Self-pay | Admitting: Urology

## 2024-06-19 MED ORDER — ORAL CARE MOUTH RINSE: 15.0000 mL | Freq: Once | OROMUCOSAL | Status: AC

## 2024-06-19 MED ORDER — SODIUM CHLORIDE 0.9 % IV SOLN
2.0000 g | Freq: Once | INTRAVENOUS | Status: AC
  Administered 2024-06-20: 2 g via INTRAVENOUS
  Filled 2024-06-19: qty 20

## 2024-06-19 MED ORDER — CHLORHEXIDINE GLUCONATE 0.12 % MT SOLN
15.0000 mL | Freq: Once | OROMUCOSAL | Status: AC
Start: 2024-06-19 — End: 2024-06-20
  Administered 2024-06-20: 15 mL via OROMUCOSAL

## 2024-06-19 MED ORDER — LACTATED RINGERS IV SOLN: INTRAVENOUS | Status: DC

## 2024-06-19 MED ORDER — GEMCITABINE CHEMO FOR BLADDER INSTILLATION 2000 MG
2000.0000 mg | Freq: Once | INTRAVENOUS | Status: DC
  Filled 2024-06-19: qty 52.6

## 2024-06-19 NOTE — Progress Notes (Signed)
 Perioperative / Anesthesia Services  Pre-Admission Testing Clinical Review / Pre-Operative Anesthesia Consult  Date: 06/19/24  PATIENT DEMOGRAPHICS: Name: Gregory [name] DOB: 05-17-1941 MRN:   [id number]  Note: Available PAT nursing documentation and vital signs have been reviewed. Clinical nursing staff has updated patient's PMH/PSHx, current medication list, and drug allergies/intolerances to ensure complete and comprehensive history available to assist care teams in MDM as it pertains to the aforementioned surgical procedure and anticipated anesthetic course. Extensive review of available clinical information personally performed. [place] [facility] and PSHx updated with any diagnoses/procedures that  may have been inadvertently omitted during his intake with the pre-admission testing department's nursing staff.  PLANNED SURGICAL PROCEDURE(S):   Case: [id number] Date/Time: 06/20/24 0715   Procedures:      TURBT (TRANSURETHRAL RESECTION OF BLADDER TUMOR)     CYSTOSCOPY, WITH RETROGRADE PYELOGRAM (Bilateral)     INSTILLATION, BLADDER - GEMCITABINE   Anesthesia type: Choice   Diagnosis: Bladder tumor [D49.4]   Pre-op diagnosis: Bladder Tumor   Location: [facility] OR ROOM 10 / [facility] FOR AN[facility] GROUP   Surgeons: Twylla Glendia BROCKS, MD        CLINICAL DISCUSSION: Gregory [name] is a 83 y.o. male who is submitted for pre-surgical anesthesia review and clearance prior to him undergoing the above procedure. Patient has never been a smoker in the past. Pertinent PMH includes: CAD, NSTEMI, aortic root dilatation, diastolic dysfunction, aortic atherosclerosis, HTN, HLD, acoustic neuroma (s/p resection), OSAH (does not require nocturnal PAP therapy), GERD (no daily Tx), OA, nephrolithiasis, remote prostate cancer, bladder tumor.   Patient is followed by cardiology (Gollan, MD). He was last seen in the cardiology clinic on 06/29/2023; notes reviewed. At the time of his clinic visit, patient  doing well overall from a cardiovascular perspective. Patient denied any chest pain, shortness of breath, PND, orthopnea, palpitations, significant peripheral edema, weakness, fatigue, vertiginous symptoms, or presyncope/syncope. Patient with a past medical history significant for cardiovascular diagnoses. Documented physical exam was grossly benign, providing no evidence of acute exacerbation and/or decompensation of the patient's known cardiovascular conditions.  Patient suffered an NSTEMI on 10/06/2017.  Diagnostic left heart catheterization was performed revealing multivessel CAD; 60% D2, 100% 2nd RPLB, 60% 3rd RPLB, 40% proximal LAD, 40% mid LAD, and 60% OM1.  Intervention was deferred opting for medical management.   Most recent TTE performed on 10/06/2017 revealed a normal left ventricular systolic function with an EF of 55-60%. There were no regional wall motion abnormalities. Left ventricular diastolic Doppler parameters consistent with abnormal relaxation (G1DD). Right ventricular size and function normal with a TAPSE measuring 2.6 cm  (normal range >/= 1.6 cm). There was trivial tricuspid valve regurgitation.  All transvalvular gradients were noted to be normal providing no evidence of hemodynamically significant valvular stenosis. Aortic root noted to be mildly dilated at 41 mm.  Blood pressure well controlled at 124/82 mmHg on currently prescribed CCB (amlodipine ) and beta-blocker (atenolol ) therapies.  Patient is on atorvastatin  for his HLD diagnosis and ASCVD prevention.  Patient has a supply of short acting nitrates (NTG) to use on a as needed basis for recurrent angina/anginal equivalent symptoms; denied recent use. He does have an OSAH diagnosis, however does not require the use of nocturnal PAP therapy. Patient is able to complete all of his  ADL/IADLs without cardiovascular limitation.  Per the DASI, patient is able to achieve at least 4 METS of physical activity without experiencing any  significant degree of angina/anginal equivalent symptoms. No changes were made  to his medication regimen during his visit with cardiology.  Patient scheduled to follow-up with outpatient cardiology in 1 year or sooner if needed.  Gregory [name] is scheduled for an elective TURBT (TRANSURETHRAL RESECTION OF BLADDER TUMOR); CYSTOSCOPY, WITH RETROGRADE PYELOGRAM (Bilateral); INSTILLATION, BLADDER on 06/20/2024 with Dr. Glendia Barba, MD.  Given patient's past medical history significant for cardiovascular diagnoses, presurgical cardiac clearance was sought by the PAT team. Per cardiology, according to the Revised Cardiac Risk Index (RCRI), his Perioperative Risk of Major Cardiac Event is (%): 6.6. His Functional Capacity in METs is: 7.25 according to the [facility] Activity Status Index (DASI). The patient is doing well from a cardiac perspective. Therefore, based on ACC/AHA guidelines, the patient would be at ACCEPTABLE risk for the planned procedure without further cardiovascular testing.   In review of the patient's chart, it is noted that he is on daily oral antithrombotic therapy. He has been instructed on recommendations for holding his daily low dose ASA for 3 days prior to his procedure with plans to restart as soon as postoperative bleeding risk felt to be minimized by his primary attending surgeon. The patient has been instructed that his last dose of should be on 06/16/2024.  Patient denies previous perioperative complications with anesthesia in the past. In review his EMR, it is noted that patient underwent a general anesthetic course at [facility] ([facility]) in 03/2024 without documented complications.   MOST RECENT VITAL SIGNS:    06/12/2024    1:02 PM 05/24/2024   11:28 AM 05/16/2024   10:05 AM  Vitals with BMI  Height 5' 8    Weight 201 lbs    BMI 30.57    Systolic 150 146 855  Diastolic 76 71 79  Pulse 59 66 63   PROVIDERS/SPECIALISTS: NOTE: Primary physician provider  listed below. Patient may have been seen by APP or partner within same practice.   PROVIDER ROLE / SPECIALTY LAST SHERLEAN Barba Glendia JAYSON, MD Urology (Surgeon) 05/24/2024  Sadie Manna, MD Primary Care Provider 01/31/2024  Perla Lye, MD Cardiology 06/29/2023; preop APP call 05/31/2024   ALLERGIES: Allergies  Allergen Reactions   Iodine Anaphylaxis   Shellfish Allergy Anaphylaxis    CURRENT HOME MEDICATIONS: No current facility-administered medications for this encounter.    amLODipine  (NORVASC ) 10 MG tablet   aspirin  EC 81 MG tablet   atenolol  (TENORMIN ) 25 MG tablet   atorvastatin  (LIPITOR) 40 MG tablet   latanoprost (XALATAN) 0.005 % ophthalmic solution   nitroGLYCERIN  (NITROSTAT ) 0.4 MG SL tablet   solifenacin  (VESICARE ) 5 MG tablet   HISTORY: Past Medical History:  Diagnosis Date   Actinic keratosis    Aortic atherosclerosis (HCC)    Aortic root dilatation (HCC) 10/06/2017   a.) TTE 10/06/2017: Ao root 41mm.   Arthritis    Basal cell carcinoma 08/21/2009   Left neck. Excised: 10/03/2009, margins free.   Bladder tumor    Coronary artery disease 10/06/2017   a.) LHC 10/06/2017: 60% D2, 100% 2nd RPLB, 60% 3rd RPLB, 40% pLAD, 40% mLAD, 60% OM1 - med mgmt   Diastolic dysfunction 10/06/2017   Diverticulosis    Dysplastic nevus 08/23/2008   Right vertex. Moderate to severe atypia, bordering on early evolving MMis.   Dysplastic nevus 02/21/2010   Right vertex. Moderate to severe atypia, bordering on early evolving MMis.   Dysplastic nevus 03/14/2023   Right mid back. Mild atypia, limited margins free.   GERD (gastroesophageal reflux disease)    GI bleeding 02/2023  History of acoustic neuroma 02/2004   a.) s/p crainotomy for removal --> complicated by CSF leak requiring 2nd crainotomy for internal auditory canal closure, closure or dura, and placement of lumbar drain on 06/13/2004   History of craniotomy    History of kidney stones    HLD (hyperlipidemia)     Hypertension    Left-sided weakness    Residual from brain tumor- removed 2005   Long-term use of aspirin  therapy    Melanoma in situ (HCC) 2019   right vertex scalp   NSTEMI (non-ST elevated myocardial infarction) (HCC) 10/06/2017   a.) LHC 10/06/2017: 60% D2, 100% 2nd RPLB, 60% 3rd RPLB, 40% pLAD, 40% mLAD, 60% OM1; intervention deferred opting for medical management   OSA (obstructive sleep apnea)    a.) does not require nocturnal PAP therapy   Prostate cancer (s/p resection)    Rheumatic fever    Squamous cell carcinoma of skin 07/27/2018   Right posterior ear. WD SCC. [facility]   Past Surgical History:  Procedure Laterality Date   BLADDER SURGERY  12/21/2010   Dr. Patrina, [facility]   CATARACT EXTRACTION W/PHACO Left 04/05/2024   Procedure: CATARACT EXTRACTION PHACO AND INTRAOCULAR LENS PLACEMENT (IOC) LEFT MALYUGIN 10.62 00:43.5;  Surgeon: Mittie Gaskin, MD;  Location: [facility];  Service: Ophthalmology;  Laterality: Left;   CHOLECYSTECTOMY     COLONOSCOPY WITH PROPOFOL  N/A 03/05/2023   Procedure: COLONOSCOPY WITH PROPOFOL ;  Surgeon: Maryruth Ole DASEN, MD;  Location: [facility] END[facility]COPY;  Service: Endoscopy;  Laterality: N/A;   CRANIOTOMY Left 02/2004   Procedure: CRANIOTOMY FOR ACOUSTIC NEUROMA REMOVAL; Location: [facility]   CRANIOTOMY FOR REPAIR DURAL / CSF LEAK N/A 06/13/2004   Procedure: SECONDARY SUBOCCIPITAL CRANIOTOMY WITH CLOSURE OF INTERNAL AUDITORY CANAL, CLOSURE OF DURA, AND PLACEMENT OF LUMBAR DRAIN; Location: [facility]   CYSTOSCOPY WITH RETROGRADE URETHROGRAM  03/16/2016   Procedure: CYSTOSCOPY WITH RETROGRADE URETHROGRAM;  Surgeon: Rosina Riis, MD;  Location: [facility] ORS;  Service: Urology;;   CYSTOSCOPY WITH STENT PLACEMENT Left 02/23/2016   Procedure: CYSTOSCOPY WITH STENT PLACEMENT;  Surgeon: Zina JINNY Irwin, MD;  Location: [facility] ORS;  Service: Urology;  Laterality: Left;   CYSTOSCOPY WITH STENT PLACEMENT Left 03/16/2016   Procedure: CYSTOSCOPY WITH STENT  PLACEMENT/EXCHANGE;  Surgeon: Rosina Riis, MD;  Location: [facility] ORS;  Service: Urology;  Laterality: Left;   LEFT HEART CATH AND CORONARY ANGIOGRAPHY N/A 10/06/2017   Procedure: LEFT HEART CATH AND CORONARY ANGIOGRAPHY;  Surgeon: Perla Evalene JINNY, MD;  Location: [facility] INVASIVE [facility] LAB;  Service: Cardiovascular;  Laterality: N/A;   MELANOMA EXCISION     PROSTATECTOMY  12/21/2001   Dr. Patrina, [facility]   SHOULDER ARTHROSCOPY WITH SUBACROMIAL DECOMPRESSION, ROTATOR CUFF REPAIR AND BICEP TENDON REPAIR Left 02/05/2022   Procedure: SHOULDER ARTHROSCOPY WITH EXTENSIVE DEBRIDEMENT, DECOMPRESSION,;  Surgeon: Edie Norleen JINNY, MD;  Location: [facility] ORS;  Service: Orthopedics;  Laterality: Left;   STONE EXTRACTION WITH BASKET  03/16/2016   Procedure: STONE EXTRACTION WITH BASKET;  Surgeon: Rosina Riis, MD;  Location: [facility] ORS;  Service: Urology;;   Family History  Problem Relation Age of Onset   CAD Father    Polycystic kidney disease Father    Diabetes Mellitus II Neg Hx    Hypertension Neg Hx    Social History   Tobacco Use   Smoking status: Never   Smokeless tobacco: Never  Substance Use Topics   Alcohol  use: No   LABS:  Hospital Outpatient Visit on 06/07/2024  Component Date Value Ref Range Status  WBC 06/07/2024 10.3  4.0 - 10.5 K/uL Final   RBC 06/07/2024 5.27  4.22 - 5.81 MIL/uL Final   Hemoglobin 06/07/2024 15.9  13.0 - 17.0 g/dL Final   HCT 93/81/7974 46.6  39.0 - 52.0 % Final   MCV 06/07/2024 88.4  80.0 - 100.0 fL Final   MCH 06/07/2024 30.2  26.0 - 34.0 pg Final   MCHC 06/07/2024 34.1  30.0 - 36.0 g/dL Final   RDW 93/81/7974 14.5  11.5 - 15.5 % Final   Platelets 06/07/2024 297  150 - 400 K/uL Final   nRBC 06/07/2024 0.0  0.0 - 0.2 % Final   Performed at [facility], [address] [place], [place] [address]   Sodium 06/07/2024 139  135 - 145 mmol/L Final   Potassium 06/07/2024 3.8  3.5 - 5.1 mmol/L Final   Chloride 06/07/2024 105  98 - 111 mmol/L Final   CO2  06/07/2024 25  22 - 32 mmol/L Final   Glucose, Bld 06/07/2024 132 (H)  70 - 99 mg/dL Final   Glucose reference range applies only to samples taken after fasting for at least 8 hours.   BUN 06/07/2024 23  8 - 23 mg/dL Final   Creatinine, Ser 06/07/2024 1.31 (H)  0.61 - 1.24 mg/dL Final   Calcium  06/07/2024 8.7 (L)  8.9 - 10.3 mg/dL Final   GFR, Estimated 06/07/2024 55 (L)  >60 mL/min Final   Comment: (NOTE) Calculated using the CKD-EPI Creatinine Equation (2021)    Anion gap 06/07/2024 9  5 - 15 Final   Performed at [facility], [address], [place], [place] [address]  Appointment on 05/26/2024  Component Date Value Ref Range Status   Specific Gravity, UA 05/26/2024 1.025  1.005 - 1.030 Final   pH, UA 05/26/2024 6.0  5.0 - 7.5 Final   Color, UA 05/26/2024 Yellow  Yellow Final   Appearance Ur 05/26/2024 Clear  Clear Final   Leukocytes,UA 05/26/2024 Negative  Negative Final   Protein,UA 05/26/2024 Trace  Negative/Trace Final   Glucose, UA 05/26/2024 Negative  Negative Final   Ketones, UA 05/26/2024 Negative  Negative Final   RBC, UA 05/26/2024 Negative  Negative Final   Bilirubin, UA 05/26/2024 Negative  Negative Final   Urobilinogen, Ur 05/26/2024 0.2  0.2 - 1.0 mg/dL Final   Nitrite, UA 93/93/7974 Negative  Negative Final   Microscopic Examination 05/26/2024 Comment   Final   Microscopic follows if indicated.   Microscopic Examination 05/26/2024 See below:   Final   Microscopic was indicated and was performed.   Urine Culture, Comprehensive 05/26/2024 Final report (A)   Final   Organism ID, Bacteria 05/26/2024 Comment (A)   Final   Comment: Alloscardovia omnicolens 10,000-25,000 colony forming units per mL Susceptibility not normally performed on this organism.    Organism ID, Bacteria 05/26/2024 Escherichia coli (A)   Final   Comment: Cefazolin  with an MIC <=16 predicts susceptibility to the oral agents cefaclor, cefdinir, cefpodoxime, cefprozil, cefuroxime ,  cephalexin, and loracarbef when used for therapy of uncomplicated urinary tract infections due to E. coli, Klebsiella pneumoniae, and Proteus mirabilis. 1,000 Colonies/mL    WBC, UA 05/26/2024 0-5  0 - 5 /hpf Final   RBC, Urine 05/26/2024 0-2  0 - 2 /hpf Final   Epithelial Cells (non renal) 05/26/2024 >10 (A)  0 - 10 /hpf Final   Mucus, UA 05/26/2024 Present (A)  Not Estab. Final   Bacteria, UA 05/26/2024 Few  None seen/Few Final  ECG: Date: 06/07/2024  Time ECG obtained: 1522 PM Rate: 61 bpm Rhythm: Sinus rhythm with first-degree AV block Axis (leads I and aVF): normal Intervals: PR 244 ms. QRS 84 ms. QTc 416 ms. ST segment and T wave changes: No evidence of acute T wave abnormalities or significant ST segment elevation or depression.  Evidence of a possible, age undetermined, prior infarct:  Yes; inferior Comparison: Similar to previous tracing obtained on 03/04/2023   IMAGING / PROCEDURES: CT HEAD WO CONTRAST performed on 06/04/2023 Chronic changes similar to that seen on the prior exam. Mild mucosal thickening in the right maxillary antrum.  CT ANGIO ABD/PEL W/ AND/OR W/O performed on 03/04/2023 No acute findings. No active extravasation. Descending and sigmoid diverticulosis. Left nephrolithiasis without hydronephrosis. Aortic atherosclerosis    TRANSTHORACIC ECHOCARDIOGRAM performed on 10/06/2017 Normal left ventricular systolic function with an EF of 55-60% Moderate asymmetric hypertrophy of the septum No regional wall motion abnormalities Diastolic Doppler parameters consistent with abnormal relaxation (G1DD) Aortic root mildly dilated at 41 mm Left atrium mildly dilated   LEFT HEART CATHETERIZATION AND CORONARY ANGIOGRAPHY performed on 10/06/2017 LVEF 55% Multivessel CAD 60% stenosis of the second diagonal 100% stenosis of the second RPLB 60% stenosis of the third RPLB 40% stenosis of the proximal LAD 40% stenosis of the mid LAD 60% stenosis of the first  marginal Recommendations: Moderate diffuse disease in multiple vessels.   Occluded PDA has collaterals.  Diffusely disease likely with thrombus.  Intervention of PDA with little perceived benefit Aggressive medical management      IMPRESSION AND PLAN: Gregory [name] has been referred for pre-anesthesia review and clearance prior to him undergoing the planned anesthetic and procedural courses. Available labs, pertinent testing, and imaging results were personally reviewed by me in preparation for upcoming operative/procedural course. [facility] medical record has been updated following extensive record review and patient interview with PAT staff.   This patient has been appropriately cleared by cardiology with an overall ACCEPTABLE risk of patient experiencing significant perioperative cardiovascular complications. Based on clinical review performed today (06/19/24), barring any significant acute changes in the patient's overall condition, it is anticipated that he will be able to proceed with the planned surgical intervention. Any acute changes in clinical condition may necessitate his procedure being postponed and/or cancelled. Patient will meet with anesthesia team (MD and/or CRNA) on the day of his procedure for preoperative evaluation/assessment. Questions regarding anesthetic course will be fielded at that time.   Pre-surgical instructions were reviewed with the patient during his PAT appointment, and questions were fielded to satisfaction by PAT clinical staff. He has been instructed on which medications that he will need to hold prior to surgery, as well as the ones that have been deemed safe/appropriate to take on the day of his procedure. As part of the general education provided by PAT, patient made aware both verbally and in writing, that he would need to abstain from the use of any illegal substances during his perioperative course. He was advised that failure to follow the provided  instructions could necessitate case cancellation or result in serious perioperative complications up to and including death. Patient encouraged to contact PAT and/or his surgeon's office to discuss any questions or concerns that may arise prior to surgery; verbalized understanding.   Dorise Pereyra, MSN, APRN, FNP-C, CEN [facility]  Perioperative Services Nurse Practitioner Phone: [phone] Fax: [phone] 06/19/24 11:20 AM  NOTE: This note has been prepared using Dragon dictation software. Despite my  best ability to proofread, there is always the potential that unintentional transcriptional errors may still occur from this process.

## 2024-06-20 ENCOUNTER — Ambulatory Visit

## 2024-06-20 ENCOUNTER — Encounter
Admission: RE | Disposition: A | Discharge status: Home / Self Care | Payer: Self-pay | Source: Home / Self Care | Attending: Urology

## 2024-06-20 ENCOUNTER — Encounter: Payer: Self-pay | Admitting: Urology

## 2024-06-20 ENCOUNTER — Ambulatory Visit
Admission: RE | Admit: 2024-06-20 | Discharge: 2024-06-20 | Disposition: A | Discharge status: Home / Self Care | Attending: Urology | Admitting: Urology

## 2024-06-20 ENCOUNTER — Other Ambulatory Visit: Payer: Self-pay

## 2024-06-20 ENCOUNTER — Ambulatory Visit: Payer: Self-pay | Admitting: Urgent Care

## 2024-06-20 DIAGNOSIS — I700 Atherosclerosis of aorta: Secondary | ICD-10-CM | POA: Insufficient documentation

## 2024-06-20 DIAGNOSIS — Z79899 Other long term (current) drug therapy: Secondary | ICD-10-CM | POA: Insufficient documentation

## 2024-06-20 DIAGNOSIS — Z7982 Long term (current) use of aspirin: Secondary | ICD-10-CM | POA: Insufficient documentation

## 2024-06-20 DIAGNOSIS — E785 Hyperlipidemia, unspecified: Secondary | ICD-10-CM | POA: Diagnosis not present

## 2024-06-20 DIAGNOSIS — G4733 Obstructive sleep apnea (adult) (pediatric): Secondary | ICD-10-CM | POA: Diagnosis not present

## 2024-06-20 DIAGNOSIS — D090 Carcinoma in situ of bladder: Secondary | ICD-10-CM | POA: Diagnosis not present

## 2024-06-20 DIAGNOSIS — I2510 Atherosclerotic heart disease of native coronary artery without angina pectoris: Secondary | ICD-10-CM | POA: Diagnosis not present

## 2024-06-20 DIAGNOSIS — Z86018 Personal history of other benign neoplasm: Secondary | ICD-10-CM | POA: Insufficient documentation

## 2024-06-20 DIAGNOSIS — Z8546 Personal history of malignant neoplasm of prostate: Secondary | ICD-10-CM | POA: Insufficient documentation

## 2024-06-20 DIAGNOSIS — I252 Old myocardial infarction: Secondary | ICD-10-CM | POA: Insufficient documentation

## 2024-06-20 DIAGNOSIS — I10 Essential (primary) hypertension: Secondary | ICD-10-CM | POA: Insufficient documentation

## 2024-06-20 DIAGNOSIS — D494 Neoplasm of unspecified behavior of bladder: Secondary | ICD-10-CM | POA: Diagnosis present

## 2024-06-20 DIAGNOSIS — N3289 Other specified disorders of bladder: Secondary | ICD-10-CM

## 2024-06-20 HISTORY — DX: Diverticulosis of intestine, part unspecified, without perforation or abscess without bleeding: K57.90

## 2024-06-20 HISTORY — PX: BLADDER INSTILLATION: SHX6893

## 2024-06-20 HISTORY — DX: Long term (current) use of aspirin: Z79.82

## 2024-06-20 HISTORY — DX: Neoplasm of unspecified behavior of bladder: D49.4

## 2024-06-20 HISTORY — PX: CYSTOSCOPY W/ RETROGRADES: SHX1426

## 2024-06-20 HISTORY — PX: TRANSURETHRAL RESECTION OF BLADDER TUMOR: SHX2575

## 2024-06-20 HISTORY — DX: Atherosclerosis of aorta: I70.0

## 2024-06-20 SURGERY — TURBT (TRANSURETHRAL RESECTION OF BLADDER TUMOR)
Anesthesia: General | Site: Bladder

## 2024-06-20 MED ORDER — ACETAMINOPHEN 10 MG/ML IV SOLN: 1000.0000 mg | Freq: Once | INTRAVENOUS | Status: DC | PRN

## 2024-06-20 MED ORDER — ROCURONIUM BROMIDE 100 MG/10ML IV SOLN
INTRAVENOUS | Status: DC | PRN
  Administered 2024-06-20: 50 mg via INTRAVENOUS

## 2024-06-20 MED ORDER — ACETAMINOPHEN 10 MG/ML IV SOLN
INTRAVENOUS | Status: AC
  Filled 2024-06-20: qty 100

## 2024-06-20 MED ORDER — DROPERIDOL 2.5 MG/ML IJ SOLN: 0.6250 mg | Freq: Once | INTRAMUSCULAR | Status: DC | PRN

## 2024-06-20 MED ORDER — CEFAZOLIN SODIUM-DEXTROSE 2-4 GM/100ML-% IV SOLN
INTRAVENOUS | Status: AC
Start: 2024-06-20 — End: 2024-06-20
  Filled 2024-06-20: qty 100

## 2024-06-20 MED ORDER — SUGAMMADEX SODIUM 200 MG/2ML IV SOLN
INTRAVENOUS | Status: DC | PRN
  Administered 2024-06-20: 200 mg via INTRAVENOUS

## 2024-06-20 MED ORDER — PROPOFOL 10 MG/ML IV BOLUS
INTRAVENOUS | Status: AC
Start: 2024-06-20 — End: 2024-06-20
  Filled 2024-06-20: qty 20

## 2024-06-20 MED ORDER — PROPOFOL 1000 MG/100ML IV EMUL
INTRAVENOUS | Status: AC
  Filled 2024-06-20: qty 100

## 2024-06-20 MED ORDER — IOHEXOL 180 MG/ML  SOLN
INTRAMUSCULAR | Status: DC | PRN
  Administered 2024-06-20 (×2): 10 mL

## 2024-06-20 MED ORDER — EPHEDRINE SULFATE-NACL 50-0.9 MG/10ML-% IV SOSY
PREFILLED_SYRINGE | INTRAVENOUS | Status: DC | PRN
  Administered 2024-06-20: 5 mg via INTRAVENOUS
  Administered 2024-06-20: 10 mg via INTRAVENOUS
  Administered 2024-06-20 (×2): 5 mg via INTRAVENOUS

## 2024-06-20 MED ORDER — CHLORHEXIDINE GLUCONATE 0.12 % MT SOLN
OROMUCOSAL | Status: AC
  Filled 2024-06-20: qty 15

## 2024-06-20 MED ORDER — FENTANYL CITRATE (PF) 100 MCG/2ML IJ SOLN
INTRAMUSCULAR | Status: AC
  Filled 2024-06-20: qty 2

## 2024-06-20 MED ORDER — OXYCODONE HCL 5 MG/5ML PO SOLN: 5.0000 mg | Freq: Once | ORAL | Status: DC | PRN

## 2024-06-20 MED ORDER — LIDOCAINE HCL (CARDIAC) PF 100 MG/5ML IV SOSY
PREFILLED_SYRINGE | INTRAVENOUS | Status: DC | PRN
  Administered 2024-06-20: 100 mg via INTRAVENOUS

## 2024-06-20 MED ORDER — FENTANYL CITRATE (PF) 100 MCG/2ML IJ SOLN
INTRAMUSCULAR | Status: DC | PRN
  Administered 2024-06-20: 50 ug via INTRAVENOUS

## 2024-06-20 MED ORDER — DEXAMETHASONE SODIUM PHOSPHATE 10 MG/ML IJ SOLN
INTRAMUSCULAR | Status: DC | PRN
  Administered 2024-06-20: 5 mg via INTRAVENOUS

## 2024-06-20 MED ORDER — SODIUM CHLORIDE 0.9 % IR SOLN
Status: DC | PRN
Start: 2024-06-20 — End: 2024-06-20
  Administered 2024-06-20: 3000 mL
  Administered 2024-06-20: 6000 mL

## 2024-06-20 MED ORDER — OXYCODONE HCL 5 MG PO TABS: 5.0000 mg | ORAL_TABLET | Freq: Once | ORAL | Status: DC | PRN

## 2024-06-20 MED ORDER — FENTANYL CITRATE (PF) 100 MCG/2ML IJ SOLN: 25.0000 ug | INTRAMUSCULAR | Status: DC | PRN

## 2024-06-20 MED ORDER — GEMCITABINE CHEMO FOR BLADDER INSTILLATION 2000 MG
INTRAVENOUS | Status: DC | PRN
  Administered 2024-06-20: 2000 mg via INTRAVESICAL

## 2024-06-20 MED ORDER — ACETAMINOPHEN 10 MG/ML IV SOLN
INTRAVENOUS | Status: DC | PRN
  Administered 2024-06-20: 1000 mg via INTRAVENOUS

## 2024-06-20 MED ORDER — PROPOFOL 10 MG/ML IV BOLUS
INTRAVENOUS | Status: DC | PRN
  Administered 2024-06-20: 150 mg via INTRAVENOUS

## 2024-06-20 MED ORDER — ONDANSETRON HCL 4 MG/2ML IJ SOLN
INTRAMUSCULAR | Status: DC | PRN
  Administered 2024-06-20: 4 mg via INTRAVENOUS

## 2024-06-20 SURGICAL SUPPLY — 23 items
BAG DRAIN SIEMENS DORNER NS (MISCELLANEOUS) ×3 IMPLANT
BAG URINE DRAIN 2000ML AR STRL (UROLOGICAL SUPPLIES) ×3 IMPLANT
BRUSH SCRUB EZ 4% CHG (MISCELLANEOUS) ×3 IMPLANT
CATH FOLEY 2WAY 18X30 (CATHETERS) IMPLANT
CATH URETL OPEN END 6X70 (CATHETERS) ×3 IMPLANT
DRAPE UTILITY 15X26 TOWEL STRL (DRAPES) ×3 IMPLANT
DRSG TELFA 3X4 N-ADH STERILE (GAUZE/BANDAGES/DRESSINGS) ×3 IMPLANT
ELECTRODE LOOP 22F BIPOLAR SML (ELECTROSURGICAL) IMPLANT
ELECTRODE REM PT RTRN 9FT ADLT (ELECTROSURGICAL) IMPLANT
GLOVE BIOGEL PI IND STRL 7.5 (GLOVE) ×3 IMPLANT
GOWN STRL REUS W/ TWL LRG LVL3 (GOWN DISPOSABLE) ×3 IMPLANT
GOWN STRL REUS W/ TWL XL LVL3 (GOWN DISPOSABLE) ×3 IMPLANT
GOWN STRL REUS W/TWL XL LVL4 (GOWN DISPOSABLE) ×3 IMPLANT
GUIDEWIRE STR DUAL SENSOR (WIRE) ×3 IMPLANT
KIT TURNOVER CYSTO (KITS) ×3 IMPLANT
LOOP CUT BIPOLAR 24F LRG (ELECTROSURGICAL) IMPLANT
PACK CYSTO AR (MISCELLANEOUS) ×3 IMPLANT
SET CYSTO W/LG BORE CLAMP LF (SET/KITS/TRAYS/PACK) ×3 IMPLANT
SET IRRIG Y TYPE TUR BLADDER L (SET/KITS/TRAYS/PACK) ×3 IMPLANT
SOL .9 NS 3000ML IRR UROMATIC (IV SOLUTION) ×6 IMPLANT
SURGILUBE 2OZ TUBE FLIPTOP (MISCELLANEOUS) ×3 IMPLANT
SYRINGE TOOMEY IRRIG 70ML (MISCELLANEOUS) ×3 IMPLANT
WATER STERILE IRR 500ML POUR (IV SOLUTION) ×3 IMPLANT

## 2024-06-20 NOTE — Anesthesia Procedure Notes (Signed)
 Procedure Name: Intubation Date/Time: 06/20/2024 7:39 AM  Performed by: Claudene Cornet, CRNAPre-anesthesia Checklist: Patient identified, Emergency Drugs available, Suction available and Patient being monitored Patient Re-evaluated:Patient Re-evaluated prior to induction Oxygen Delivery Method: Circle system utilized Preoxygenation: Pre-oxygenation with 100% oxygen Induction Type: IV induction Ventilation: Mask ventilation without difficulty and Oral airway inserted - appropriate to patient size Laryngoscope Size: McGrath and 4 Grade View: Grade II Tube type: Oral Tube size: 7.0 mm Number of attempts: 1 Airway Equipment and Method: Stylet, Oral airway and Video-laryngoscopy Placement Confirmation: ETT inserted through vocal cords under direct vision, positive ETCO2 and breath sounds checked- equal and bilateral Secured at: 23 cm Tube secured with: Tape Dental Injury: Teeth and Oropharynx as per pre-operative assessment

## 2024-06-20 NOTE — H&P (Signed)
 Urology H&P    Assessment/Recommendations: 83 y.o. male with multifocal papillary bladder tumors for TURBT and instillation of intravesical gemcitabine. Bilateral retrograde pyelography will also be performed Preop urine culture grew alloscardiovia omnicolens consistent with colonization however sensitivities not performed.  He saw ID for recommendations of preoperative antibiotics  History of Present Illness: Gregory [name] is a 83 y.o. male followed by Dr. Penne with a history of PUNLMP with recent episode of gross hematuria.  Cystoscopy remarkable for papillary tumor at bladder neck, left lateral wall and possibly right lateral wall.  He presents for TURBT with instillation of intravesical gemcitabine.  Past Medical History:  Diagnosis Date   Actinic keratosis    Aortic atherosclerosis (HCC)    Aortic root dilatation (HCC) 10/06/2017   a.) TTE 10/06/2017: Ao root 41mm.   Arthritis    Basal cell carcinoma 08/21/2009   Left neck. Excised: 10/03/2009, margins free.   Bladder tumor    Coronary artery disease 10/06/2017   a.) LHC 10/06/2017: 60% D2, 100% 2nd RPLB, 60% 3rd RPLB, 40% pLAD, 40% mLAD, 60% OM1 - med mgmt   Diastolic dysfunction 10/06/2017   Diverticulosis    Dysplastic nevus 08/23/2008   Right vertex. Moderate to severe atypia, bordering on early evolving MMis.   Dysplastic nevus 02/21/2010   Right vertex. Moderate to severe atypia, bordering on early evolving MMis.   Dysplastic nevus 03/14/2023   Right mid back. Mild atypia, limited margins free.   GERD (gastroesophageal reflux disease)    GI bleeding 02/2023   History of acoustic neuroma 02/2004   a.) s/p crainotomy for removal --> complicated by CSF leak requiring 2nd crainotomy for internal auditory canal closure, closure or dura, and placement of lumbar drain on 06/13/2004   History of craniotomy    History of kidney stones    HLD (hyperlipidemia)    Hypertension    Left-sided weakness    Residual  from brain tumor- removed 2005   Long-term use of aspirin  therapy    Melanoma in situ (HCC) 2019   right vertex scalp   NSTEMI (non-ST elevated myocardial infarction) (HCC) 10/06/2017   a.) LHC 10/06/2017: 60% D2, 100% 2nd RPLB, 60% 3rd RPLB, 40% pLAD, 40% mLAD, 60% OM1; intervention deferred opting for medical management   OSA (obstructive sleep apnea)    a.) does not require nocturnal PAP therapy   Prostate cancer (s/p resection)    Rheumatic fever    Squamous cell carcinoma of skin 07/27/2018   Right posterior ear. WD SCC. [facility]    Past Surgical History:  Procedure Laterality Date   BLADDER SURGERY  12/21/2010   Dr. Patrina, [facility]   CATARACT EXTRACTION W/PHACO Left 04/05/2024   Procedure: CATARACT EXTRACTION PHACO AND INTRAOCULAR LENS PLACEMENT (IOC) LEFT MALYUGIN 10.62 00:43.5;  Surgeon: Mittie Gaskin, MD;  Location: [facility];  Service: Ophthalmology;  Laterality: Left;   CHOLECYSTECTOMY     COLONOSCOPY WITH PROPOFOL  N/A 03/05/2023   Procedure: COLONOSCOPY WITH PROPOFOL ;  Surgeon: Maryruth Ole DASEN, MD;  Location: [facility];  Service: Endoscopy;  Laterality: N/A;   CRANIOTOMY Left 02/2004   Procedure: CRANIOTOMY FOR ACOUSTIC NEUROMA REMOVAL; Location: [facility]   CRANIOTOMY FOR REPAIR DURAL / CSF LEAK N/A 06/13/2004   Procedure: SECONDARY SUBOCCIPITAL CRANIOTOMY WITH CLOSURE OF INTERNAL AUDITORY CANAL, CLOSURE OF DURA, AND PLACEMENT OF LUMBAR DRAIN; Location: [facility]   CYSTOSCOPY WITH RETROGRADE URETHROGRAM  03/16/2016   Procedure: CYSTOSCOPY WITH RETROGRADE URETHROGRAM;  Surgeon: Rosina Penne, MD;  Location: [facility];  Service:  Urology;;   CYSTOSCOPY WITH STENT PLACEMENT Left 02/23/2016   Procedure: CYSTOSCOPY WITH STENT PLACEMENT;  Surgeon: Zina JINNY Irwin, MD;  Location: [facility];  Service: Urology;  Laterality: Left;   CYSTOSCOPY WITH STENT PLACEMENT Left 03/16/2016   Procedure: CYSTOSCOPY WITH STENT PLACEMENT/EXCHANGE;  Surgeon: Rosina Riis, MD;   Location: [facility];  Service: Urology;  Laterality: Left;   LEFT HEART CATH AND CORONARY ANGIOGRAPHY N/A 10/06/2017   Procedure: LEFT HEART CATH AND CORONARY ANGIOGRAPHY;  Surgeon: Perla Evalene JINNY, MD;  Location: [facility];  Service: Cardiovascular;  Laterality: N/A;   MELANOMA EXCISION     PROSTATECTOMY  12/21/2001   Dr. Patrina, [facility]   SHOULDER ARTHROSCOPY WITH SUBACROMIAL DECOMPRESSION, ROTATOR CUFF REPAIR AND BICEP TENDON REPAIR Left 02/05/2022   Procedure: SHOULDER ARTHROSCOPY WITH EXTENSIVE DEBRIDEMENT, DECOMPRESSION,;  Surgeon: Edie Norleen JINNY, MD;  Location: [facility];  Service: Orthopedics;  Laterality: Left;   STONE EXTRACTION WITH BASKET  03/16/2016   Procedure: STONE EXTRACTION WITH BASKET;  Surgeon: Rosina Riis, MD;  Location: [facility];  Service: Urology;;    Home Medications:  Current Meds  Medication Sig   amLODipine  (NORVASC ) 10 MG tablet TAKE 1 TABLET EVERY MORNING   aspirin  EC 81 MG tablet Take 1 tablet (81 mg total) by mouth daily. Swallow whole.   atenolol  (TENORMIN ) 25 MG tablet Take by mouth daily.   atorvastatin  (LIPITOR) 40 MG tablet TAKE 1 TABLET EVERY DAY   latanoprost (XALATAN) 0.005 % ophthalmic solution Place 1 drop into both eyes at bedtime.   nitroGLYCERIN  (NITROSTAT ) 0.4 MG SL tablet Place 1 tablet (0.4 mg total) under the tongue every 5 (five) minutes as needed for chest pain (hold for blood pressure less than 90).   solifenacin  (VESICARE ) 5 MG tablet Take 1 tablet (5 mg total) by mouth daily.    Allergies:  Allergies  Allergen Reactions   Iodine Anaphylaxis   Shellfish Allergy Anaphylaxis    Family History  Problem Relation Age of Onset   CAD Father    Polycystic kidney disease Father    Diabetes Mellitus II Neg Hx    Hypertension Neg Hx     Social History:  reports that he has never smoked. He has never used smokeless tobacco. He reports that he does not drink alcohol  and does not use drugs.  ROS: Noncontributory  Physical  Exam:  Vital signs in last 24 hours: Temp:  [97.6 F (36.4 C)] 97.6 F (36.4 C) (07/01 0631) Pulse Rate:  [55] 55 (07/01 0631) Resp:  [17] 17 (07/01 0631) BP: (146)/(72) 146/72 (07/01 0631) SpO2:  [97 %] 97 % (07/01 0631) Weight:  [91.2 kg] 91.2 kg (07/01 0631) Constitutional:  Alert and oriented, No acute distress HEENT: [place] AT, moist mucus membranes.  Trachea midline, no masses Cardiovascular: Regular rate and rhythm Respiratory: Normal respiratory effort, lungs clear bilaterally Psychiatric: Normal mood and affect   Laboratory Data:  No results for input(s): WBC, HGB, HCT in the last 72 hours. No results for input(s): NA, K, CL, CO2, GLUCOSE, BUN, CREATININE, CALCIUM  in the last 72 hours. No results for input(s): LABPT, INR in the last 72 hours. No results for input(s): LABURIN in the last 72 hours. Results for orders placed or performed in visit on 05/26/24  CULTURE, URINE COMPREHENSIVE     Status: Abnormal   Collection Time: 05/26/24  1:56 PM   Specimen: Urine   UR  Result Value Ref Range Status   Urine Culture, Comprehensive Final report (A)  Final   Organism ID, Bacteria Comment (A)  Final    Comment: Alloscardovia omnicolens 10,000-25,000 colony forming units per mL Susceptibility not normally performed on this organism.    Organism ID, Bacteria Escherichia coli (A)  Final    Comment: Cefazolin  with an MIC <=16 predicts susceptibility to the oral agents cefaclor, cefdinir, cefpodoxime, cefprozil, cefuroxime , cephalexin, and loracarbef when used for therapy of uncomplicated urinary tract infections due to E. coli, Klebsiella pneumoniae, and Proteus mirabilis. 1,000 Colonies/mL   Microscopic Examination     Status: Abnormal   Collection Time: 05/26/24  1:56 PM   Urine  Result Value Ref Range Status   WBC, UA 0-5 0 - 5 /hpf Final   RBC, Urine 0-2 0 - 2 /hpf Final   Epithelial Cells (non renal) >10 (A) 0 - 10 /hpf Final   Mucus, UA  Present (A) Not Estab. Final   Bacteria, UA Few None seen/Few Final      06/20/2024, 7:18 AM  Glendia Barba,  MD

## 2024-06-20 NOTE — Op Note (Signed)
   Preoperative diagnosis:  Multifocal bladder tumors  Postoperative diagnosis:  Same  Procedure: Transurethral resection bladder tumor (< 2 cm) Retrograde pyelogram with interpretation-bilateral Instillation intravesical gemcitabine  Surgeon: Glendia JAYSON Barba, MD  Anesthesia: General  Complications: None  Intraoperative findings:  1 cm tumor bladder neck < 5 mm tumor posterolateral to right UO Early papillary change mucosa left lateral wall Left retrograde pyelogram: Ureter normal in caliber without dilation, filling defects.  Collecting system normal in appearance without filling defects or hydronephrosis Right retrograde pyelogram: Ureter normal in caliber without dilation, filling defects.  Collecting system normal in appearance without filling defects or hydronephrosis.  EBL: Minimal  Specimens:  Tumor bladder neck Tumor right lateral wall Biopsy early papillary change left lateral wall  Indication: Gregory [name] is a 83 y.o. male with a history PUNLMP.  Recently seen by Dr. Penne after an episode of gross hematuria.  RUS showed a small left renal mass.  Cystoscopy with papillary tumors at bladder neck, right lateral wall and area of early papillary change left lateral wall.  After reviewing the management options for treatment, he elected to proceed with the above surgical procedure(s). We have discussed the potential benefits and risks of the procedure, side effects of the proposed treatment, the likelihood of the patient achieving the goals of the procedure, and any potential problems that might occur during the procedure or recuperation. Informed consent has been obtained.  Description of procedure:  The patient was taken to the operating room and general anesthesia was induced.  The patient was placed in the dorsal lithotomy position, prepped and draped in the usual sterile fashion, and preoperative antibiotics were administered. A preoperative time-out was  performed.   A 26 French continuous-flow resectoscope sheath with obturator was lubricated and passed per urethra.  The obturator was removed and replaced with a laser bridge.  Panendoscopy was then performed with findings as described above.  A 82F open-ended ureteral catheter was placed through the laser bridge and into the left UO.  Retrograde pyelogram was then performed with Omnipaque  contrast with findings as described above.  An identical procedure was then performed on the contralateral side with findings as described above.  The laser bridge was replaced with an Faustine resectoscope with a loop.  Using bipolar current the bladder tumor was then resected down to superficial muscle.  The specimen was removed and sent for pathology.  The resection site and surrounding mucosa was then fulgurated.  The right lateral wall tumor was resected in a similar fashion and hemostasis obtained in a similar fashion.  The area of early papillary change was biopsied with cold forceps.  The biopsy site and surrounding mucosa was then fulgurated.  2000 mg of intravesical gemcitabine was instilled to the bladder.  This was allowed to dwell in the PACU for 1 hour. This was well-tolerated.  After 1 hour, the catheter was drained and removed.  Plan: He will be contacted with the pathology results and further follow-up recommendations will be scheduled at that time   Glendia JAYSON Barba, M.D.

## 2024-06-20 NOTE — Discharge Instructions (Signed)
 Transurethral Resection of Bladder Tumor (TURBT) or Bladder Biopsy   Definition:  Transurethral Resection of the Bladder Tumor is a surgical procedure used to diagnose and remove tumors within the bladder.   General instructions:     Your recent bladder surgery requires very little post hospital care but some definite precautions.  Despite the fact that no skin incisions were used, the area around the bladder incisions are raw and covered with scabs to promote healing and prevent bleeding. Certain precautions are needed to insure that the scabs are not disturbed over the next 2-4 weeks while the healing proceeds.  Because the raw surface inside your bladder and the irritating effects of urine you may expect frequency of urination and/or urgency (a stronger desire to urinate) and perhaps even getting up at night more often. This will usually resolve or improve slowly over the healing period. You may see some blood in your urine over the first 6 weeks. Do not be alarmed, even if the urine was clear for a while. Get off your feet and drink lots of fluids until clearing occurs. If you start to pass clots or don't improve call us .  Diet:  You may return to your normal diet immediately. Because of the raw surface of your bladder, alcohol , spicy foods, foods high in acid and drinks with caffeine may cause irritation or frequency and should be used in moderation. To keep your urine flowing freely and avoid constipation, drink plenty of fluids during the day (8-10 glasses). Tip: Avoid cranberry juice because it is very acidic.  Activity:  Your physical activity doesn't need to be restricted. However, if you are very active, you may see some blood in the urine. We suggest that you reduce your activity under the circumstances until the bleeding has stopped.  Bowels:  It is important to keep your bowels regular during the postoperative period. Straining with bowel movements can cause bleeding. A bowel  movement every other day is reasonable. Use a mild laxative if needed, such as milk of magnesia 2-3 tablespoons, or 2 Dulcolax tablets. Call if you continue to have problems. If you had been taking narcotics for pain, before, during or after your surgery, you may be constipated. Take a laxative if necessary.    Medication:  You should resume your pre-surgery medications unless told not to.  Over-the-counter AZO for burning can be used if significant burning with urination.    Follow-up: You will be contacted with your biopsy results and further follow-up will be scheduled at that time   [facility] [address] [place], [place] [address] [phone]

## 2024-06-20 NOTE — Interval H&P Note (Signed)
 History and Physical Interval Note:  06/20/2024 7:25 AM  Gregory [name]  has presented today for surgery, with the diagnosis of Bladder Tumor.  The various methods of treatment have been discussed with the patient and family. After consideration of risks, benefits and other options for treatment, the patient has consented to  Procedure(s) with comments: TURBT (TRANSURETHRAL RESECTION OF BLADDER TUMOR) (N/A) CYSTOSCOPY, WITH RETROGRADE PYELOGRAM (Bilateral) INSTILLATION, BLADDER (N/A) - GEMCITABINE as a surgical intervention.  The patient's history has been reviewed, patient examined, no change in status, stable for surgery.  I have reviewed the patient's chart and labs.  Questions were answered to the patient's satisfaction.     Aline Wesche C Lareina Espino

## 2024-06-20 NOTE — Transfer of Care (Signed)
 Immediate Anesthesia Transfer of Care Note  Patient: Gregory [name]  Procedure(s) Performed: TURBT (TRANSURETHRAL RESECTION OF BLADDER TUMOR) (Bladder) CYSTOSCOPY, WITH RETROGRADE PYELOGRAM (Bilateral: Bladder) INSTILLATION, BLADDER  Patient Location: PACU  Anesthesia Type:General  Level of Consciousness: awake, drowsy, and patient cooperative  Airway & Oxygen Therapy: Patient Spontanous Breathing and Patient connected to face mask oxygen  Post-op Assessment: Report given to RN and Post -op Vital signs reviewed and stable  Post vital signs: Reviewed and stable  Last Vitals:  Vitals Value Taken Time  BP 147/73 06/20/24 08:24  Temp 36.8 C 06/20/24 08:24  Pulse 72 06/20/24 08:27  Resp 13 06/20/24 08:27  SpO2 96 % 06/20/24 08:27  Vitals shown include unfiled device data.  Last Pain:  Vitals:   06/20/24 0631  TempSrc: Temporal         Complications: No notable events documented.

## 2024-06-20 NOTE — Anesthesia Postprocedure Evaluation (Signed)
 Anesthesia Post Note  Patient: Kyree Adriano Boomhower  Procedure(s) Performed: TURBT (TRANSURETHRAL RESECTION OF BLADDER TUMOR) (Bladder) CYSTOSCOPY, WITH RETROGRADE PYELOGRAM (Bilateral: Bladder) INSTILLATION, BLADDER  Patient location during evaluation: PACU Anesthesia Type: General Level of consciousness: awake and alert Pain management: pain level controlled Vital Signs Assessment: post-procedure vital signs reviewed and stable Respiratory status: spontaneous breathing, nonlabored ventilation and respiratory function stable Cardiovascular status: blood pressure returned to baseline and stable Postop Assessment: no apparent nausea or vomiting Anesthetic complications: no   No notable events documented.   Last Vitals:  Vitals:   06/20/24 0915 06/20/24 0931  BP: 128/78 124/65  Pulse: 60 (!) 56  Resp: 14 15  Temp:  36.8 C  SpO2: 90% 93%    Last Pain:  Vitals:   06/20/24 0931  TempSrc: Temporal  PainSc: 0-No pain                 Camellia Merilee Louder

## 2024-06-20 NOTE — Anesthesia Preprocedure Evaluation (Addendum)
 Anesthesia Evaluation  Patient identified by MRN, date of birth, ID band Patient awake    Reviewed: Allergy & Precautions, H&P , NPO status , Patient's Chart, lab work & pertinent test results  Airway Mallampati: III  TM Distance: >3 FB Neck ROM: Full    Dental  (+) Poor Dentition, Chipped Chipped, Poor Dentition, Caps, Dental Advisory Given:   Pulmonary neg pulmonary ROS, sleep apnea    Pulmonary exam normal breath sounds clear to auscultation       Cardiovascular hypertension, + CAD and + Past MI  Normal cardiovascular exam Rhythm:Regular Rate:Normal  10-06-17 Left ventricle: The cavity size was normal. There was moderate    asymmetric hypertrophy of the septum. Systolic function was    normal. The estimated ejection fraction was in the range of 55%    to 60%. Wall motion was normal; there were no regional wall    motion abnormalities. Doppler parameters are consistent with    abnormal left ventricular relaxation (grade 1 diastolic    dysfunction).  - Aorta: Aortic root dimension: 41 mm (ED).  - Aortic root: The aortic root was mildly dilated.  - Left atrium: The atrium was mildly dilated.   10-06-17 cath  2nd Diag lesion, 60 %stenosed.  2nd RPLB lesion, 100 %stenosed.  3rd RPLB lesion, 60 %stenosed.  Prox LAD lesion, 40 %stenosed.  Mid LAD lesion, 40 %stenosed.  1st Mrg lesion, 60 %stenosed.   Medical management recommended   Neuro/Psych  Neuromuscular disease negative neurological ROS  negative psych ROS   GI/Hepatic Neg liver ROS,GERD  ,,  Endo/Other  negative endocrine ROS    Renal/GU   negative genitourinary   Musculoskeletal negative musculoskeletal ROS (+) Arthritis ,    Abdominal  (+) + obese  Peds negative pediatric ROS (+)  Hematology negative hematology ROS (+)   Anesthesia Other Findings Hypertension  Overactive bladder Prostate cancer (HCC) Loss of equilibrium  Actinic  keratosis Basal cell carcinoma  Dysplastic nevus Dysplastic nevus  Squamous cell carcinoma of skin OSA (obstructive sleep apnea)  History of kidney stones GERD (gastroesophageal reflux disease) Arthritis NSTEMI (non-ST elevated myocardial infarction)  10-06-17 cardiology recommended medical management 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 Coronary artery disease  Diastolic dysfunction Aortic root dilatation (HCC)  Rheumatic fever HLD (hyperlipidemia)  Melanoma in situ (HCC) Dysplastic nevus  Left-sided weakness after craniotomy History of craniotomy In 2005, had crani for acoustic neuroma, then had second crani for CSF leak Grade I diastolic dysfunction    Reproductive/Obstetrics negative OB ROS                             Anesthesia Physical Anesthesia Plan  ASA: 3  Anesthesia Plan: General   Post-op Pain Management: Ofirmev  IV (intra-op)*   Induction: Intravenous  PONV Risk Score and Plan: Dexamethasone  and Ondansetron   Airway Management Planned: Oral ETT and LMA  Additional Equipment:   Intra-op Plan:   Post-operative Plan: Extubation in OR  Informed Consent: I have reviewed the patients History and Physical, chart, labs and discussed the procedure including the risks, benefits and alternatives for the proposed anesthesia with the patient or authorized representative who has indicated his/her understanding and acceptance.     Dental Advisory Given  Plan Discussed with: Anesthesiologist, CRNA and Surgeon  Anesthesia Plan Comments:         Anesthesia Quick Evaluation

## 2024-06-20 NOTE — Progress Notes (Signed)
 Per Dr. Twylla patient is to restart aspirin  tomorrow, notified patient and spouse, and added to AVS paperwork.

## 2024-06-21 ENCOUNTER — Other Ambulatory Visit: Admitting: Urology

## 2024-06-21 ENCOUNTER — Encounter: Payer: Self-pay | Admitting: Urology

## 2024-06-21 LAB — SURGICAL PATHOLOGY

## 2024-06-24 ENCOUNTER — Ambulatory Visit: Payer: Self-pay | Admitting: Urology

## 2024-06-28 ENCOUNTER — Ambulatory Visit: Admitting: Urology

## 2024-07-28 NOTE — Progress Notes (Signed)
 Cardiology Office Note  Date:  07/31/2024   ID:  Gregory [name], DOB 1941/08/24, MRN [id number]  PCP:  Sadie Manna, MD   Chief Complaint  Patient presents with   Follow-up    Patient present today for follow up Coronary artery disease.  Patient complains of edema in both legs.    HPI:  83 year old gentleman with  CAD on cardiac catheterization October 2018 Occluded PDA, collaterals, medical management recommended morbid obesity,  prostate cancer status post resection,  essential hypertension  who denies any prior cardiac history,  2005, acoutic neuroma History of GI bleed March 2024 He presents for follow-up of his coronary artery disease  Last seen by myself in clinic 7/24 In follow-up today reports doing well Denies significant chest pain or shortness of breath concerning for angina Trace edema Blood pressure well-controlled at home Active  No recent episodes of GI bleeding  Off olmesartan  in the past for low blood pressure  Labs reviewed A1C 5.8 Total chol 118, LDL 58  EKG personally reviewed by myself on todays visit EKG Interpretation Date/Time:  Monday July 31 2024 16:09:16 EDT Ventricular Rate:  61 PR Interval:  238 QRS Duration:  86 QT Interval:  428 QTC Calculation: 430 R Axis:   -13  Text Interpretation: Sinus rhythm with 1st degree A-V block Minimal voltage criteria for LVH, may be normal variant ( R in aVL ) Inferior infarct (cited on or before 07-Jun-2024) When compared with ECG of 07-Jun-2024 15:22, No significant change was found Confirmed by Perla Lye [phone]) on 07/31/2024 4:58:18 PM   In the hospital March 2024 with GI bleed Blood bright blood per rectum noted, at that time on baby aspirin  Felt to be secondary to diverticuli, colonoscopy with no active bleeding hemoglobin 15.5 down to 12.2 Aspirin  held by discharge team HCTZ and olmesartan  also held  Was cleared to restart aspirin  by GI April 2024  Prior brain surgery 2005,  acoustic neuroma , subsequent hearing issue,  Medical history reviewed Cath  10/06/2017 showed Occluded PDA at the ostium, small vessel Moderate disease of OM1 proximal region, Moderate to severe disease of proximal PL branch,  Moderate disease of ostial/proximal diagonal branch #2,  Mild diffuse disease of LAD. Medical management was recommended of his residual disease, no intervention performed Occluded PDA has collaterals, diffusely diseased likely with thrombus.  He was started on aspirin  Plavix , statin Already on beta-blocker  Echocardiogram normal LV function  PMH:   has a past medical history of Actinic keratosis, Aortic atherosclerosis (HCC), Aortic root dilatation (HCC) (10/06/2017), Arthritis, Basal cell carcinoma (08/21/2009), Bladder tumor, Coronary artery disease (10/06/2017), Diastolic dysfunction (10/06/2017), Diverticulosis, Dysplastic nevus (08/23/2008), Dysplastic nevus (02/21/2010), Dysplastic nevus (03/14/2023), GERD (gastroesophageal reflux disease), GI bleeding (02/2023), History of acoustic neuroma (02/2004), History of craniotomy, History of kidney stones, HLD (hyperlipidemia), Hypertension, Left-sided weakness, Long-term use of aspirin  therapy, Melanoma in situ (HCC) (2019), NSTEMI (non-ST elevated myocardial infarction) (HCC) (10/06/2017), OSA (obstructive sleep apnea), Prostate cancer (s/p resection), Rheumatic fever, and Squamous cell carcinoma of skin (07/27/2018).  PSH:    Past Surgical History:  Procedure Laterality Date   BLADDER INSTILLATION N/A 06/20/2024   Procedure: INSTILLATION, BLADDER;  Surgeon: Twylla Glendia BROCKS, MD;  Location: [facility];  Service: Urology;  Laterality: N/A;  GEMCITABINE    BLADDER SURGERY  12/21/2010   Dr. Patrina, [facility]   CATARACT EXTRACTION W/PHACO Left 04/05/2024   Procedure: CATARACT EXTRACTION PHACO AND INTRAOCULAR LENS PLACEMENT (IOC) LEFT MALYUGIN 10.62 00:43.5;  Surgeon: Mittie Gaskin, MD;  Location:  [facility];   Service: Ophthalmology;  Laterality: Left;   CHOLECYSTECTOMY     COLONOSCOPY WITH PROPOFOL  N/A 03/05/2023   Procedure: COLONOSCOPY WITH PROPOFOL ;  Surgeon: Maryruth Ole DASEN, MD;  Location: [facility];  Service: Endoscopy;  Laterality: N/A;   CRANIOTOMY Left 02/2004   Procedure: CRANIOTOMY FOR ACOUSTIC NEUROMA REMOVAL; Location: [facility]   CRANIOTOMY FOR REPAIR DURAL / CSF LEAK N/A 06/13/2004   Procedure: SECONDARY SUBOCCIPITAL CRANIOTOMY WITH CLOSURE OF INTERNAL AUDITORY CANAL, CLOSURE OF DURA, AND PLACEMENT OF LUMBAR DRAIN; Location: [facility]   CYSTOSCOPY W/ RETROGRADES Bilateral 06/20/2024   Procedure: CYSTOSCOPY, WITH RETROGRADE PYELOGRAM;  Surgeon: Twylla Glendia BROCKS, MD;  Location: [facility];  Service: Urology;  Laterality: Bilateral;   CYSTOSCOPY WITH RETROGRADE URETHROGRAM  03/16/2016   Procedure: CYSTOSCOPY WITH RETROGRADE URETHROGRAM;  Surgeon: Rosina Riis, MD;  Location: [facility];  Service: Urology;;   CYSTOSCOPY WITH STENT PLACEMENT Left 02/23/2016   Procedure: CYSTOSCOPY WITH STENT PLACEMENT;  Surgeon: Zina JINNY Irwin, MD;  Location: [facility];  Service: Urology;  Laterality: Left;   CYSTOSCOPY WITH STENT PLACEMENT Left 03/16/2016   Procedure: CYSTOSCOPY WITH STENT PLACEMENT/EXCHANGE;  Surgeon: Rosina Riis, MD;  Location: [facility];  Service: Urology;  Laterality: Left;   LEFT HEART CATH AND CORONARY ANGIOGRAPHY N/A 10/06/2017   Procedure: LEFT HEART CATH AND CORONARY ANGIOGRAPHY;  Surgeon: Perla Evalene JINNY, MD;  Location: [facility];  Service: Cardiovascular;  Laterality: N/A;   MELANOMA EXCISION     PROSTATECTOMY  12/21/2001   Dr. Patrina, [facility]   SHOULDER ARTHROSCOPY WITH SUBACROMIAL DECOMPRESSION, ROTATOR CUFF REPAIR AND BICEP TENDON REPAIR Left 02/05/2022   Procedure: SHOULDER ARTHROSCOPY WITH EXTENSIVE DEBRIDEMENT, DECOMPRESSION,;  Surgeon: Edie Norleen JINNY, MD;  Location: [facility];  Service: Orthopedics;  Laterality: Left;   STONE EXTRACTION WITH BASKET   03/16/2016   Procedure: STONE EXTRACTION WITH BASKET;  Surgeon: Rosina Riis, MD;  Location: [facility];  Service: Urology;;   TRANSURETHRAL RESECTION OF BLADDER TUMOR N/A 06/20/2024   Procedure: TURBT (TRANSURETHRAL RESECTION OF BLADDER TUMOR);  Surgeon: Twylla Glendia BROCKS, MD;  Location: [facility];  Service: Urology;  Laterality: N/A;    Current Outpatient Medications  Medication Sig Dispense Refill   amLODipine  (NORVASC ) 10 MG tablet TAKE 1 TABLET EVERY MORNING 90 tablet 3   aspirin  EC 81 MG tablet Take 1 tablet (81 mg total) by mouth daily. Swallow whole. 90 tablet 3   atenolol  (TENORMIN ) 25 MG tablet Take by mouth daily.     atorvastatin  (LIPITOR) 40 MG tablet TAKE 1 TABLET EVERY DAY 90 tablet 3   latanoprost (XALATAN) 0.005 % ophthalmic solution Place 1 drop into both eyes at bedtime.     nitroGLYCERIN  (NITROSTAT ) 0.4 MG SL tablet Place 1 tablet (0.4 mg total) under the tongue every 5 (five) minutes as needed for chest pain (hold for blood pressure less than 90). 30 tablet 0   solifenacin  (VESICARE ) 5 MG tablet Take 1 tablet (5 mg total) by mouth daily. 30 tablet 3   No current facility-administered medications for this visit.    Allergies:   Iodine and Shellfish allergy   Social History:  The patient  reports that he has never smoked. He has never used smokeless tobacco. He reports that he does not drink alcohol  and does not use drugs.   Family History:   family history includes CAD in his father; Polycystic kidney disease in his father.    Review of Systems: Review of Systems  Constitutional: Negative.   HENT:  Negative.    Respiratory: Negative.    Cardiovascular: Negative.   Gastrointestinal: Negative.   Musculoskeletal:  Positive for joint pain.       Gait instability  Neurological: Negative.  Negative for loss of consciousness.  Psychiatric/Behavioral: Negative.    All other systems reviewed and are negative.  PHYSICAL EXAM: VS:  BP 132/70 (BP Location: Left Arm,  Patient Position: Sitting, Cuff Size: Normal)   Pulse 61   Ht 5' 8 (1.727 m)   Wt 201 lb (91.2 kg)   SpO2 97%   BMI 30.56 kg/m  , BMI Body mass index is 30.56 kg/m. Constitutional:  oriented to person, place, and time. No distress.  HENT:  Head: Grossly normal Eyes:  no discharge. No scleral icterus.  Neck: No JVD, no carotid bruits  Cardiovascular: Regular rate and rhythm, no murmurs appreciated Pulmonary/Chest: Clear to auscultation bilaterally, no wheezes or rales Abdominal: Soft.  no distension.  no tenderness.  Musculoskeletal: Normal range of motion Neurological:  normal muscle tone. Coordination normal. No atrophy Skin: Skin warm and dry Psychiatric: normal affect, pleasant  Recent Labs: 06/07/2024: BUN 23; Creatinine, Ser 1.31; Hemoglobin 15.9; Platelets 297; Potassium 3.8; Sodium 139    Lipid Panel Lab Results  Component Value Date   CHOL 167 10/06/2017   HDL 37 (L) 10/06/2017   LDLCALC 104 (H) 10/06/2017   TRIG 128 10/06/2017     Wt Readings from Last 3 Encounters:  07/31/24 201 lb (91.2 kg)  06/20/24 201 lb (91.2 kg)  06/12/24 201 lb (91.2 kg)     ASSESSMENT AND PLAN:  Coronary artery disease of native artery of native heart with stable angina pectoris (HCC) Currently with no symptoms of angina. No further workup at this time. Continue current medication regimen.  Essential hypertension Will continue amlodipine  and atenolol  Off HCTZ and olmesartan  in the past for low blood pressure  Mixed hyperlipidemia Cholesterol is at goal on the current lipid regimen. No changes to the medications were made.  Diabetes type 2 with complications No regular exercise program,  Continues to work A1c 5.8  Non-ST elevation (NSTEMI) myocardial infarction (HCC) on aspirin ,  Cholesterol and diabetes at goal Non-smoker    Orders Placed This Encounter  Procedures   EKG 12-Lead     Signed, Velinda Lunger, M.D., Ph.D. 07/31/2024  [facility]  [place], [place] [phone]

## 2024-07-31 ENCOUNTER — Ambulatory Visit: Attending: Cardiovascular Disease | Admitting: Cardiovascular Disease

## 2024-07-31 ENCOUNTER — Encounter: Payer: Self-pay | Admitting: Cardiovascular Disease

## 2024-07-31 VITALS — BP 132/70 | HR 61 | Ht 68.0 in | Wt 201.0 lb

## 2024-07-31 DIAGNOSIS — E782 Mixed hyperlipidemia: Secondary | ICD-10-CM

## 2024-07-31 DIAGNOSIS — I25118 Atherosclerotic heart disease of native coronary artery with other forms of angina pectoris: Secondary | ICD-10-CM

## 2024-07-31 DIAGNOSIS — I252 Old myocardial infarction: Secondary | ICD-10-CM | POA: Diagnosis not present

## 2024-07-31 DIAGNOSIS — I214 Non-ST elevation (NSTEMI) myocardial infarction: Secondary | ICD-10-CM

## 2024-07-31 DIAGNOSIS — I10 Essential (primary) hypertension: Secondary | ICD-10-CM

## 2024-07-31 DIAGNOSIS — R0989 Other specified symptoms and signs involving the circulatory and respiratory systems: Secondary | ICD-10-CM

## 2024-07-31 MED ORDER — ATORVASTATIN CALCIUM 40 MG PO TABS: 40.0000 mg | ORAL_TABLET | Freq: Every day | ORAL | 3 refills | Status: DC

## 2024-07-31 MED ORDER — ATENOLOL 25 MG PO TABS: 25.0000 mg | ORAL_TABLET | Freq: Every day | ORAL | 3 refills | Status: DC

## 2024-07-31 MED ORDER — AMLODIPINE BESYLATE 10 MG PO TABS: 10.0000 mg | ORAL_TABLET | Freq: Every morning | ORAL | 3 refills | Status: DC

## 2024-07-31 NOTE — Patient Instructions (Signed)

## 2024-08-30 ENCOUNTER — Other Ambulatory Visit: Payer: Self-pay

## 2024-08-30 MED ORDER — SOLIFENACIN SUCCINATE 5 MG PO TABS: 5.0000 mg | ORAL_TABLET | Freq: Every day | ORAL | 2 refills | Status: DC

## 2024-09-14 ENCOUNTER — Ambulatory Visit: Admitting: Dermatology

## 2024-09-14 ENCOUNTER — Encounter: Payer: Self-pay | Admitting: Dermatology

## 2024-09-14 DIAGNOSIS — L570 Actinic keratosis: Secondary | ICD-10-CM | POA: Diagnosis not present

## 2024-09-14 DIAGNOSIS — L821 Other seborrheic keratosis: Secondary | ICD-10-CM

## 2024-09-14 DIAGNOSIS — D1801 Hemangioma of skin and subcutaneous tissue: Secondary | ICD-10-CM

## 2024-09-14 DIAGNOSIS — L814 Other melanin hyperpigmentation: Secondary | ICD-10-CM | POA: Diagnosis not present

## 2024-09-14 DIAGNOSIS — D492 Neoplasm of unspecified behavior of bone, soft tissue, and skin: Secondary | ICD-10-CM | POA: Diagnosis not present

## 2024-09-14 DIAGNOSIS — Z1283 Encounter for screening for malignant neoplasm of skin: Secondary | ICD-10-CM | POA: Diagnosis not present

## 2024-09-14 DIAGNOSIS — Z86018 Personal history of other benign neoplasm: Secondary | ICD-10-CM

## 2024-09-14 DIAGNOSIS — Z85828 Personal history of other malignant neoplasm of skin: Secondary | ICD-10-CM

## 2024-09-14 DIAGNOSIS — C4442 Squamous cell carcinoma of skin of scalp and neck: Secondary | ICD-10-CM

## 2024-09-14 DIAGNOSIS — L578 Other skin changes due to chronic exposure to nonionizing radiation: Secondary | ICD-10-CM | POA: Diagnosis not present

## 2024-09-14 DIAGNOSIS — W908XXA Exposure to other nonionizing radiation, initial encounter: Secondary | ICD-10-CM | POA: Diagnosis not present

## 2024-09-14 DIAGNOSIS — D171 Benign lipomatous neoplasm of skin and subcutaneous tissue of trunk: Secondary | ICD-10-CM

## 2024-09-14 DIAGNOSIS — D229 Melanocytic nevi, unspecified: Secondary | ICD-10-CM

## 2024-09-14 NOTE — Patient Instructions (Addendum)

## 2024-09-14 NOTE — Progress Notes (Signed)
 Follow-Up Visit   Subjective  Gregory [name] is a 83 y.o. male who presents for the following: Skin Cancer Screening and Full Body Skin Exam, hx of Melanoma IS, BCC, SCC, Dysplastic Nevi, check spots bil temples irritating  The patient presents for Total-Body Skin Exam (TBSE) for skin cancer screening and mole check. The patient has spots, moles and lesions to be evaluated, some may be new or changing and the patient may have concern these could be cancer.    The following portions of the chart were reviewed this encounter and updated as appropriate: medications, allergies, medical history  Review of Systems:  No other skin or systemic complaints except as noted in HPI or Assessment and Plan.  Objective  Well appearing patient in no apparent distress; mood and affect are within normal limits.  A full examination was performed including scalp, head, eyes, ears, nose, lips, neck, chest, axillae, abdomen, back, buttocks, bilateral upper extremities, bilateral lower extremities, hands, feet, fingers, toes, fingernails, and toenails. All findings within normal limits unless otherwise noted below.   Relevant physical exam findings are noted in the Assessment and Plan.  R upper back base of neck  L temple x 3, L forehead x 2, central forehad x 1, scalp x 5, superior to R brow x 3, R preauricular x 1, R post helix x 1, (16) Pink scaly macules L lat neck 5.56mm pink pap   Assessment & Plan   SKIN CANCER SCREENING PERFORMED TODAY.  ACTINIC DAMAGE - Chronic condition, secondary to cumulative UV/sun exposure - diffuse scaly erythematous macules with underlying dyspigmentation - Recommend daily broad spectrum sunscreen SPF 30+ to sun-exposed areas, reapply every 2 hours as needed.  - Staying in the shade or wearing long sleeves, sun glasses (UVA+UVB protection) and wide brim hats (4-inch brim around the entire circumference of the hat) are also recommended for sun protection.  - Call for  new or changing lesions.  LENTIGINES, SEBORRHEIC KERATOSES, HEMANGIOMAS - Benign normal skin lesions - Benign-appearing - Call for any changes - SK R temple, L temple  MELANOCYTIC NEVI - Tan-brown and/or pink-flesh-colored symmetric macules and papules - Benign appearing on exam today - Observation - Call clinic for new or changing moles - Recommend daily use of broad spectrum spf 30+ sunscreen to sun-exposed areas.   HISTORY OF MELANOMA IN SITU - No evidence of recurrence today - Recommend regular full body skin exams - Recommend daily broad spectrum sunscreen SPF 30+ to sun-exposed areas, reapply every 2 hours as needed.  - Call if any new or changing lesions are noted between office visits  - R vertex scalp, 2019  HISTORY OF BASAL CELL CARCINOMA OF THE SKIN - No evidence of recurrence today - Recommend regular full body skin exams - Recommend daily broad spectrum sunscreen SPF 30+ to sun-exposed areas, reapply every 2 hours as needed.  - Call if any new or changing lesions are noted between office visits  -L neck txted 10/03/2009  HISTORY OF SQUAMOUS CELL CARCINOMA OF THE SKIN - No evidence of recurrence today - No lymphadenopathy - Recommend regular full body skin exams - Recommend daily broad spectrum sunscreen SPF 30+ to sun-exposed areas, reapply every 2 hours as needed.  - Call if any new or changing lesions are noted between office visits - R post ear 07/27/2018   HISTORY OF DYSPLASTIC NEVUS No evidence of recurrence today Recommend regular full body skin exams Recommend daily broad spectrum sunscreen SPF 30+ to sun-exposed areas, reapply  every 2 hours as needed.  Call if any new or changing lesions are noted between office visits  - R vertex bordering on early evolving Melanoma IS 2009, R vertex bordering on early evolving Melanoma IS 2011, R mid back 2024  HISTORY SOLAR LENTIGO, WITH SCATTERED ATYPICAL MELANOCYTES  Bx proven 09/13/23 L zygoma Exam: clear  today  Treatment Plan: Clear today, observe for changes   Lipoma  R upper back at base of neck Exam: Subcutaneous rubbery nodule 6.5 x 8.5cm Location: R upper back at base of neck  Benign-appearing. Exam most consistent with an Lipoma. Discussed that a Lipoma is a benign fatty growth that can grow over time and sometimes become painful or otherwise symptomatic. Some patients may have one or several lipomas.. Benign Hereditary Lipomatosis is a hereditary familial condition where family members tend to grow multiple lipomas.  Recommend observation if it is not changing, growing or symptomatic. Recommend surgical excision to remove it if it is painful, growing, symptomatic, or other changes noted. Please contact our office for new or changing lesions so they can be evaluated.  Discussed referral to Dr. Corey or General surgeon, pt prefers Dr. Corey Certain pt we would discuss with Dr. Corey and get back with him about scheduling.   AK (ACTINIC KERATOSIS) (16) L temple x 3, L forehead x 2, central forehad x 1, scalp x 5, superior to R brow x 3, R preauricular x 1, R post helix x 1, (16) Actinic keratoses are precancerous spots that appear secondary to cumulative UV radiation exposure/sun exposure over time. They are chronic with expected duration over 1 year. A portion of actinic keratoses will progress to squamous cell carcinoma of the skin. It is not possible to reliably predict which spots will progress to skin cancer and so treatment is recommended to prevent development of skin cancer.  Recommend daily broad spectrum sunscreen SPF 30+ to sun-exposed areas, reapply every 2 hours as needed.  Recommend staying in the shade or wearing long sleeves, sun glasses (UVA+UVB protection) and wide brim hats (4-inch brim around the entire circumference of the hat). Call for new or changing lesions. Destruction of lesion - L temple x 3, L forehead x 2, central forehad x 1, scalp x 5, superior to R brow x 3, R  preauricular x 1, R post helix x 1, (16) Complexity: simple   Destruction method: cryotherapy   Informed consent: discussed and consent obtained   Timeout:  patient name, date of birth, surgical site, and procedure verified Lesion destroyed using liquid nitrogen: Yes   Region frozen until ice ball extended beyond lesion: Yes   Cryo cycles: 1 or 2. Outcome: patient tolerated procedure well with no complications   Post-procedure details: wound care instructions given    NEOPLASM OF SKIN L lat neck Skin / nail biopsy Type of biopsy: tangential   Informed consent: discussed and consent obtained   Timeout: patient name, date of birth, surgical site, and procedure verified   Procedure prep:  Patient was prepped and draped in usual sterile fashion Prep type:  Isopropyl alcohol  Anesthesia: the lesion was anesthetized in a standard fashion   Anesthetic:  1% lidocaine  w/ epinephrine  1-100,000 buffered w/ 8.4% NaHCO3 Instrument used: DermaBlade   Hemostasis achieved with: pressure and aluminum chloride   Outcome: patient tolerated procedure well   Post-procedure details: sterile dressing applied and wound care instructions given   Dressing type: bandage and petrolatum    Specimen 1 - Surgical pathology Differential Diagnosis: R/O  BCC, SCC  Check Margins: No 5.51mm pink pap MULTIPLE BENIGN NEVI   LENTIGINES   ACTINIC ELASTOSIS   CHERRY ANGIOMA   SEBORRHEIC KERATOSES   LIPOMA OF TORSO    Return in about 6 months (around 03/14/2025) for TBSE, Hx of Melanoma IS, Hx of BCC, SCDysplastic nevi, Hx of AKs.  I, Grayce Saunas, RMA, am acting as scribe for Boneta Sharps, MD .   Documentation: I have reviewed the above documentation for accuracy and completeness, and I agree with the above.  Boneta Sharps, MD

## 2024-09-15 LAB — SURGICAL PATHOLOGY

## 2024-09-18 ENCOUNTER — Ambulatory Visit: Payer: Self-pay | Admitting: Dermatology

## 2024-09-18 ENCOUNTER — Encounter: Payer: Self-pay | Admitting: Dermatology

## 2024-09-18 DIAGNOSIS — C4492 Squamous cell carcinoma of skin, unspecified: Secondary | ICD-10-CM

## 2024-09-18 HISTORY — DX: Squamous cell carcinoma of skin, unspecified: C44.92

## 2024-09-18 NOTE — Telephone Encounter (Signed)
 Patient advised pathology results. He will let us  know if he prefers [facility] or Mohs with Dr. Bluford. Lonell RAMAN., RMA

## 2024-09-18 NOTE — Telephone Encounter (Signed)
-----   Message from [place] sent at 09/18/2024  4:20 AM EDT ----- Diagnosis L lat neck :       WELL DIFFERENTIATED SQUAMOUS CELL CARCINOMA WITH SUPERFICIAL INFILTRATION, SEE       DESCRIPTION    Please call with diagnosis and message me with patient's decision on treatment.   Explanation: Biopsy shows an invasive squamous cell skin cancer. it has the potential to spread beyond the skin and threaten your health, so we recommend treating it.   Treatment option 1: you return for a brief appointment where I perform electrodesiccation and curettage [facility]). This involves three rounds of scraping and burning to destroy the skin cancer. It has  about an 85% cure rate and leaves a round wound slightly larger than the skin cancer and leaves a round white scar. No additional pathology is done. If the skin cancer comes back, we would need to do  a surgery to remove it.  Treatment option 2: Mohs surgery, which involves cutting out right around the skin cancer and then checking under the microscope on the same day to ensure the whole skin cancer is out. If there is  more cancer remaining, the surgeon will repeat the process until it is fully cleared. The cure rate is about 98-99%. It is done at another office outside of [place] ([place], [place], or  [place]). Once the Mohs surgeon confirms the skin cancer is out, they will discuss the options to repair or heal the area. You must take it easy for about two weeks after surgery (no lifting over  10-15 lbs, avoid activity to get your heart rate and blood pressure up). ----- Message ----- From: Interface, Lab In [place] Sent: 09/15/2024   4:21 PM EDT To: Boneta Sharps, MD

## 2024-09-21 ENCOUNTER — Encounter: Payer: Self-pay | Admitting: Dermatology

## 2024-09-21 NOTE — Telephone Encounter (Signed)
-----   Message from [place] sent at 09/18/2024  4:20 AM EDT ----- Diagnosis L lat neck :       WELL DIFFERENTIATED SQUAMOUS CELL CARCINOMA WITH SUPERFICIAL INFILTRATION, SEE       DESCRIPTION    Please call with diagnosis and message me with patient's decision on treatment.   Explanation: Biopsy shows an invasive squamous cell skin cancer. it has the potential to spread beyond the skin and threaten your health, so we recommend treating it.   Treatment option 1: you return for a brief appointment where I perform electrodesiccation and curettage [facility]). This involves three rounds of scraping and burning to destroy the skin cancer. It has  about an 85% cure rate and leaves a round wound slightly larger than the skin cancer and leaves a round white scar. No additional pathology is done. If the skin cancer comes back, we would need to do  a surgery to remove it.  Treatment option 2: Mohs surgery, which involves cutting out right around the skin cancer and then checking under the microscope on the same day to ensure the whole skin cancer is out. If there is  more cancer remaining, the surgeon will repeat the process until it is fully cleared. The cure rate is about 98-99%. It is done at another office outside of [place] ([place], [place], or  [place]). Once the Mohs surgeon confirms the skin cancer is out, they will discuss the options to repair or heal the area. You must take it easy for about two weeks after surgery (no lifting over  10-15 lbs, avoid activity to get your heart rate and blood pressure up). ----- Message ----- From: Interface, Lab In [facility] [place] Sent: 09/15/2024   4:21 PM EDT To: Boneta Sharps, MD

## 2024-09-21 NOTE — Telephone Encounter (Signed)
 Patient called he would like EDC of biopsy proven SCC on the left neck  Scheduled patient with Dr Claudene Nov 11 at 10 am

## 2024-10-03 NOTE — Progress Notes (Unsigned)
 10/04/2024 3:42 PM   Gregory [name] [birth year] [id number]  Referring provider: Sadie Manna, MD [address] [facility] [place],  [place] [address]  Urological history: 1.  Nephrolithiasis -Stone analysis 97% calcium  oxalate and 3% calcium  phosphate - left URS  (2017) -RUS (04/2024) left renal stone  2.  Bladder cancer -TURBT (2012) PUNLMP -TURBT (06/2024)   3.  Prostate cancer -PSA(01/2024)0.02 -prostatectomy(2013)  4.SUI/urgency -Vesicare  5 mg daily  5.Renal lesion - RUS (2025) 2.3 cm left upper pole renal mass   Chief Complaint  Patient presents with   Urinary Frequency    HPI: Gregory [name] is a 83 y.o. man who presents today for frequency.   Previous records reviewed.  I PSS 13/4  Over the weekend, he developed the sudden onset of frequency and urgency.  He also had 1 episode of gross hematuria.  He started AZO which seemed to suppress it a little bit.  Patient denies any modifying or aggravating factors.  Patient denies any recent UTI's, gross hematuria, dysuria or suprapubic/flank pain.  Patient denies any fevers, chills, nausea or vomiting.    UA orange cloudy, specific Auty 1.015, pH 6.0, trace glucose, 3+ protein, 3+ blood, nitrate positive, 2+ leukocyte, greater than 30 WBCs, 11-30 RBCs, 0-10 epithelial cells, granular casts present, mucus threads present and many bacteria  PVR 24 mL  PSA (01/2024) 0.02  Serum creatinine (07/2024) 1.1, eGFR 67  Hemoglobin A1c (07/2024) 5.8  He is currently on Vesicare  5 mg daily, but he does not feel that its effective.  He even felt this way before this weekend and he was again asked Dr. Twylla at the cystoscopy visit in November if we could increase the dose of the Vesicare .   PMH: Past Medical History:  Diagnosis Date   Actinic keratosis    Aortic atherosclerosis    Aortic root dilatation 10/06/2017   a.) TTE 10/06/2017: Ao root 41mm.   Arthritis    Basal cell  carcinoma 08/21/2009   Left neck. Excised: 10/03/2009, margins free.   Bladder tumor    Coronary artery disease 10/06/2017   a.) LHC 10/06/2017: 60% D2, 100% 2nd RPLB, 60% 3rd RPLB, 40% pLAD, 40% mLAD, 60% OM1 - med mgmt   Diastolic dysfunction 10/06/2017   Diverticulosis    Dysplastic nevus 08/23/2008   Right vertex. Moderate to severe atypia, bordering on early evolving MMis.   Dysplastic nevus 02/21/2010   Right vertex. Moderate to severe atypia, bordering on early evolving MMis.   Dysplastic nevus 03/14/2023   Right mid back. Mild atypia, limited margins free.   GERD (gastroesophageal reflux disease)    GI bleeding 02/2023   History of acoustic neuroma 02/2004   a.) s/p crainotomy for removal --> complicated by CSF leak requiring 2nd crainotomy for internal auditory canal closure, closure or dura, and placement of lumbar drain on 06/13/2004   History of craniotomy    History of kidney stones    HLD (hyperlipidemia)    Hypertension    Left-sided weakness    Residual from brain tumor- removed 2005   Lentigo 09/13/2023   SOLAR LENTIGO, WITH SCATTERED ATYPICAL MELANOCYTES, L zygoma   Long-term use of aspirin  therapy    Melanoma in situ (HCC) 2019   right vertex scalp   NSTEMI (non-ST elevated myocardial infarction) (HCC) 10/06/2017   a.) LHC 10/06/2017: 60% D2, 100% 2nd RPLB, 60% 3rd RPLB, 40% pLAD, 40% mLAD, 60% OM1; intervention deferred opting for medical management   OSA (obstructive  sleep apnea)    a.) does not require nocturnal PAP therapy   Prostate cancer (s/p resection)    Rheumatic fever    SCC (squamous cell carcinoma) 09/18/2024   left lateral  neck, EDC vs Mohs   Squamous cell carcinoma of skin 07/27/2018   Right posterior ear. WD SCC. EDC    Surgical History: Past Surgical History:  Procedure Laterality Date   BLADDER INSTILLATION N/A 06/20/2024   Procedure: INSTILLATION, BLADDER;  Surgeon: Twylla Glendia BROCKS, MD;  Location: [facility];  Service: Urology;   Laterality: N/A;  GEMCITABINE    BLADDER SURGERY  12/21/2010   Dr. Patrina, [facility]   CATARACT EXTRACTION W/PHACO Left 04/05/2024   Procedure: CATARACT EXTRACTION PHACO AND INTRAOCULAR LENS PLACEMENT (IOC) LEFT MALYUGIN 10.62 00:43.5;  Surgeon: Mittie Gaskin, MD;  Location: [facility];  Service: Ophthalmology;  Laterality: Left;   CHOLECYSTECTOMY     COLONOSCOPY WITH PROPOFOL  N/A 03/05/2023   Procedure: COLONOSCOPY WITH PROPOFOL ;  Surgeon: Maryruth Ole DASEN, MD;  Location: [facility];  Service: Endoscopy;  Laterality: N/A;   CRANIOTOMY Left 02/2004   Procedure: CRANIOTOMY FOR ACOUSTIC NEUROMA REMOVAL; Location: [facility]   CRANIOTOMY FOR REPAIR DURAL / CSF LEAK N/A 06/13/2004   Procedure: SECONDARY SUBOCCIPITAL CRANIOTOMY WITH CLOSURE OF INTERNAL AUDITORY CANAL, CLOSURE OF DURA, AND PLACEMENT OF LUMBAR DRAIN; Location: [facility]   CYSTOSCOPY W/ RETROGRADES Bilateral 06/20/2024   Procedure: CYSTOSCOPY, WITH RETROGRADE PYELOGRAM;  Surgeon: Twylla Glendia BROCKS, MD;  Location: [facility];  Service: Urology;  Laterality: Bilateral;   CYSTOSCOPY WITH RETROGRADE URETHROGRAM  03/16/2016   Procedure: CYSTOSCOPY WITH RETROGRADE URETHROGRAM;  Surgeon: Rosina Riis, MD;  Location: [facility];  Service: Urology;;   CYSTOSCOPY WITH STENT PLACEMENT Left 02/23/2016   Procedure: CYSTOSCOPY WITH STENT PLACEMENT;  Surgeon: Zina JINNY Irwin, MD;  Location: [facility];  Service: Urology;  Laterality: Left;   CYSTOSCOPY WITH STENT PLACEMENT Left 03/16/2016   Procedure: CYSTOSCOPY WITH STENT PLACEMENT/EXCHANGE;  Surgeon: Rosina Riis, MD;  Location: [facility];  Service: Urology;  Laterality: Left;   LEFT HEART CATH AND CORONARY ANGIOGRAPHY N/A 10/06/2017   Procedure: LEFT HEART CATH AND CORONARY ANGIOGRAPHY;  Surgeon: Perla Evalene JINNY, MD;  Location: [facility];  Service: Cardiovascular;  Laterality: N/A;   MELANOMA EXCISION     PROSTATECTOMY  12/21/2001   Dr. Patrina, [facility]   SHOULDER ARTHROSCOPY  WITH SUBACROMIAL DECOMPRESSION, ROTATOR CUFF REPAIR AND BICEP TENDON REPAIR Left 02/05/2022   Procedure: SHOULDER ARTHROSCOPY WITH EXTENSIVE DEBRIDEMENT, DECOMPRESSION,;  Surgeon: Edie Norleen JINNY, MD;  Location: [facility];  Service: Orthopedics;  Laterality: Left;   STONE EXTRACTION WITH BASKET  03/16/2016   Procedure: STONE EXTRACTION WITH BASKET;  Surgeon: Rosina Riis, MD;  Location: [facility];  Service: Urology;;   TRANSURETHRAL RESECTION OF BLADDER TUMOR N/A 06/20/2024   Procedure: TURBT (TRANSURETHRAL RESECTION OF BLADDER TUMOR);  Surgeon: Twylla Glendia BROCKS, MD;  Location: [facility];  Service: Urology;  Laterality: N/A;    Home Medications:  Allergies as of 10/04/2024       Reactions   Iodine Anaphylaxis   Shellfish Allergy Anaphylaxis        Medication List        Accurate as of October 04, 2024  3:42 PM. If you have any questions, ask your nurse or doctor.          amLODipine  10 MG tablet Commonly known as: NORVASC  Take 1 tablet (10 mg total) by mouth every morning.   aspirin  EC 81 MG tablet Take 1  tablet (81 mg total) by mouth daily. Swallow whole.   atenolol  25 MG tablet Commonly known as: TENORMIN  Take 1 tablet (25 mg total) by mouth daily.   atorvastatin  40 MG tablet Commonly known as: LIPITOR Take 1 tablet (40 mg total) by mouth daily.   cefUROXime  250 MG tablet Commonly known as: CEFTIN  Take 1 tablet (250 mg total) by mouth 2 (two) times daily with a meal for 7 days. Started by: CLOTILDA CORNWALL   latanoprost 0.005 % ophthalmic solution Commonly known as: XALATAN Place 1 drop into both eyes at bedtime.   nitroGLYCERIN  0.4 MG SL tablet Commonly known as: NITROSTAT  Place 1 tablet (0.4 mg total) under the tongue every 5 (five) minutes as needed for chest pain (hold for blood pressure less than 90).   solifenacin  10 MG tablet Commonly known as: VESICARE  Take 1 tablet (10 mg total) by mouth daily. What changed:  medication strength how much to  take Changed by: CLOTILDA CORNWALL        Allergies:  Allergies  Allergen Reactions   Iodine Anaphylaxis   Shellfish Allergy Anaphylaxis    Family History: Family History  Problem Relation Age of Onset   CAD Father    Polycystic kidney disease Father    Diabetes Mellitus II Neg Hx    Hypertension Neg Hx     Social History:  reports that he has never smoked. He has never used smokeless tobacco. He reports that he does not drink alcohol  and does not use drugs.  ROS: Pertinent ROS in HPI  Physical Exam: BP (!) 154/81   Pulse 85   Ht 5' 8 (1.727 m)   Wt 200 lb (90.7 kg)   SpO2 92%   BMI 30.41 kg/m   Constitutional:  Well nourished. Alert and oriented, No acute distress. HEENT: [place] AT, moist mucus membranes.  Trachea midline Cardiovascular: No clubbing, cyanosis, or edema. Respiratory: Normal respiratory effort, no increased work of breathing. Neurologic: Grossly intact, no focal deficits, moving all 4 extremities. Psychiatric: Normal mood and affect.  Laboratory Data: See Epic and HPI   I have reviewed the labs.   Pertinent Imaging:  10/04/24 14:59  Scan Result 24ml     Assessment & Plan:    1.Frequency -UA concerning for UTI -Urine sent for culture -Started on Ceftin  250 mg empirically, will adjust once necessary once urine culture results are available -Advised patient if he did not feel better after 72 hours on the antibiotic to call the office -PVR demonstrates adequate emptying - I will go ahead and increase the Vesicare  to 10 mg daily to see if he can get better control over his OAB  2. Bladder cancer - Has appointment with Dr. Twylla next month  Return for keep appointment wtih Dr. Twylla .  These notes generated with voice recognition software. I apologize for typographical errors.  CLOTILDA CORNWALL RIGGERS  [facility] [address]  [address] [place], [place] [address] [phone]

## 2024-10-04 ENCOUNTER — Ambulatory Visit: Admitting: Urology

## 2024-10-04 ENCOUNTER — Encounter: Payer: Self-pay | Admitting: Urology

## 2024-10-04 VITALS — BP 154/81 | HR 85 | Ht 68.0 in | Wt 200.0 lb

## 2024-10-04 DIAGNOSIS — R350 Frequency of micturition: Secondary | ICD-10-CM

## 2024-10-04 DIAGNOSIS — C679 Malignant neoplasm of bladder, unspecified: Secondary | ICD-10-CM | POA: Diagnosis not present

## 2024-10-04 LAB — URINALYSIS, COMPLETE
Bilirubin, UA: NEGATIVE
Nitrite, UA: POSITIVE — AB
Specific Gravity, UA: 1.015 (ref 1.005–1.030)
Urobilinogen, Ur: 4 mg/dL — ABNORMAL HIGH (ref 0.2–1.0)
pH, UA: 6 (ref 5.0–7.5)

## 2024-10-04 LAB — MICROSCOPIC EXAMINATION: WBC, UA: >30 /HPF — AB (ref 0–5)

## 2024-10-04 LAB — BLADDER SCAN AMB NON-IMAGING

## 2024-10-04 MED ORDER — CEFUROXIME AXETIL 250 MG PO TABS: 250.0000 mg | ORAL_TABLET | Freq: Two times a day (BID) | ORAL | 0 refills | Status: AC

## 2024-10-04 MED ORDER — SOLIFENACIN SUCCINATE 10 MG PO TABS: 10.0000 mg | ORAL_TABLET | Freq: Every day | ORAL | 3 refills | Status: AC

## 2024-10-10 ENCOUNTER — Ambulatory Visit: Payer: Self-pay | Admitting: Urology

## 2024-10-10 LAB — CULTURE, URINE COMPREHENSIVE

## 2024-10-27 ENCOUNTER — Other Ambulatory Visit: Admitting: Urology

## 2024-10-30 ENCOUNTER — Ambulatory Visit: Admitting: Urology

## 2024-10-30 ENCOUNTER — Encounter: Payer: Self-pay | Admitting: Urology

## 2024-10-30 VITALS — BP 134/70 | HR 65 | Ht 68.0 in | Wt 196.8 lb

## 2024-10-30 DIAGNOSIS — D494 Neoplasm of unspecified behavior of bladder: Secondary | ICD-10-CM

## 2024-10-30 LAB — MICROSCOPIC EXAMINATION: WBC, UA: >30 /HPF — AB (ref 0–5)

## 2024-10-30 LAB — URINALYSIS, COMPLETE
Glucose, UA: NEGATIVE
Nitrite, UA: NEGATIVE
Specific Gravity, UA: 1.025 (ref 1.005–1.030)
Urobilinogen, Ur: 1 mg/dL (ref 0.2–1.0)
pH, UA: 6 (ref 5.0–7.5)

## 2024-10-30 NOTE — Progress Notes (Signed)
 Presents today for follow-up cystoscopy however urinalysis with >30 WBC.  Urine culture was ordered and cystoscopy will be rescheduled

## 2024-10-31 ENCOUNTER — Ambulatory Visit (INDEPENDENT_AMBULATORY_CARE_PROVIDER_SITE_OTHER): Admitting: Dermatology

## 2024-10-31 ENCOUNTER — Encounter: Payer: Self-pay | Admitting: Dermatology

## 2024-10-31 DIAGNOSIS — Z08 Encounter for follow-up examination after completed treatment for malignant neoplasm: Secondary | ICD-10-CM

## 2024-10-31 DIAGNOSIS — Z85828 Personal history of other malignant neoplasm of skin: Secondary | ICD-10-CM

## 2024-10-31 DIAGNOSIS — C4492 Squamous cell carcinoma of skin, unspecified: Secondary | ICD-10-CM

## 2024-10-31 NOTE — Progress Notes (Signed)
   Follow-Up Visit   Subjective  Gregory [name] is a 83 y.o. male who presents for the following: EDC for bx proven SCC at left lateral neck.    The following portions of the chart were reviewed this encounter and updated as appropriate: medications, allergies, medical history  Review of Systems:  No other skin or systemic complaints except as noted in HPI or Assessment and Plan.  Objective  Well appearing patient in no apparent distress; mood and affect are within normal limits.   A focused examination was performed of the following areas: neck  Relevant exam findings are noted in the Assessment and Plan.    Assessment & Plan   HISTORY OF SQUAMOUS CELL CARCINOMA OF THE SKIN - No evidence of recurrence today at left lateral neck. Pink well-healed scar - No lymphadenopathy - Recommend regular full body skin exams - Recommend daily broad spectrum sunscreen SPF 30+ to sun-exposed areas, reapply every 2 hours as needed.  - Call if any new or changing lesions are noted between office visits       Return for as scheduled, TBSE, with Dr. Claudene, [facility], HxDN, HxMMis, HxSCC.  LILLETTE Lonell Drones, RMA, am acting as scribe for Boneta Claudene, MD .   Documentation: I have reviewed the above documentation for accuracy and completeness, and I agree with the above.  Boneta Claudene, MD

## 2024-10-31 NOTE — Patient Instructions (Signed)

## 2024-11-03 LAB — CULTURE, URINE COMPREHENSIVE

## 2024-11-06 ENCOUNTER — Ambulatory Visit: Payer: Self-pay | Admitting: Urology

## 2024-11-06 ENCOUNTER — Other Ambulatory Visit: Payer: Self-pay | Admitting: *Deleted

## 2024-11-06 MED ORDER — CEFUROXIME AXETIL 250 MG PO TABS: 250.0000 mg | ORAL_TABLET | Freq: Two times a day (BID) | ORAL | 0 refills | Status: AC

## 2024-11-10 ENCOUNTER — Encounter: Payer: Self-pay | Admitting: Urology

## 2024-11-10 ENCOUNTER — Ambulatory Visit: Admitting: Urology

## 2024-11-10 VITALS — BP 148/74 | HR 67 | Ht 68.0 in | Wt 197.4 lb

## 2024-11-10 DIAGNOSIS — D494 Neoplasm of unspecified behavior of bladder: Secondary | ICD-10-CM

## 2024-11-10 LAB — URINALYSIS, COMPLETE
Bilirubin, UA: NEGATIVE
Glucose, UA: NEGATIVE
Leukocytes,UA: NEGATIVE
Nitrite, UA: NEGATIVE
RBC, UA: NEGATIVE
Specific Gravity, UA: 1.02 (ref 1.005–1.030)
Urobilinogen, Ur: 0.2 mg/dL (ref 0.2–1.0)
pH, UA: 6 (ref 5.0–7.5)

## 2024-11-10 LAB — MICROSCOPIC EXAMINATION
Epithelial Cells (non renal): >10 /HPF — AB (ref 0–10)
RBC, Urine: NONE SEEN /HPF (ref 0–2)

## 2024-11-10 NOTE — Progress Notes (Unsigned)
   11/10/24  CC:  Chief Complaint  Patient presents with   Cysto   Urologic history: 1.  Urothelial carcinoma bladder   HPI: No complaints.  Denies dysuria or gross hematuria.  UA today microscopy negative  Blood pressure (!) 148/74, pulse 67, height 5' 8 (1.727 m), weight 197 lb 6.4 oz (89.5 kg). NED. A&Ox3.   No respiratory distress   Abd soft, NT, ND Normal phallus with bilateral descended testicles  Cystoscopy Procedure Note  Patient identification was confirmed, informed consent was obtained, and patient was prepped using Betadine solution.  Lidocaine  jelly was administered per urethral meatus.     Pre-Procedure: - Inspection reveals a normal caliber urethral meatus.  Procedure: The flexible cystoscope was introduced without difficulty - No urethral strictures/lesions are present. - Surgically absent prostate  - Open bladder neck - Bilateral ureteral orifices identified - Bladder mucosa  reveals no ulcers, tumors, or lesions - No bladder stones - Mild trabeculation  Retroflexion shows no tumor   Post-Procedure: - Patient tolerated the procedure well  Assessment/ Plan: No evidence of recurrent bladder tumor Follow-up cystoscopy 4 months RUS 04/28/2024 showed a 2.3 cm possible mass superior pole of left kidney.  CT angio abdomen pelvis with and without contrast performed 03/04/2023 was reviewed and the mass was present on that study but may have enhancement. Recommend follow-up CT renal mass protocol or renal mass protocol MRI    Glendia JAYSON Barba, MD

## 2024-11-13 ENCOUNTER — Telehealth: Payer: Self-pay | Admitting: Urology

## 2024-11-13 ENCOUNTER — Other Ambulatory Visit: Payer: Self-pay | Admitting: *Deleted

## 2024-11-13 DIAGNOSIS — N2889 Other specified disorders of kidney and ureter: Secondary | ICD-10-CM

## 2024-11-13 NOTE — Telephone Encounter (Signed)
 Please let patient know his renal ultrasound performed May 2025 by Dr. Penne ordered showed a possible left renal mass.  Recommend further evaluation with a MRI abdomen with/without contrast (normal order put possible left renal mass seen on renal ultrasound)

## 2024-11-13 NOTE — Telephone Encounter (Signed)
 Notified patient as instructed, patient pleased. Will call with results

## 2024-11-23 ENCOUNTER — Ambulatory Visit: Admission: RE | Admit: 2024-11-23 | Discharge: 2024-11-23 | Attending: Urology | Admitting: Urology

## 2024-11-23 ENCOUNTER — Other Ambulatory Visit: Payer: Self-pay | Admitting: Urology

## 2024-11-23 DIAGNOSIS — N2889 Other specified disorders of kidney and ureter: Secondary | ICD-10-CM

## 2024-11-23 MED ORDER — GADOBUTROL 1 MMOL/ML IV SOLN
9.0000 mL | Freq: Once | INTRAVENOUS | Status: AC | PRN
  Administered 2024-11-23: 9 mL via INTRAVENOUS

## 2024-12-02 ENCOUNTER — Ambulatory Visit: Payer: Self-pay | Admitting: Urology

## 2024-12-22 ENCOUNTER — Encounter: Payer: Self-pay | Admitting: Cardiovascular Disease

## 2024-12-27 ENCOUNTER — Other Ambulatory Visit: Payer: Self-pay | Admitting: *Deleted

## 2024-12-27 ENCOUNTER — Encounter: Payer: Self-pay | Admitting: Cardiovascular Disease

## 2024-12-27 MED ORDER — ATENOLOL 25 MG PO TABS: 25.0000 mg | ORAL_TABLET | Freq: Every day | ORAL | 3 refills | Status: AC

## 2024-12-27 MED ORDER — AMLODIPINE BESYLATE 10 MG PO TABS: 10.0000 mg | ORAL_TABLET | Freq: Every morning | ORAL | 3 refills | Status: AC

## 2024-12-27 MED ORDER — ATORVASTATIN CALCIUM 40 MG PO TABS: 40.0000 mg | ORAL_TABLET | Freq: Every day | ORAL | 3 refills | Status: AC

## 2025-03-13 ENCOUNTER — Other Ambulatory Visit: Admitting: Urology

## 2025-03-15 ENCOUNTER — Ambulatory Visit: Admitting: Dermatology
